# Patient Record
Sex: Female | Born: 1935
Health system: Southern US, Community
[De-identification: ages and names within clinical notes are randomized; demographics above are authoritative.]

## PROBLEM LIST (undated history)

## (undated) DIAGNOSIS — C50912 Malignant neoplasm of unspecified site of left female breast: Secondary | ICD-10-CM

## (undated) DIAGNOSIS — J449 Chronic obstructive pulmonary disease, unspecified: Secondary | ICD-10-CM

## (undated) DIAGNOSIS — Z853 Personal history of malignant neoplasm of breast: Secondary | ICD-10-CM

## (undated) DIAGNOSIS — I10 Essential (primary) hypertension: Secondary | ICD-10-CM

## (undated) HISTORY — PX: ABDOMINAL AORTIC ANEURYSM REPAIR: SUR1152

## (undated) HISTORY — PX: OTHER SURGICAL HISTORY: SHX169

---

## 2003-11-21 ENCOUNTER — Ambulatory Visit: Payer: Self-pay | Admitting: Oncology

## 2004-02-07 ENCOUNTER — Ambulatory Visit: Payer: Self-pay

## 2004-04-12 ENCOUNTER — Ambulatory Visit: Payer: Self-pay | Admitting: Internal Medicine

## 2004-07-02 ENCOUNTER — Ambulatory Visit: Payer: Self-pay | Admitting: Oncology

## 2004-07-04 ENCOUNTER — Ambulatory Visit: Payer: Self-pay | Admitting: Oncology

## 2004-10-16 ENCOUNTER — Ambulatory Visit: Payer: Self-pay | Admitting: Oncology

## 2005-02-25 ENCOUNTER — Ambulatory Visit: Payer: Self-pay

## 2005-05-22 ENCOUNTER — Ambulatory Visit: Payer: Self-pay | Admitting: Gastroenterology

## 2005-11-24 ENCOUNTER — Ambulatory Visit: Payer: Self-pay | Admitting: Surgery

## 2005-12-01 ENCOUNTER — Ambulatory Visit: Payer: Self-pay | Admitting: Surgery

## 2006-01-19 ENCOUNTER — Ambulatory Visit: Payer: Self-pay | Admitting: Internal Medicine

## 2007-05-03 ENCOUNTER — Ambulatory Visit: Payer: Self-pay | Admitting: Internal Medicine

## 2007-05-12 ENCOUNTER — Ambulatory Visit: Payer: Self-pay | Admitting: Internal Medicine

## 2007-05-25 ENCOUNTER — Ambulatory Visit: Payer: Self-pay | Admitting: Internal Medicine

## 2008-05-01 ENCOUNTER — Emergency Department: Payer: Self-pay | Admitting: Emergency Medicine

## 2009-05-31 ENCOUNTER — Ambulatory Visit: Payer: Self-pay | Admitting: Internal Medicine

## 2009-11-12 ENCOUNTER — Ambulatory Visit: Payer: Self-pay | Admitting: Internal Medicine

## 2011-02-25 DIAGNOSIS — R0609 Other forms of dyspnea: Secondary | ICD-10-CM | POA: Diagnosis not present

## 2011-02-25 DIAGNOSIS — I10 Essential (primary) hypertension: Secondary | ICD-10-CM | POA: Diagnosis not present

## 2011-02-25 DIAGNOSIS — E782 Mixed hyperlipidemia: Secondary | ICD-10-CM | POA: Diagnosis not present

## 2011-02-25 DIAGNOSIS — E039 Hypothyroidism, unspecified: Secondary | ICD-10-CM | POA: Diagnosis not present

## 2011-02-25 DIAGNOSIS — J309 Allergic rhinitis, unspecified: Secondary | ICD-10-CM | POA: Diagnosis not present

## 2011-02-25 DIAGNOSIS — R011 Cardiac murmur, unspecified: Secondary | ICD-10-CM | POA: Diagnosis not present

## 2011-02-25 DIAGNOSIS — R0989 Other specified symptoms and signs involving the circulatory and respiratory systems: Secondary | ICD-10-CM | POA: Diagnosis not present

## 2011-02-25 DIAGNOSIS — Z853 Personal history of malignant neoplasm of breast: Secondary | ICD-10-CM | POA: Diagnosis not present

## 2011-02-25 DIAGNOSIS — D518 Other vitamin B12 deficiency anemias: Secondary | ICD-10-CM | POA: Diagnosis not present

## 2011-02-28 DIAGNOSIS — I519 Heart disease, unspecified: Secondary | ICD-10-CM | POA: Diagnosis not present

## 2011-04-28 DIAGNOSIS — J449 Chronic obstructive pulmonary disease, unspecified: Secondary | ICD-10-CM | POA: Diagnosis not present

## 2011-04-28 DIAGNOSIS — I519 Heart disease, unspecified: Secondary | ICD-10-CM | POA: Diagnosis not present

## 2011-04-28 DIAGNOSIS — E782 Mixed hyperlipidemia: Secondary | ICD-10-CM | POA: Diagnosis not present

## 2011-04-28 DIAGNOSIS — J309 Allergic rhinitis, unspecified: Secondary | ICD-10-CM | POA: Diagnosis not present

## 2011-04-28 DIAGNOSIS — R0902 Hypoxemia: Secondary | ICD-10-CM | POA: Diagnosis not present

## 2011-08-14 DIAGNOSIS — E039 Hypothyroidism, unspecified: Secondary | ICD-10-CM | POA: Diagnosis not present

## 2011-08-14 DIAGNOSIS — L03119 Cellulitis of unspecified part of limb: Secondary | ICD-10-CM | POA: Diagnosis not present

## 2011-08-14 DIAGNOSIS — L02419 Cutaneous abscess of limb, unspecified: Secondary | ICD-10-CM | POA: Diagnosis not present

## 2011-08-14 DIAGNOSIS — I10 Essential (primary) hypertension: Secondary | ICD-10-CM | POA: Diagnosis not present

## 2011-08-14 DIAGNOSIS — E559 Vitamin D deficiency, unspecified: Secondary | ICD-10-CM | POA: Diagnosis not present

## 2011-08-14 DIAGNOSIS — D518 Other vitamin B12 deficiency anemias: Secondary | ICD-10-CM | POA: Diagnosis not present

## 2011-08-14 DIAGNOSIS — Z79899 Other long term (current) drug therapy: Secondary | ICD-10-CM | POA: Diagnosis not present

## 2011-09-15 DIAGNOSIS — E039 Hypothyroidism, unspecified: Secondary | ICD-10-CM | POA: Diagnosis not present

## 2011-09-15 DIAGNOSIS — D518 Other vitamin B12 deficiency anemias: Secondary | ICD-10-CM | POA: Diagnosis not present

## 2011-09-15 DIAGNOSIS — L02419 Cutaneous abscess of limb, unspecified: Secondary | ICD-10-CM | POA: Diagnosis not present

## 2011-09-15 DIAGNOSIS — I10 Essential (primary) hypertension: Secondary | ICD-10-CM | POA: Diagnosis not present

## 2011-09-29 DIAGNOSIS — D1739 Benign lipomatous neoplasm of skin and subcutaneous tissue of other sites: Secondary | ICD-10-CM | POA: Diagnosis not present

## 2011-09-29 DIAGNOSIS — D239 Other benign neoplasm of skin, unspecified: Secondary | ICD-10-CM | POA: Diagnosis not present

## 2011-09-29 DIAGNOSIS — D235 Other benign neoplasm of skin of trunk: Secondary | ICD-10-CM | POA: Diagnosis not present

## 2011-09-29 DIAGNOSIS — C4491 Basal cell carcinoma of skin, unspecified: Secondary | ICD-10-CM | POA: Diagnosis not present

## 2011-10-13 DIAGNOSIS — R42 Dizziness and giddiness: Secondary | ICD-10-CM | POA: Diagnosis not present

## 2011-10-13 DIAGNOSIS — R55 Syncope and collapse: Secondary | ICD-10-CM | POA: Diagnosis not present

## 2011-10-13 DIAGNOSIS — E538 Deficiency of other specified B group vitamins: Secondary | ICD-10-CM | POA: Diagnosis not present

## 2011-10-13 DIAGNOSIS — I9589 Other hypotension: Secondary | ICD-10-CM | POA: Diagnosis not present

## 2011-11-17 DIAGNOSIS — J449 Chronic obstructive pulmonary disease, unspecified: Secondary | ICD-10-CM | POA: Diagnosis not present

## 2011-11-17 DIAGNOSIS — R42 Dizziness and giddiness: Secondary | ICD-10-CM | POA: Diagnosis not present

## 2011-11-17 DIAGNOSIS — I10 Essential (primary) hypertension: Secondary | ICD-10-CM | POA: Diagnosis not present

## 2011-11-17 DIAGNOSIS — I9589 Other hypotension: Secondary | ICD-10-CM | POA: Diagnosis not present

## 2011-12-03 DIAGNOSIS — J069 Acute upper respiratory infection, unspecified: Secondary | ICD-10-CM | POA: Diagnosis not present

## 2011-12-03 DIAGNOSIS — R05 Cough: Secondary | ICD-10-CM | POA: Diagnosis not present

## 2011-12-03 DIAGNOSIS — R059 Cough, unspecified: Secondary | ICD-10-CM | POA: Diagnosis not present

## 2011-12-08 DIAGNOSIS — I10 Essential (primary) hypertension: Secondary | ICD-10-CM | POA: Diagnosis not present

## 2011-12-08 DIAGNOSIS — R42 Dizziness and giddiness: Secondary | ICD-10-CM | POA: Diagnosis not present

## 2011-12-08 DIAGNOSIS — J449 Chronic obstructive pulmonary disease, unspecified: Secondary | ICD-10-CM | POA: Diagnosis not present

## 2011-12-08 DIAGNOSIS — I9589 Other hypotension: Secondary | ICD-10-CM | POA: Diagnosis not present

## 2011-12-17 DIAGNOSIS — J449 Chronic obstructive pulmonary disease, unspecified: Secondary | ICD-10-CM | POA: Diagnosis not present

## 2011-12-17 DIAGNOSIS — J309 Allergic rhinitis, unspecified: Secondary | ICD-10-CM | POA: Diagnosis not present

## 2011-12-17 DIAGNOSIS — G4710 Hypersomnia, unspecified: Secondary | ICD-10-CM | POA: Diagnosis not present

## 2011-12-17 DIAGNOSIS — Z23 Encounter for immunization: Secondary | ICD-10-CM | POA: Diagnosis not present

## 2012-02-24 DIAGNOSIS — I10 Essential (primary) hypertension: Secondary | ICD-10-CM | POA: Diagnosis not present

## 2012-02-24 DIAGNOSIS — R609 Edema, unspecified: Secondary | ICD-10-CM | POA: Diagnosis not present

## 2012-03-04 DIAGNOSIS — I10 Essential (primary) hypertension: Secondary | ICD-10-CM | POA: Diagnosis not present

## 2012-03-04 DIAGNOSIS — J309 Allergic rhinitis, unspecified: Secondary | ICD-10-CM | POA: Diagnosis not present

## 2012-03-04 DIAGNOSIS — R609 Edema, unspecified: Secondary | ICD-10-CM | POA: Diagnosis not present

## 2012-03-04 DIAGNOSIS — Z79899 Other long term (current) drug therapy: Secondary | ICD-10-CM | POA: Diagnosis not present

## 2012-03-04 DIAGNOSIS — M1990 Unspecified osteoarthritis, unspecified site: Secondary | ICD-10-CM | POA: Diagnosis not present

## 2012-03-04 DIAGNOSIS — E039 Hypothyroidism, unspecified: Secondary | ICD-10-CM | POA: Diagnosis not present

## 2012-03-04 DIAGNOSIS — J449 Chronic obstructive pulmonary disease, unspecified: Secondary | ICD-10-CM | POA: Diagnosis not present

## 2012-05-13 DIAGNOSIS — H26499 Other secondary cataract, unspecified eye: Secondary | ICD-10-CM | POA: Diagnosis not present

## 2012-05-27 DIAGNOSIS — H40009 Preglaucoma, unspecified, unspecified eye: Secondary | ICD-10-CM | POA: Diagnosis not present

## 2012-07-13 ENCOUNTER — Ambulatory Visit: Payer: Self-pay | Admitting: Internal Medicine

## 2012-07-13 DIAGNOSIS — R928 Other abnormal and inconclusive findings on diagnostic imaging of breast: Secondary | ICD-10-CM | POA: Diagnosis not present

## 2012-07-13 DIAGNOSIS — Z9010 Acquired absence of unspecified breast and nipple: Secondary | ICD-10-CM | POA: Diagnosis not present

## 2012-07-13 DIAGNOSIS — N649 Disorder of breast, unspecified: Secondary | ICD-10-CM | POA: Diagnosis not present

## 2012-07-13 DIAGNOSIS — Z853 Personal history of malignant neoplasm of breast: Secondary | ICD-10-CM | POA: Diagnosis not present

## 2012-07-13 DIAGNOSIS — H4010X0 Unspecified open-angle glaucoma, stage unspecified: Secondary | ICD-10-CM | POA: Diagnosis not present

## 2012-07-13 DIAGNOSIS — L299 Pruritus, unspecified: Secondary | ICD-10-CM | POA: Diagnosis not present

## 2012-07-15 DIAGNOSIS — T148 Other injury of unspecified body region: Secondary | ICD-10-CM | POA: Diagnosis not present

## 2012-10-19 DIAGNOSIS — J069 Acute upper respiratory infection, unspecified: Secondary | ICD-10-CM | POA: Diagnosis not present

## 2012-10-19 DIAGNOSIS — J449 Chronic obstructive pulmonary disease, unspecified: Secondary | ICD-10-CM | POA: Diagnosis not present

## 2012-10-19 DIAGNOSIS — J060 Acute laryngopharyngitis: Secondary | ICD-10-CM | POA: Diagnosis not present

## 2012-10-19 DIAGNOSIS — I10 Essential (primary) hypertension: Secondary | ICD-10-CM | POA: Diagnosis not present

## 2012-10-19 DIAGNOSIS — R5381 Other malaise: Secondary | ICD-10-CM | POA: Diagnosis not present

## 2012-10-19 DIAGNOSIS — J441 Chronic obstructive pulmonary disease with (acute) exacerbation: Secondary | ICD-10-CM | POA: Diagnosis not present

## 2013-01-05 DIAGNOSIS — J069 Acute upper respiratory infection, unspecified: Secondary | ICD-10-CM | POA: Diagnosis not present

## 2013-01-05 DIAGNOSIS — H65199 Other acute nonsuppurative otitis media, unspecified ear: Secondary | ICD-10-CM | POA: Diagnosis not present

## 2013-01-19 DIAGNOSIS — I10 Essential (primary) hypertension: Secondary | ICD-10-CM | POA: Diagnosis not present

## 2013-01-19 DIAGNOSIS — J060 Acute laryngopharyngitis: Secondary | ICD-10-CM | POA: Diagnosis not present

## 2013-05-04 DIAGNOSIS — J309 Allergic rhinitis, unspecified: Secondary | ICD-10-CM | POA: Diagnosis not present

## 2013-05-04 DIAGNOSIS — R05 Cough: Secondary | ICD-10-CM | POA: Diagnosis not present

## 2013-05-04 DIAGNOSIS — J449 Chronic obstructive pulmonary disease, unspecified: Secondary | ICD-10-CM | POA: Diagnosis not present

## 2013-05-04 DIAGNOSIS — I10 Essential (primary) hypertension: Secondary | ICD-10-CM | POA: Diagnosis not present

## 2013-05-04 DIAGNOSIS — R059 Cough, unspecified: Secondary | ICD-10-CM | POA: Diagnosis not present

## 2013-05-04 DIAGNOSIS — J060 Acute laryngopharyngitis: Secondary | ICD-10-CM | POA: Diagnosis not present

## 2013-05-04 DIAGNOSIS — J069 Acute upper respiratory infection, unspecified: Secondary | ICD-10-CM | POA: Diagnosis not present

## 2013-09-20 DIAGNOSIS — Z79899 Other long term (current) drug therapy: Secondary | ICD-10-CM | POA: Diagnosis not present

## 2013-09-20 DIAGNOSIS — I10 Essential (primary) hypertension: Secondary | ICD-10-CM | POA: Diagnosis not present

## 2013-09-20 DIAGNOSIS — Z853 Personal history of malignant neoplasm of breast: Secondary | ICD-10-CM | POA: Diagnosis not present

## 2013-09-20 DIAGNOSIS — R55 Syncope and collapse: Secondary | ICD-10-CM | POA: Diagnosis not present

## 2013-09-20 DIAGNOSIS — E876 Hypokalemia: Secondary | ICD-10-CM | POA: Diagnosis not present

## 2013-11-04 DIAGNOSIS — Z23 Encounter for immunization: Secondary | ICD-10-CM | POA: Diagnosis not present

## 2013-12-09 ENCOUNTER — Ambulatory Visit: Payer: Self-pay

## 2013-12-09 DIAGNOSIS — S3992XA Unspecified injury of lower back, initial encounter: Secondary | ICD-10-CM | POA: Diagnosis not present

## 2013-12-09 DIAGNOSIS — M25551 Pain in right hip: Secondary | ICD-10-CM | POA: Diagnosis not present

## 2013-12-09 DIAGNOSIS — S79911A Unspecified injury of right hip, initial encounter: Secondary | ICD-10-CM | POA: Diagnosis not present

## 2013-12-09 DIAGNOSIS — I714 Abdominal aortic aneurysm, without rupture: Secondary | ICD-10-CM | POA: Diagnosis not present

## 2013-12-09 DIAGNOSIS — M545 Low back pain: Secondary | ICD-10-CM | POA: Diagnosis not present

## 2013-12-09 DIAGNOSIS — M8588 Other specified disorders of bone density and structure, other site: Secondary | ICD-10-CM | POA: Diagnosis not present

## 2013-12-13 DIAGNOSIS — I714 Abdominal aortic aneurysm, without rupture: Secondary | ICD-10-CM | POA: Diagnosis not present

## 2013-12-13 DIAGNOSIS — I8310 Varicose veins of unspecified lower extremity with inflammation: Secondary | ICD-10-CM | POA: Diagnosis not present

## 2013-12-15 ENCOUNTER — Ambulatory Visit: Payer: Self-pay | Admitting: Vascular Surgery

## 2013-12-15 DIAGNOSIS — J9811 Atelectasis: Secondary | ICD-10-CM | POA: Diagnosis not present

## 2013-12-15 DIAGNOSIS — I714 Abdominal aortic aneurysm, without rupture: Secondary | ICD-10-CM | POA: Diagnosis not present

## 2013-12-15 DIAGNOSIS — K5730 Diverticulosis of large intestine without perforation or abscess without bleeding: Secondary | ICD-10-CM | POA: Diagnosis not present

## 2013-12-15 DIAGNOSIS — N281 Cyst of kidney, acquired: Secondary | ICD-10-CM | POA: Diagnosis not present

## 2013-12-15 DIAGNOSIS — J479 Bronchiectasis, uncomplicated: Secondary | ICD-10-CM | POA: Diagnosis not present

## 2013-12-16 DIAGNOSIS — I714 Abdominal aortic aneurysm, without rupture: Secondary | ICD-10-CM | POA: Diagnosis not present

## 2013-12-16 DIAGNOSIS — R109 Unspecified abdominal pain: Secondary | ICD-10-CM | POA: Diagnosis not present

## 2013-12-19 ENCOUNTER — Ambulatory Visit: Payer: Self-pay | Admitting: Vascular Surgery

## 2013-12-19 DIAGNOSIS — Z01810 Encounter for preprocedural cardiovascular examination: Secondary | ICD-10-CM | POA: Diagnosis not present

## 2013-12-19 LAB — BASIC METABOLIC PANEL
Anion Gap: 4 — ABNORMAL LOW (ref 7–16)
BUN: 12 mg/dL (ref 7–18)
CALCIUM: 8.5 mg/dL (ref 8.5–10.1)
CHLORIDE: 104 mmol/L (ref 98–107)
CREATININE: 0.75 mg/dL (ref 0.60–1.30)
Co2: 32 mmol/L (ref 21–32)
GLUCOSE: 94 mg/dL (ref 65–99)
Osmolality: 279 (ref 275–301)
POTASSIUM: 3.9 mmol/L (ref 3.5–5.1)
Sodium: 140 mmol/L (ref 136–145)

## 2013-12-19 LAB — CBC
HCT: 46.2 % (ref 35.0–47.0)
HGB: 14.8 g/dL (ref 12.0–16.0)
MCH: 28.5 pg (ref 26.0–34.0)
MCHC: 32.1 g/dL (ref 32.0–36.0)
MCV: 89 fL (ref 80–100)
Platelet: 220 10*3/uL (ref 150–440)
RBC: 5.2 10*6/uL (ref 3.80–5.20)
RDW: 14.1 % (ref 11.5–14.5)
WBC: 6.4 10*3/uL (ref 3.6–11.0)

## 2013-12-22 ENCOUNTER — Inpatient Hospital Stay: Payer: Self-pay | Admitting: Vascular Surgery

## 2013-12-22 DIAGNOSIS — I10 Essential (primary) hypertension: Secondary | ICD-10-CM | POA: Diagnosis not present

## 2013-12-22 DIAGNOSIS — I714 Abdominal aortic aneurysm, without rupture: Secondary | ICD-10-CM | POA: Diagnosis not present

## 2013-12-22 DIAGNOSIS — E669 Obesity, unspecified: Secondary | ICD-10-CM | POA: Diagnosis present

## 2013-12-22 DIAGNOSIS — Z6833 Body mass index (BMI) 33.0-33.9, adult: Secondary | ICD-10-CM | POA: Diagnosis not present

## 2013-12-22 DIAGNOSIS — C50919 Malignant neoplasm of unspecified site of unspecified female breast: Secondary | ICD-10-CM | POA: Diagnosis not present

## 2013-12-23 LAB — COMPREHENSIVE METABOLIC PANEL
ALK PHOS: 67 U/L
AST: 38 U/L — AB (ref 15–37)
Albumin: 2.8 g/dL — ABNORMAL LOW (ref 3.4–5.0)
Anion Gap: 7 (ref 7–16)
BUN: 8 mg/dL (ref 7–18)
Bilirubin,Total: 0.7 mg/dL (ref 0.2–1.0)
CREATININE: 0.71 mg/dL (ref 0.60–1.30)
Calcium, Total: 7.6 mg/dL — ABNORMAL LOW (ref 8.5–10.1)
Chloride: 104 mmol/L (ref 98–107)
Co2: 29 mmol/L (ref 21–32)
EGFR (African American): >60
EGFR (Non-African Amer.): >60
Glucose: 102 mg/dL — ABNORMAL HIGH (ref 65–99)
OSMOLALITY: 278 (ref 275–301)
POTASSIUM: 4.1 mmol/L (ref 3.5–5.1)
SGPT (ALT): 18 U/L
SODIUM: 140 mmol/L (ref 136–145)
Total Protein: 5.6 g/dL — ABNORMAL LOW (ref 6.4–8.2)

## 2013-12-23 LAB — CBC WITH DIFFERENTIAL/PLATELET
Basophil #: 0 10*3/uL (ref 0.0–0.1)
Basophil %: 0.3 %
EOS ABS: 0 10*3/uL (ref 0.0–0.7)
Eosinophil %: 0.5 %
HCT: 37.6 % (ref 35.0–47.0)
HGB: 12.3 g/dL (ref 12.0–16.0)
LYMPHS ABS: 1 10*3/uL (ref 1.0–3.6)
Lymphocyte %: 11.4 %
MCH: 28.9 pg (ref 26.0–34.0)
MCHC: 32.8 g/dL (ref 32.0–36.0)
MCV: 88 fL (ref 80–100)
Monocyte #: 0.7 x10 3/mm (ref 0.2–0.9)
Monocyte %: 7.9 %
NEUTROS ABS: 7.1 10*3/uL — AB (ref 1.4–6.5)
NEUTROS PCT: 79.9 %
PLATELETS: 173 10*3/uL (ref 150–440)
RBC: 4.26 10*6/uL (ref 3.80–5.20)
RDW: 13.9 % (ref 11.5–14.5)
WBC: 9 10*3/uL (ref 3.6–11.0)

## 2013-12-23 LAB — APTT: Activated PTT: 34 secs (ref 23.6–35.9)

## 2013-12-23 LAB — MAGNESIUM: Magnesium: 1.8 mg/dL

## 2013-12-23 LAB — PROTIME-INR
INR: 1.1
Prothrombin Time: 13.6 secs (ref 11.5–14.7)

## 2013-12-23 LAB — PHOSPHORUS: Phosphorus: 3.3 mg/dL (ref 2.5–4.9)

## 2014-01-12 DIAGNOSIS — I519 Heart disease, unspecified: Secondary | ICD-10-CM | POA: Diagnosis not present

## 2014-01-12 DIAGNOSIS — E782 Mixed hyperlipidemia: Secondary | ICD-10-CM | POA: Diagnosis not present

## 2014-01-12 DIAGNOSIS — M150 Primary generalized (osteo)arthritis: Secondary | ICD-10-CM | POA: Diagnosis not present

## 2014-01-16 DIAGNOSIS — I714 Abdominal aortic aneurysm, without rupture: Secondary | ICD-10-CM | POA: Diagnosis not present

## 2014-03-08 DIAGNOSIS — J069 Acute upper respiratory infection, unspecified: Secondary | ICD-10-CM | POA: Diagnosis not present

## 2014-03-08 DIAGNOSIS — I10 Essential (primary) hypertension: Secondary | ICD-10-CM | POA: Diagnosis not present

## 2014-03-08 DIAGNOSIS — R05 Cough: Secondary | ICD-10-CM | POA: Diagnosis not present

## 2014-05-09 DIAGNOSIS — I714 Abdominal aortic aneurysm, without rupture: Secondary | ICD-10-CM | POA: Diagnosis not present

## 2014-05-27 NOTE — Op Note (Signed)
PATIENT NAME:  Gail Nelson, Gail Nelson MR#:  [id number] DATE OF BIRTH:  1923-07-24  DATE OF PROCEDURE:  12/22/2013  This is a co-surgeon note along with Dr. Delana Meyer who will dictating his portion of the procedure.   PREOPERATIVE DIAGNOSES:  1.  Abdominal aortic aneurysm.  2.  Hypertension.  3.  Breast cancer.   POSTOPERATIVE DIAGNOSES: 1.  Abdominal aortic aneurysm.  2.  Hypertension.  3.  Breast cancer.   PROCEDURES:  1.  Ultrasound guidance for vascular access to bilateral femoral arteries; right by Dr. Lucky Cowboy, left by Dr. Delana Meyer.  2.  Placement of catheters in the aorta from bilateral femoral approach, right by Dr. Lucky Cowboy, left by Dr. Delana Meyer.  3.  Placement of a Gore Excluder C3 endoprosthesis 28 mm diameter main body, 14 cm in length with a 14 mm diameter x 10 cm length left contralateral lag, co- surgeons.  4.   ProGlide closure device, bilateral femoral arteries, right by Dr. Lucky Cowboy, left by Dr. Delana Meyer.   ANESTHESIA: General.   ESTIMATED BLOOD LOSS: 200 mL   FLUOROSCOPY TIME: 6.7 minutes and 55 mL of contrast used.   INDICATION FOR PROCEDURE: This is an 81 year old female who was found to have about 5.4 cm infrarenal abdominal aortic aneurysm with acceptable anatomy for endovascular repair. She is brought in for prophylactic repair to avoid rupture. Risks and benefits were discussed. Informed consent was obtained.   DESCRIPTION OF PROCEDURE: The patient is brought to the vascular suite with help of our anesthesia colleagues providing general anesthetic. The abdomen and groins were sterilely prepped and draped and a sterile surgical field was created. The femoral arteries accessed under direct ultrasound guidance; I accessed the right while Dr. Delana Meyer accessed the left. Two ProGlide closure devices were placed in a preclose fashion on each side with 8 French sheaths then placed. The patient was then given 5000 units of intravenous heparin. Pigtail catheter was placed up the right and  aortogram was performed. This demonstrated the expected configuration of an infrarenal abdominal aortic aneurysm with the left renal artery being lower, the hypogastric arteries came several centimeters off the iliacs on each side. I then upsized to an 49 French sheath on the right over an Amplatz Super Stiff wire and selected a 28 mm diameter 14 cm in length main body with Dr. Delana Meyer going up the left. The Kumpe catheter for magnified image of the renal arteries was deployed just below the left renal artery, which was lowest. Dr. Ronalee Belts easily cannulated the contralateral limb without difficulty and confirmed successful cannulation with twirling the pigtail catheter in the main body. With a stiff wire placed through the pigtail catheter in the left femoral sheath he performed retrograde arteriogram. We selected a 14 mm diameter x 10 cm length left iliac limb and this came down to about 1 cm above the hypogastric artery. I completed the deployment, which also came down to about 1 cm above the hypogastric artery on the right. All junction points and seal zones were ironed out with the compliant balloon completion angiogram showed excellent location of the stent graft just below the left renal artery with brisk flow through both limbs. There may have been a small type II endoleak at completion but  this was somewhat indeterminate and is very small. At this point, the sheaths were removed. We completed the ProGlides closures on each side with good hemostasis. The skin incision was closed with a single 4-0 Monocryl and Dermabond was placed as a dressing. The patient  was awakened from anesthesia and taken to the recovery room in stable condition having tolerated the procedure well.    ____________________________ Algernon Huxley, MD jsd:bm D: 12/22/2013 14:17:25 ET T: 12/23/2013 01:49:02 ET JOB#: [id number]  cc: Algernon Huxley, MD, <Dictator> Algernon Huxley MD ELECTRONICALLY SIGNED 01/15/2014 11:41

## 2014-05-27 NOTE — Op Note (Signed)
PATIENT NAME:  Gail Nelson, Gail Nelson MR#:  [id number] DATE OF BIRTH:  [birth year]  DATE OF PROCEDURE:  12/22/2013  PREOPERATIVE DIAGNOSES: 1.  Abdominal aortic aneurysm.  2.  Hypertension.  3.  Breast cancer.  4.  Obesity.  POSTOPERATIVE DIAGNOSES:  1.  Abdominal aortic aneurysm.  2.  Hypertension.  3.  Breast cancer.  4.  Obesity.  PROCEDURES PERFORMED: 1.  Percutaneous ultrasound-guided access to the right and left common femoral arteries, right side by Dr. Lucky Cowboy, left side by myself.  2.  Introduction catheter into aorta from right groin approach by Dr. Lucky Cowboy and introduction catheter into aorta left groin approach by Dr. Delana Meyer.  3.  Exclusion of abdominal aortic aneurysm using the Gore Excluder C3 endoprosthesis 28 mm diameter main body with 14 mm diameter limbs.  4.  Preclose closure of the arteriotomies using the ProGlide devices bilaterally.   SURGEON: Hortencia Pilar, MD  ASSISTANT: Leotis Pain, MD  ANESTHESIA: General by endotracheal intubation.   FLUIDS: Per anesthesia.   ESTIMATED BLOOD LOSS: 200 mL.   FLUOROSCOPY TIME: 6.7 minutes.   CONTRAST USED: Isovue 55 mL total volume.   INDICATION: Gail Nelson is an 81 year old woman who was found to have a 5.4 cm infrarenal abdominal aortic aneurysm. Physical exam as well as CT analysis demonstrated adequate anatomy for endograft placement. The risks and benefits were reviewed. Alternative therapies were also discussed. She has agreed to proceed with repair of her abdominal aortic aneurysm to prevent lethal rupture.   DESCRIPTION OF PROCEDURE: The patient is taken to special procedure suite, placed in the supine position. After adequate general anesthesia is induced and appropriate invasive monitors are placed, she is positioned supine and prepped from just below the nipple line to just above the knees. She is then draped in a sterile fashion and an appropriate timeout is called.   With Dr. Lucky Cowboy working on the right, myself working  on the left, access to the common femoral arteries is obtained. Duplex ultrasound is utilized to avoid injury and because of lack of appropriate landmarks. Both femoral arteries are noted to be echolucent and pulsatile indicating patency. Images are recorded individually and access is obtained with Seldinger needle on the right and micropuncture needle on the left. Subsequently, sheaths are placed and once access has been secured 5000 units of heparin is given. It should be noted both needle punctures were made under direct ultrasound visualization.   Pigtail catheter is then advanced up the right side and aortogram is obtained. After verifying appropriate measurements, a Super Stiff wire is advanced through the pigtail on the right and subsequently the 16-French sheath is advanced up the right side. In the meantime, wire is advanced up the left and the sheath is exchanged for the 12-French sheath, which is positioned at the aortic bifurcation. KMP catheter is then advanced up the left and positioned just above the renals.   The Gore C3 Excluder main body is then opened and prepped on the back table. This is a 28 x 14 x 14 device. It is then advanced up the right side and deployed just below the left renal, which is the lower renal. Once the contralateral gate is opened, the Kumpe catheter is repositioned and using a Glidewire and a Kumpe catheter the gate is engaged, Glidewire is advanced, and subsequently the Kumpe is exchanged for a pigtail catheter, which is twirled in the main body verifying proper location. Super Stiff wire is then reintroduced. With the catheter in position, hand injection  of contrast is utilized and appropriate length measurements are verified and then a contralateral limb 14 x 10 is prepped on the back table and advanced over the stiff wire from the left. It is then deployed. Coda balloon is advanced up the left and used to seal the endoprosthesis. Coda balloon is then advanced up the  right and again used to gently dilate and seal the endoprosthesis. Pigtail catheter is advanced up the right side and bolus injection of contrast with delayed imaging is performed. A very late small type II endoleak is noted, but there are no type I/type III endoleaks and the endograft is in excellent position with rapid flow of contrast into the iliac arteries.   The left-sided sheath is then removed and the Perclose devices are deployed. Of note, these were put in in the Preclose fashion at the beginning of the case.  The right-sided sheath is then removed and the Perclose device is deployed.   Pressure is held over both groins for approximately 10 minutes and subsequently the small skin incisions are closed with a single Monocryl subcuticular and Dermabond is placed.   The patient tolerated the procedure well. There were no immediate complications. Sponge and needle counts are correct. She is taken to the recovery area in excellent condition.  ____________________________ Katha Cabal, MD ggs:sb D: 12/23/2013 09:14:00 ET T: 12/23/2013 14:38:54 ET JOB#: [id number]  cc: Katha Cabal, MD, <Dictator> Katha Cabal MD ELECTRONICALLY SIGNED 01/03/2014 13:00

## 2014-06-09 DIAGNOSIS — R011 Cardiac murmur, unspecified: Secondary | ICD-10-CM | POA: Diagnosis not present

## 2014-06-09 DIAGNOSIS — L309 Dermatitis, unspecified: Secondary | ICD-10-CM | POA: Diagnosis not present

## 2014-06-09 DIAGNOSIS — R42 Dizziness and giddiness: Secondary | ICD-10-CM | POA: Diagnosis not present

## 2014-06-09 DIAGNOSIS — I10 Essential (primary) hypertension: Secondary | ICD-10-CM | POA: Diagnosis not present

## 2014-08-10 DIAGNOSIS — I10 Essential (primary) hypertension: Secondary | ICD-10-CM | POA: Diagnosis not present

## 2014-08-10 DIAGNOSIS — M79606 Pain in leg, unspecified: Secondary | ICD-10-CM | POA: Diagnosis not present

## 2014-08-10 DIAGNOSIS — E538 Deficiency of other specified B group vitamins: Secondary | ICD-10-CM | POA: Diagnosis not present

## 2014-08-10 DIAGNOSIS — M7660 Achilles tendinitis, unspecified leg: Secondary | ICD-10-CM | POA: Diagnosis not present

## 2014-08-10 DIAGNOSIS — R21 Rash and other nonspecific skin eruption: Secondary | ICD-10-CM | POA: Diagnosis not present

## 2014-08-10 DIAGNOSIS — T8069XA Other serum reaction due to other serum, initial encounter: Secondary | ICD-10-CM | POA: Diagnosis not present

## 2014-08-22 DIAGNOSIS — Z23 Encounter for immunization: Secondary | ICD-10-CM | POA: Diagnosis not present

## 2014-11-10 DIAGNOSIS — I714 Abdominal aortic aneurysm, without rupture: Secondary | ICD-10-CM | POA: Diagnosis not present

## 2014-11-10 DIAGNOSIS — I10 Essential (primary) hypertension: Secondary | ICD-10-CM | POA: Diagnosis not present

## 2015-01-11 DIAGNOSIS — I10 Essential (primary) hypertension: Secondary | ICD-10-CM | POA: Diagnosis not present

## 2015-01-11 DIAGNOSIS — R609 Edema, unspecified: Secondary | ICD-10-CM | POA: Diagnosis not present

## 2015-01-11 DIAGNOSIS — Z0001 Encounter for general adult medical examination with abnormal findings: Secondary | ICD-10-CM | POA: Diagnosis not present

## 2015-04-03 DIAGNOSIS — I10 Essential (primary) hypertension: Secondary | ICD-10-CM | POA: Diagnosis not present

## 2015-04-03 DIAGNOSIS — R079 Chest pain, unspecified: Secondary | ICD-10-CM | POA: Diagnosis not present

## 2015-04-03 DIAGNOSIS — J309 Allergic rhinitis, unspecified: Secondary | ICD-10-CM | POA: Diagnosis not present

## 2015-04-04 DIAGNOSIS — Z961 Presence of intraocular lens: Secondary | ICD-10-CM | POA: Diagnosis not present

## 2015-06-18 DIAGNOSIS — I714 Abdominal aortic aneurysm, without rupture: Secondary | ICD-10-CM | POA: Diagnosis not present

## 2015-06-18 DIAGNOSIS — I6523 Occlusion and stenosis of bilateral carotid arteries: Secondary | ICD-10-CM | POA: Diagnosis not present

## 2015-06-18 DIAGNOSIS — I10 Essential (primary) hypertension: Secondary | ICD-10-CM | POA: Diagnosis not present

## 2015-07-12 DIAGNOSIS — E669 Obesity, unspecified: Secondary | ICD-10-CM | POA: Diagnosis not present

## 2015-07-12 DIAGNOSIS — E782 Mixed hyperlipidemia: Secondary | ICD-10-CM | POA: Diagnosis not present

## 2015-07-12 DIAGNOSIS — D519 Vitamin B12 deficiency anemia, unspecified: Secondary | ICD-10-CM | POA: Diagnosis not present

## 2015-07-12 DIAGNOSIS — E039 Hypothyroidism, unspecified: Secondary | ICD-10-CM | POA: Diagnosis not present

## 2015-07-12 DIAGNOSIS — I10 Essential (primary) hypertension: Secondary | ICD-10-CM | POA: Diagnosis not present

## 2015-07-12 DIAGNOSIS — J309 Allergic rhinitis, unspecified: Secondary | ICD-10-CM | POA: Diagnosis not present

## 2015-08-13 DIAGNOSIS — E782 Mixed hyperlipidemia: Secondary | ICD-10-CM | POA: Diagnosis not present

## 2015-08-13 DIAGNOSIS — I10 Essential (primary) hypertension: Secondary | ICD-10-CM | POA: Diagnosis not present

## 2015-08-13 DIAGNOSIS — E039 Hypothyroidism, unspecified: Secondary | ICD-10-CM | POA: Diagnosis not present

## 2015-09-12 ENCOUNTER — Ambulatory Visit
Admission: RE | Admit: 2015-09-12 | Discharge: 2015-09-12 | Disposition: A | Discharge status: Home / Self Care | Payer: Medicare HMO | Source: Ambulatory Visit | Attending: Physician Assistant | Admitting: Physician Assistant

## 2015-09-12 ENCOUNTER — Other Ambulatory Visit: Payer: Self-pay | Admitting: Physician Assistant

## 2015-09-12 DIAGNOSIS — R05 Cough: Secondary | ICD-10-CM | POA: Diagnosis not present

## 2015-09-12 DIAGNOSIS — J069 Acute upper respiratory infection, unspecified: Secondary | ICD-10-CM | POA: Diagnosis not present

## 2015-09-12 DIAGNOSIS — R059 Cough, unspecified: Secondary | ICD-10-CM

## 2015-09-20 DIAGNOSIS — D485 Neoplasm of uncertain behavior of skin: Secondary | ICD-10-CM | POA: Diagnosis not present

## 2015-09-20 DIAGNOSIS — J069 Acute upper respiratory infection, unspecified: Secondary | ICD-10-CM | POA: Diagnosis not present

## 2015-09-20 DIAGNOSIS — J301 Allergic rhinitis due to pollen: Secondary | ICD-10-CM | POA: Diagnosis not present

## 2015-11-19 DIAGNOSIS — L720 Epidermal cyst: Secondary | ICD-10-CM | POA: Diagnosis not present

## 2015-11-19 DIAGNOSIS — L570 Actinic keratosis: Secondary | ICD-10-CM | POA: Diagnosis not present

## 2015-11-19 DIAGNOSIS — L821 Other seborrheic keratosis: Secondary | ICD-10-CM | POA: Diagnosis not present

## 2015-11-19 DIAGNOSIS — L700 Acne vulgaris: Secondary | ICD-10-CM | POA: Diagnosis not present

## 2015-12-18 DIAGNOSIS — L720 Epidermal cyst: Secondary | ICD-10-CM | POA: Diagnosis not present

## 2016-01-08 DIAGNOSIS — J069 Acute upper respiratory infection, unspecified: Secondary | ICD-10-CM | POA: Diagnosis not present

## 2016-01-08 DIAGNOSIS — J340 Abscess, furuncle and carbuncle of nose: Secondary | ICD-10-CM | POA: Diagnosis not present

## 2016-01-08 DIAGNOSIS — R05 Cough: Secondary | ICD-10-CM | POA: Diagnosis not present

## 2016-01-17 DIAGNOSIS — R21 Rash and other nonspecific skin eruption: Secondary | ICD-10-CM | POA: Diagnosis not present

## 2016-01-17 DIAGNOSIS — E669 Obesity, unspecified: Secondary | ICD-10-CM | POA: Diagnosis not present

## 2016-01-17 DIAGNOSIS — I10 Essential (primary) hypertension: Secondary | ICD-10-CM | POA: Diagnosis not present

## 2016-01-17 DIAGNOSIS — Z853 Personal history of malignant neoplasm of breast: Secondary | ICD-10-CM | POA: Diagnosis not present

## 2016-01-17 DIAGNOSIS — Z0001 Encounter for general adult medical examination with abnormal findings: Secondary | ICD-10-CM | POA: Diagnosis not present

## 2016-02-26 DIAGNOSIS — E039 Hypothyroidism, unspecified: Secondary | ICD-10-CM | POA: Diagnosis not present

## 2016-02-26 DIAGNOSIS — I10 Essential (primary) hypertension: Secondary | ICD-10-CM | POA: Diagnosis not present

## 2016-03-17 ENCOUNTER — Ambulatory Visit
Admission: RE | Admit: 2016-03-17 | Discharge: 2016-03-17 | Disposition: A | Discharge status: Home / Self Care | Payer: Medicare HMO | Source: Ambulatory Visit | Attending: Nurse Practitioner | Admitting: Nurse Practitioner

## 2016-03-17 ENCOUNTER — Other Ambulatory Visit: Payer: Self-pay | Admitting: Nurse Practitioner

## 2016-03-17 DIAGNOSIS — J069 Acute upper respiratory infection, unspecified: Secondary | ICD-10-CM | POA: Diagnosis not present

## 2016-03-17 DIAGNOSIS — R05 Cough: Secondary | ICD-10-CM

## 2016-03-17 DIAGNOSIS — R059 Cough, unspecified: Secondary | ICD-10-CM

## 2016-03-17 DIAGNOSIS — R0602 Shortness of breath: Secondary | ICD-10-CM

## 2016-03-17 DIAGNOSIS — J441 Chronic obstructive pulmonary disease with (acute) exacerbation: Secondary | ICD-10-CM | POA: Diagnosis not present

## 2016-03-17 DIAGNOSIS — R918 Other nonspecific abnormal finding of lung field: Secondary | ICD-10-CM | POA: Insufficient documentation

## 2016-03-17 DIAGNOSIS — E039 Hypothyroidism, unspecified: Secondary | ICD-10-CM | POA: Diagnosis not present

## 2016-03-17 DIAGNOSIS — I700 Atherosclerosis of aorta: Secondary | ICD-10-CM | POA: Insufficient documentation

## 2016-03-17 DIAGNOSIS — I10 Essential (primary) hypertension: Secondary | ICD-10-CM | POA: Diagnosis not present

## 2016-03-28 ENCOUNTER — Emergency Department: Payer: Medicare HMO

## 2016-03-28 ENCOUNTER — Observation Stay
Admission: EM | Admit: 2016-03-28 | Discharge: 2016-03-29 | Disposition: A | Discharge status: Home / Self Care | Payer: Medicare HMO | Attending: Internal Medicine | Admitting: Internal Medicine

## 2016-03-28 DIAGNOSIS — I10 Essential (primary) hypertension: Secondary | ICD-10-CM | POA: Diagnosis not present

## 2016-03-28 DIAGNOSIS — R0602 Shortness of breath: Secondary | ICD-10-CM | POA: Diagnosis not present

## 2016-03-28 DIAGNOSIS — J9601 Acute respiratory failure with hypoxia: Secondary | ICD-10-CM | POA: Insufficient documentation

## 2016-03-28 DIAGNOSIS — Z79899 Other long term (current) drug therapy: Secondary | ICD-10-CM | POA: Diagnosis not present

## 2016-03-28 DIAGNOSIS — R05 Cough: Secondary | ICD-10-CM | POA: Diagnosis not present

## 2016-03-28 DIAGNOSIS — J441 Chronic obstructive pulmonary disease with (acute) exacerbation: Principal | ICD-10-CM | POA: Insufficient documentation

## 2016-03-28 DIAGNOSIS — J449 Chronic obstructive pulmonary disease, unspecified: Secondary | ICD-10-CM | POA: Diagnosis present

## 2016-03-28 HISTORY — DX: Chronic obstructive pulmonary disease, unspecified: J44.9

## 2016-03-28 HISTORY — DX: Personal history of malignant neoplasm of breast: Z85.3

## 2016-03-28 HISTORY — DX: Essential (primary) hypertension: I10

## 2016-03-28 LAB — BASIC METABOLIC PANEL
ANION GAP: 8 (ref 5–15)
BUN: 14 mg/dL (ref 6–20)
CALCIUM: 8.8 mg/dL — AB (ref 8.9–10.3)
CO2: 31 mmol/L (ref 22–32)
Chloride: 101 mmol/L (ref 101–111)
Creatinine, Ser: 0.75 mg/dL (ref 0.44–1.00)
Glucose, Bld: 103 mg/dL — ABNORMAL HIGH (ref 65–99)
POTASSIUM: 4.1 mmol/L (ref 3.5–5.1)
SODIUM: 140 mmol/L (ref 135–145)

## 2016-03-28 LAB — TROPONIN I

## 2016-03-28 LAB — CBC
HEMATOCRIT: 44.9 % (ref 35.0–47.0)
HEMOGLOBIN: 14.5 g/dL (ref 12.0–16.0)
MCH: 28.1 pg (ref 26.0–34.0)
MCHC: 32.4 g/dL (ref 32.0–36.0)
MCV: 86.9 fL (ref 80.0–100.0)
Platelets: 188 10*3/uL (ref 150–440)
RBC: 5.17 MIL/uL (ref 3.80–5.20)
RDW: 15.2 % — ABNORMAL HIGH (ref 11.5–14.5)
WBC: 9.1 10*3/uL (ref 3.6–11.0)

## 2016-03-28 MED ORDER — IPRATROPIUM-ALBUTEROL 0.5-2.5 (3) MG/3ML IN SOLN
3.0000 mL | Freq: Once | RESPIRATORY_TRACT | Status: AC
  Administered 2016-03-28: 3 mL via RESPIRATORY_TRACT
  Filled 2016-03-28: qty 3

## 2016-03-28 MED ORDER — ONDANSETRON HCL 4 MG PO TABS: 4.0000 mg | ORAL_TABLET | Freq: Four times a day (QID) | ORAL | Status: DC | PRN

## 2016-03-28 MED ORDER — ONDANSETRON HCL 4 MG/2ML IJ SOLN: 4.0000 mg | Freq: Four times a day (QID) | INTRAMUSCULAR | Status: DC | PRN

## 2016-03-28 MED ORDER — ACETAMINOPHEN 325 MG PO TABS: 650.0000 mg | ORAL_TABLET | Freq: Four times a day (QID) | ORAL | Status: DC | PRN

## 2016-03-28 MED ORDER — ACETAMINOPHEN 650 MG RE SUPP: 650.0000 mg | Freq: Four times a day (QID) | RECTAL | Status: DC | PRN

## 2016-03-28 MED ORDER — IPRATROPIUM-ALBUTEROL 0.5-2.5 (3) MG/3ML IN SOLN: 3.0000 mL | RESPIRATORY_TRACT | Status: DC | PRN

## 2016-03-28 MED ORDER — ENOXAPARIN SODIUM 40 MG/0.4ML ~~LOC~~ SOLN: 40.0000 mg | SUBCUTANEOUS | Status: DC

## 2016-03-28 MED ORDER — METHYLPREDNISOLONE SODIUM SUCC 125 MG IJ SOLR
60.0000 mg | Freq: Four times a day (QID) | INTRAMUSCULAR | Status: DC
  Administered 2016-03-29 (×3): 60 mg via INTRAVENOUS
  Filled 2016-03-28 (×3): qty 2

## 2016-03-28 MED ORDER — METHYLPREDNISOLONE SODIUM SUCC 125 MG IJ SOLR
125.0000 mg | Freq: Once | INTRAMUSCULAR | Status: AC
  Administered 2016-03-28: 125 mg via INTRAVENOUS
  Filled 2016-03-28: qty 2

## 2016-03-28 MED ORDER — SODIUM CHLORIDE 0.9% FLUSH
3.0000 mL | Freq: Two times a day (BID) | INTRAVENOUS | Status: DC
  Administered 2016-03-29 (×2): 3 mL via INTRAVENOUS

## 2016-03-28 NOTE — ED Notes (Signed)
Patient completed breathing treatment and put on room air to see saturation level.  Patient started at 96% room air and decreased to 82% room air.  Patient denied shortness of breath, but placed back on 2L nasal cannula and increased to 93%. MD notified.

## 2016-03-28 NOTE — ED Notes (Signed)
Patient given meal tray.

## 2016-03-28 NOTE — ED Notes (Signed)
Admitting MD at bedside.

## 2016-03-28 NOTE — ED Notes (Signed)
Pt placed on 2L [place]

## 2016-03-28 NOTE — ED Notes (Signed)
ED Provider at bedside. 

## 2016-03-28 NOTE — ED Triage Notes (Signed)
Pt dx with pneumonia of 2/12 and has been taking antibiotics. Pt reports increase in SOB over past 2 days. Pt finished Levaquin on 2/21. Pt has hx of COPD. Pt hx of wearing oxygen.

## 2016-03-28 NOTE — ED Notes (Signed)
Patient transported to X-ray 

## 2016-03-28 NOTE — ED Notes (Signed)
Patient ambulated in hallway on room air and decreased saturation to 80%.  Patient placed back in room on 2L.  Denies any shortness of breath at this time.

## 2016-03-28 NOTE — H&P (Signed)
Gail Nelson NAME: Gail Nelson    MR#:  [id number]  DATE OF BIRTH:  1923-11-01  DATE OF ADMISSION:  03/28/2016  PRIMARY CARE PHYSICIAN: Lavera Guise, MD   REQUESTING/REFERRING PHYSICIAN: Alfred Levins, MD  CHIEF COMPLAINT:   Chief Complaint  Patient presents with  . Shortness of Breath    HISTORY OF PRESENT ILLNESS:  Gail Nelson  is a 83 y.o. female who presents with Rest. Patient states that she has a history of COPD, and has been on home oxygen the past, but had improved enough to not need that anymore. Over the past 2 days she's had significant episodes of profound shortness of breath. Here in the ED her workup does not show pneumonia or other infection, and is consistent with COPD exacerbation. Hospitalists were called for admission.  PAST MEDICAL HISTORY:   Past Medical History:  Diagnosis Date  . COPD (chronic obstructive pulmonary disease) ([place])   . History of breast cancer   . HTN (hypertension)     PAST SURGICAL HISTORY:   Past Surgical History:  Procedure Laterality Date  . ABDOMINAL AORTIC ANEURYSM REPAIR      SOCIAL HISTORY:   Social History  Substance Use Topics  . Smoking status: Never Smoker  . Smokeless tobacco: Never Used  . Alcohol use No    FAMILY HISTORY:   Family History  Problem Relation Age of Onset  . Hypertension Other     DRUG ALLERGIES:   Allergies  Allergen Reactions  . Biaxin [Clarithromycin]   . Penicillins     MEDICATIONS AT HOME:   Prior to Admission medications   Medication Sig Start Date End Date Taking? Authorizing Provider  albuterol (PROVENTIL) (2.5 MG/3ML) 0.083% nebulizer solution Take 2.5 mg by nebulization every 6 (six) hours as needed for wheezing or shortness of breath.   Yes Historical Provider, MD  carvedilol (COREG) 3.125 MG tablet Take 3.125 mg by mouth daily. At 1900   Yes Historical Provider, MD  furosemide (LASIX) 20 MG tablet Take 20 mg  by mouth daily.   Yes Historical Provider, MD  loratadine (CLARITIN) 10 MG tablet Take 10 mg by mouth daily as needed for allergies.   Yes Historical Provider, MD  Manganese 10 MG TABS Take 1 tablet by mouth daily.   Yes Historical Provider, MD  potassium gluconate 595 (99 K) MG TABS tablet Take 595 mg by mouth daily.   Yes Historical Provider, MD  vitamin B-12 (CYANOCOBALAMIN) 1000 MCG tablet Take 1,000 mcg by mouth daily.   Yes Historical Provider, MD  vitamin E 400 UNIT capsule Take 400 Units by mouth daily.   Yes Historical Provider, MD    REVIEW OF SYSTEMS:  Review of Systems  Constitutional: Negative for chills, fever, malaise/fatigue and weight loss.  HENT: Negative for ear pain, hearing loss and tinnitus.   Eyes: Negative for blurred vision, double vision, pain and redness.  Respiratory: Positive for shortness of breath. Negative for cough and hemoptysis.   Cardiovascular: Negative for chest pain, palpitations, orthopnea and leg swelling.  Gastrointestinal: Negative for abdominal pain, constipation, diarrhea, nausea and vomiting.  Genitourinary: Negative for dysuria, frequency and hematuria.  Musculoskeletal: Negative for back pain, joint pain and neck pain.  Skin:       No acne, rash, or lesions  Neurological: Negative for dizziness, tremors, focal weakness and weakness.  Endo/Heme/Allergies: Negative for polydipsia. Does not bruise/bleed easily.  Psychiatric/Behavioral: Negative for depression. The patient is not  nervous/anxious and does not have insomnia.      VITAL SIGNS:   Vitals:   03/28/16 1915 03/28/16 1930 03/28/16 1937 03/28/16 2049  BP: (!) 155/69 (!) 165/107  (!) 156/82  Pulse: 80 94 98 100  Resp: 13 (!) 25 (!) 27 20  Temp:      TempSrc:      SpO2: 96% (!) 87% 94% 98%  Weight:      Height:       Wt Readings from Last 3 Encounters:  03/28/16 93.4 kg (206 lb)    PHYSICAL EXAMINATION:  Physical Exam  Vitals reviewed. Constitutional: She is oriented to  person, place, and time. She appears well-developed and well-nourished. No distress.  HENT:  Head: Normocephalic and atraumatic.  Mouth/Throat: Oropharynx is clear and moist.  Eyes: Conjunctivae and EOM are normal. Pupils are equal, round, and reactive to light. No scleral icterus.  Neck: Normal range of motion. Neck supple. No JVD present. No thyromegaly present.  Cardiovascular: Normal rate, regular rhythm and intact distal pulses.  Exam reveals no gallop and no friction rub.   No murmur heard. Respiratory: Effort normal. No respiratory distress. She has wheezes (very mild end expiratory wheezes). She has no rales.  GI: Soft. Bowel sounds are normal. She exhibits no distension. There is no tenderness.  Musculoskeletal: Normal range of motion. She exhibits no edema.  No arthritis, no gout  Lymphadenopathy:    She has no cervical adenopathy.  Neurological: She is alert and oriented to person, place, and time. No cranial nerve deficit.  No dysarthria, no aphasia  Skin: Skin is warm and dry. No rash noted. No erythema.  Psychiatric: She has a normal mood and affect. Her behavior is normal. Judgment and thought content normal.    LABORATORY PANEL:   CBC  Recent Labs Lab 03/28/16 1732  WBC 9.1  HGB 14.5  HCT 44.9  PLT 188   ------------------------------------------------------------------------------------------------------------------  Chemistries   Recent Labs Lab 03/28/16 1732  NA 140  K 4.1  CL 101  CO2 31  GLUCOSE 103*  BUN 14  CREATININE 0.75  CALCIUM 8.8*   ------------------------------------------------------------------------------------------------------------------  Cardiac Enzymes  Recent Labs Lab 03/28/16 1732  TROPONINI <0.03   ------------------------------------------------------------------------------------------------------------------  RADIOLOGY:  Dg Chest 2 View  Result Date: 03/28/2016 CLINICAL DATA:  Acute onset of shortness of breath  and cough. Initial encounter. EXAM: CHEST  2 VIEW COMPARISON:  Chest radiograph performed 03/17/2016 FINDINGS: The lungs are well-aerated. Mild bibasilar atelectasis or scarring is noted. There is no evidence of pleural effusion or pneumothorax. The heart is normal in size; the mediastinal contour is within normal limits. No acute osseous abnormalities are seen. Clips are noted within the right upper quadrant, reflecting prior cholecystectomy. IMPRESSION: Mild bibasilar atelectasis or scarring noted. Lungs otherwise clear. Electronically Signed   By: Garald Balding M.D.   On: 03/28/2016 18:18    EKG:   Orders placed or performed during the hospital encounter of 03/28/16  . EKG 12-Lead  . EKG 12-Lead  . ED EKG  . ED EKG    IMPRESSION AND PLAN:  Principal Problem:   COPD exacerbation ([place]) - steroids, when necessary duo nebs, when necessary antitussives, continue home meds Active Problems:   HTN (hypertension) - continue home meds  All the records are reviewed and case discussed with ED provider. Management plans discussed with the patient and/or family.  DVT PROPHYLAXIS: SubQ lovenox  GI PROPHYLAXIS: None  ADMISSION STATUS: Observation  CODE STATUS: Full Code Status History  This patient does not have a recorded code status. Please follow your organizational policy for patients in this situation.      TOTAL TIME TAKING CARE OF THIS PATIENT: 40 minutes.    Yusuf Yu FIELDING 03/28/2016, 9:49 PM  [facility]  Office  [phone]  CC: Primary care physician; Lavera Guise, MD

## 2016-03-28 NOTE — ED Provider Notes (Signed)
[facility] Emergency Department Provider Note  ____________________________________________  Time seen: Approximately 6:24 PM  I have reviewed the triage vital signs and the nursing notes.   HISTORY  Chief Complaint Shortness of Breath   HPI Gail Nelson is a 83 y.o. female a history of COPD who presents for evaluation of shortness of breath. Patient reports that 10 days ago she was diagnosed with pneumonia. She was placed on Levaquin and reports that she fully recovered. She reports she had her energy back, her breathing was back to normal and she was feeling well. She finished antibiotics 2 days ago. Yesterday she reports that she was feeling a little bit more short of breath however her breathing treatments were helping. Today she started feeling progressively worse shortness of breath, her albuterol nebulizer wasn't working anymore, she is coughing again and now bringing up clear sputum. No chills or fever, no chest pain, no nausea, no vomiting, no diarrhea. She called her primary care doctor who recommended she come to the emergency room to be reevaluated. Her last breathing treatment was 2 hours ago. She denies personal or family history of blood clots, chest pain, hemoptysis, leg pain or swelling.  Past Medical History:  Diagnosis Date  . COPD (chronic obstructive pulmonary disease) (HCC)     There are no active problems to display for this patient.   History reviewed. No pertinent surgical history.  Prior to Admission medications   Medication Sig Start Date End Date Taking? Authorizing Provider  albuterol (PROVENTIL) (2.5 MG/3ML) 0.083% nebulizer solution Take 2.5 mg by nebulization every 6 (six) hours as needed for wheezing or shortness of breath.   Yes Historical Provider, MD  carvedilol (COREG) 3.125 MG tablet Take 3.125 mg by mouth daily. At 1900   Yes Historical Provider, MD  furosemide (LASIX) 20 MG tablet Take 20 mg by mouth daily.   Yes  Historical Provider, MD  loratadine (CLARITIN) 10 MG tablet Take 10 mg by mouth daily as needed for allergies.   Yes Historical Provider, MD  Manganese 10 MG TABS Take 1 tablet by mouth daily.   Yes Historical Provider, MD  potassium gluconate 595 (99 K) MG TABS tablet Take 595 mg by mouth daily.   Yes Historical Provider, MD  vitamin B-12 (CYANOCOBALAMIN) 1000 MCG tablet Take 1,000 mcg by mouth daily.   Yes Historical Provider, MD  vitamin E 400 UNIT capsule Take 400 Units by mouth daily.   Yes Historical Provider, MD    Allergies Biaxin [clarithromycin] and Penicillins  No family history on file.  Social History Social History  Substance Use Topics  . Smoking status: Never Smoker  . Smokeless tobacco: Never Used  . Alcohol use No    Review of Systems  Constitutional: Negative for fever. Eyes: Negative for visual changes. ENT: Negative for sore throat. Neck: No neck pain  Cardiovascular: Negative for chest pain. Respiratory: + shortness of breath and cough Gastrointestinal: Negative for abdominal pain, vomiting or diarrhea. Genitourinary: Negative for dysuria. Musculoskeletal: Negative for back pain. Skin: Negative for rash. Neurological: Negative for headaches, weakness or numbness. Psych: No SI or HI  ____________________________________________   PHYSICAL EXAM:  VITAL SIGNS: ED Triage Vitals [03/28/16 1725]  Enc Vitals Group     BP (!) 163/70     Pulse Rate 87     Resp 20     Temp 98.4 F (36.9 C)     Temp Source Oral     SpO2 (!) 89 %  Weight 206 lb (93.4 kg)     Height '5\' 3"'$  (1.6 m)     Head Circumference      Peak Flow      Pain Score      Pain Loc      Pain Edu?      Excl. in [place]?     Constitutional: Alert and oriented. Well appearing and in no apparent distress. HEENT:      Head: Normocephalic and atraumatic.         Eyes: Conjunctivae are normal. Sclera is non-icteric. EOMI. PERRL      Mouth/Throat: Mucous membranes are moist.       Neck:  Supple with no signs of meningismus. Cardiovascular: Regular rate and rhythm. No murmurs, gallops, or rubs. 2+ symmetrical distal pulses are present in all extremities. No JVD. Respiratory: Increased work of breathing and hypoxia, room air which improved on 2 L nasal cannula. Slightly decreased air movement with no crackles or wheezing Gastrointestinal: Soft, non tender, and non distended with positive bowel sounds. No rebound or guarding. Musculoskeletal: Nontender with normal range of motion in all extremities. No edema, cyanosis, or erythema of extremities. Neurologic: Normal speech and language. Face is symmetric. Moving all extremities. No gross focal neurologic deficits are appreciated. Skin: Skin is warm, dry and intact. No rash noted. Psychiatric: Mood and affect are normal. Speech and behavior are normal.  ____________________________________________   LABS (all labs ordered are listed, but only abnormal results are displayed)  Labs Reviewed  BASIC METABOLIC PANEL - Abnormal; Notable for the following:       Result Value   Glucose, Bld 103 (*)    Calcium 8.8 (*)    All other components within normal limits  CBC - Abnormal; Notable for the following:    RDW 15.2 (*)    All other components within normal limits  TROPONIN I   ____________________________________________  EKG   ED ECG REPORT I, Rudene Re, the attending physician, personally viewed and interpreted this ECG.  Normal sinus rhythm, rate of 85, normal intervals, left ventricular hypertrophy, no ST elevations or depressions, T-wave inversion on inferior leads.  ____________________________________________  RADIOLOGY  CXR: Negative  ____________________________________________   PROCEDURES  Procedure(s) performed: None Procedures Critical Care performed:  Yes  CRITICAL CARE Performed by: Rudene Re  ?  Total critical care time: 35 min  Critical care time was exclusive of separately  billable procedures and treating other patients.  Critical care was necessary to treat or prevent imminent or life-threatening deterioration.  Critical care was time spent personally by me on the following activities: development of treatment plan with patient and/or surrogate as well as nursing, discussions with consultants, evaluation of patient's response to treatment, examination of patient, obtaining history from patient or surrogate, ordering and performing treatments and interventions, ordering and review of laboratory studies, ordering and review of radiographic studies, pulse oximetry and re-evaluation of patient's condition.  ____________________________________________   INITIAL IMPRESSION / ASSESSMENT AND PLAN / ED COURSE   83 y.o. female a history of COPD who presents for evaluation of shortness of breath and cough. Patient reports that she fully recovered from her recent pneumonia, finish and buttocks 2 days ago and now having progressively worsening shortness of breath and productive cough. No fever therefore low suspicion for influenza. She has slightly diminished air movement but no wheezing and had a breathing treatment 2 hours ago. She was hypoxic on room air and that resolved with 2 L nasal cannula. We'll give  her a DuoNeb treatment x 2, start her on Solu-Medrol, repeat chest x-ray to evaluate for recurrent pneumonia. We'll get an EKG and troponin although low suspicion for ACS. She has no leg pain or swelling, no hemoptysis, no prior history of PE and has had progressively worsening shortness of breath with wheezing and a productive cough making PE less likely clinically at this time.   ED COURSE:  Patient received 3 DuoNeb treatments and feels markedly improved however with ambulation she decided to 80%. She is moving great air at this time and requiring 2 L nasal cannula at rest. Will admit to the hospitalist service.  Pertinent labs & imaging results that were available  during my care of the patient were reviewed by me and considered in my medical decision making (see chart for details).    ____________________________________________   FINAL CLINICAL IMPRESSION(S) / ED DIAGNOSES  Final diagnoses:  Acute respiratory failure with hypoxia (HCC)  COPD exacerbation (HCC)      NEW MEDICATIONS STARTED DURING THIS VISIT:  New Prescriptions   No medications on file     Note:  This document was prepared using Dragon voice recognition software and may include unintentional dictation errors.    Rudene Re, MD 03/28/16 2017

## 2016-03-28 NOTE — ED Notes (Signed)
Lab called and informed to add on troponin to patient's existing blood work. Lab verbalized understanding of this information.

## 2016-03-29 DIAGNOSIS — J441 Chronic obstructive pulmonary disease with (acute) exacerbation: Secondary | ICD-10-CM | POA: Diagnosis not present

## 2016-03-29 DIAGNOSIS — J449 Chronic obstructive pulmonary disease, unspecified: Secondary | ICD-10-CM | POA: Diagnosis not present

## 2016-03-29 DIAGNOSIS — I10 Essential (primary) hypertension: Secondary | ICD-10-CM | POA: Diagnosis not present

## 2016-03-29 LAB — CBC
HEMATOCRIT: 43.7 % (ref 35.0–47.0)
HEMOGLOBIN: 14.4 g/dL (ref 12.0–16.0)
MCH: 28.3 pg (ref 26.0–34.0)
MCHC: 32.9 g/dL (ref 32.0–36.0)
MCV: 86.1 fL (ref 80.0–100.0)
Platelets: 206 10*3/uL (ref 150–440)
RBC: 5.08 MIL/uL (ref 3.80–5.20)
RDW: 15.1 % — ABNORMAL HIGH (ref 11.5–14.5)
WBC: 6 10*3/uL (ref 3.6–11.0)

## 2016-03-29 LAB — BASIC METABOLIC PANEL
ANION GAP: 8 (ref 5–15)
BUN: 12 mg/dL (ref 6–20)
CHLORIDE: 102 mmol/L (ref 101–111)
CO2: 29 mmol/L (ref 22–32)
Calcium: 8.7 mg/dL — ABNORMAL LOW (ref 8.9–10.3)
Creatinine, Ser: 0.7 mg/dL (ref 0.44–1.00)
GFR calc Af Amer: >60 mL/min (ref >60)
GFR calc non Af Amer: >60 mL/min (ref >60)
GLUCOSE: 166 mg/dL — AB (ref 65–99)
POTASSIUM: 4.1 mmol/L (ref 3.5–5.1)
Sodium: 139 mmol/L (ref 135–145)

## 2016-03-29 MED ORDER — FUROSEMIDE 20 MG PO TABS
20.0000 mg | ORAL_TABLET | Freq: Every day | ORAL | Status: DC
  Administered 2016-03-29: 20 mg via ORAL
  Filled 2016-03-29: qty 1

## 2016-03-29 MED ORDER — PREDNISONE 50 MG PO TABS: 50.0000 mg | ORAL_TABLET | Freq: Every day | ORAL | 0 refills | Status: DC

## 2016-03-29 MED ORDER — CARVEDILOL 3.125 MG PO TABS
3.1250 mg | ORAL_TABLET | Freq: Every day | ORAL | Status: DC
  Administered 2016-03-29: 10:00:00 3.125 mg via ORAL
  Filled 2016-03-29: qty 1

## 2016-03-29 MED ORDER — LORATADINE 10 MG PO TABS: 10.0000 mg | ORAL_TABLET | Freq: Every day | ORAL | Status: DC | PRN

## 2016-03-29 MED ORDER — POTASSIUM GLUCONATE 595 (99 K) MG PO TABS: 595.0000 mg | ORAL_TABLET | Freq: Every day | ORAL | Status: DC

## 2016-03-29 NOTE — Care Management Note (Signed)
Case Management Note  Patient Details  Name: Gail Nelson MRN: [id number] Date of Birth: 13-Nov-1923  Subjective/Objective:      Spoke with [facility] RN and request 02 saturations of At rest without 02, exercise without 02, exercise with 02 to determine whether Ms Devito qualifies for new home oxygen.               Action/Plan:   Expected Discharge Date:  03/29/16               Expected Discharge Plan:     In-House Referral:     Discharge planning Services     Post Acute Care Choice:    Choice offered to:     DME Arranged:    DME Agency:     HH Arranged:    HH Agency:     Status of Service:     If discussed at [employer], dates discussed:    Additional Comments:  Arnetha Silverthorne A, RN 03/29/2016, 12:26 PM

## 2016-03-29 NOTE — Care Management Obs Status (Signed)
[place] NOTIFICATION   Patient Details  Name: Gail Nelson MRN: [id number] Date of Birth: 1923/06/01   Medicare Observation Status Notification Given:  No (discharge order in less than 24 hours)    Ival Bible, RN 03/29/2016, 12:24 PM

## 2016-03-29 NOTE — Care Management Note (Signed)
Case Management Note  Patient Details  Name: JACQUELEEN PULVER MRN: [id number] Date of Birth: 07-Jul-1923  Subjective/Objective:        Mrs Gorey has used [employer] in the past and requested that her new home oxygen today also be provided by [employer]. Mandy at [facility] was notified and a portable 02 tank will be delivered to Mrs Hoppe in her hospital room today and a new oxygen set up will be delivered to her home today.             Action/Plan:   Expected Discharge Date:  03/29/16               Expected Discharge Plan:     In-House Referral:     Discharge planning Services     Post Acute Care Choice:    Choice offered to:     DME Arranged:    DME Agency:     HH Arranged:    HH Agency:     Status of Service:     If discussed at [employer], dates discussed:    Additional Comments:  Shandale Malak A, RN 03/29/2016, 12:44 PM

## 2016-03-29 NOTE — Progress Notes (Signed)
Completed oxygen saturation assessment.   At rest on 2L O2 pts oxygen saturations are 97%  At rest on RA pts oxygen saturations are 91%  Walking on RA pt's oxygen saturations dropped to 85%  Walking on 2L of O2 pt's oxygen saturation came up to 87%  Walking on 3L of O2 pt's oxygen saturation came up to 93%  Returning to rest on 2L O2 pt's oxygen saturation came back to 95%

## 2016-03-29 NOTE — Discharge Instructions (Signed)
Resume diet and activity as before ° ° °

## 2016-03-29 NOTE — Progress Notes (Signed)
Patient is to be discharged home today. Patient is in no acute distress at this time, and assessment is unchanged from this morning. Patient's IV is out, discharge paperwork has been discussed with patient/family and there are no questions or concerns at this time. [employer] came and dropped off pt's portable O2 tank. Lennette Bihari (the [employer]) stated he will go to the patient's home to set up her oxygen concentrator this afternoon. Patient will be accompanied downstairs by staff and family via wheelchair.

## 2016-03-31 NOTE — Discharge Summary (Signed)
[place] [facility] [place] NAME: Gail Nelson    MR#:  [id number]  DATE OF BIRTH:  May 13, 1923  DATE OF ADMISSION:  03/28/2016 ADMITTING PHYSICIAN: Lance Coon, MD  DATE OF DISCHARGE: 03/29/2016  3:32 PM  PRIMARY CARE PHYSICIAN: Lavera Guise, MD   ADMISSION DIAGNOSIS:  COPD exacerbation ([place]) [J44.1] Acute respiratory failure with hypoxia ([place]) [J96.01]  DISCHARGE DIAGNOSIS:  Principal Problem:   COPD exacerbation ([place]) Active Problems:   HTN (hypertension)   SECONDARY DIAGNOSIS:   Past Medical History:  Diagnosis Date  . COPD (chronic obstructive pulmonary disease) ([place])   . History of breast cancer   . HTN (hypertension)      ADMITTING HISTORY  HISTORY OF PRESENT ILLNESS:  Gail Nelson  is a 83 y.o. female who presents with Rest. Patient states that she has a history of COPD, and has been on home oxygen the past, but had improved enough to not need that anymore. Over the past 2 days she's had significant episodes of profound shortness of breath. Here in the ED her workup does not show pneumonia or other infection, and is consistent with COPD exacerbation. Hospitalists were called for admission.   HOSPITAL COURSE:   * COPD Exacerbation * Acute hypoxic respiratory failure * Hypertension  Treated with oxygen, IV steroids and breathing treatments in the hospital. IV steroids added. Vision improved well and by day of discharge she felt back to her baseline with her breathing. Continue to need oxygen on ambulation which has been set up through case management. Patient is being discharged home on oral prednisone and nebulizer treatment. 24-hour home oxygen at 3 L/m.  Follow-up with primary care physician in one week.  CONSULTS OBTAINED:    DRUG ALLERGIES:   Allergies  Allergen Reactions  . Biaxin [Clarithromycin]   . Penicillins     DISCHARGE MEDICATIONS:   Discharge Medication List as of 03/29/2016  1:59 PM    START  taking these medications   Details  predniSONE (DELTASONE) 50 MG tablet Take 1 tablet (50 mg total) by mouth daily with breakfast., Starting Sun 03/30/2016, Normal      CONTINUE these medications which have NOT CHANGED   Details  albuterol (PROVENTIL) (2.5 MG/3ML) 0.083% nebulizer solution Take 2.5 mg by nebulization every 6 (six) hours as needed for wheezing or shortness of breath., Historical Med    carvedilol (COREG) 3.125 MG tablet Take 3.125 mg by mouth daily. At 1900, Historical Med    furosemide (LASIX) 20 MG tablet Take 20 mg by mouth daily., Historical Med    loratadine (CLARITIN) 10 MG tablet Take 10 mg by mouth daily as needed for allergies., Historical Med    Manganese 10 MG TABS Take 1 tablet by mouth daily., Historical Med    potassium gluconate 595 (99 K) MG TABS tablet Take 595 mg by mouth daily., Historical Med    vitamin B-12 (CYANOCOBALAMIN) 1000 MCG tablet Take 1,000 mcg by mouth daily., Historical Med    vitamin E 400 UNIT capsule Take 400 Units by mouth daily., Historical Med        Today   VITAL SIGNS:  Blood pressure 137/71, pulse 99, temperature 98.2 F (36.8 C), temperature source Oral, resp. rate 20, height '5\' 3"'$  (1.6 m), weight 93.4 kg (206 lb), SpO2 93 %.  I/O:  No intake or output data in the 24 hours ending 03/31/16 1229  PHYSICAL EXAMINATION:  Physical Exam  GENERAL:  83 y.o.-year-old patient lying in  the bed with no acute distress. Obese LUNGS: Normal breath sounds bilaterally, no wheezing, rales,rhonchi or crepitation. No use of accessory muscles of respiration.  CARDIOVASCULAR: S1, S2 normal. No murmurs, rubs, or gallops.  ABDOMEN: Soft, non-tender, non-distended. Bowel sounds present. No organomegaly or mass.  NEUROLOGIC: Moves all 4 extremities. PSYCHIATRIC: The patient is alert and oriented x 3.  SKIN: No obvious rash, lesion, or ulcer.   DATA REVIEW:   CBC  Recent Labs Lab 03/29/16 0551  WBC 6.0  HGB 14.4  HCT 43.7  PLT 206     Chemistries   Recent Labs Lab 03/29/16 0551  NA 139  K 4.1  CL 102  CO2 29  GLUCOSE 166*  BUN 12  CREATININE 0.70  CALCIUM 8.7*    Cardiac Enzymes  Recent Labs Lab 03/28/16 1732  TROPONINI <0.03    Microbiology Results  No results found for this or any previous visit.  RADIOLOGY:  No results found.  Follow up with PCP in 1 week.  Management plans discussed with the patient, family and they are in agreement.  CODE STATUS:  Code Status History    Date Active Date Inactive Code Status Order ID Comments User Context   03/28/2016 10:53 PM 03/29/2016  6:33 PM Full Code [id number]  Lance Coon, MD Inpatient      TOTAL TIME TAKING CARE OF THIS PATIENT ON DAY OF DISCHARGE: more than 30 minutes.   Hillary Bow R M.D on 03/31/2016 at 12:29 PM  Between 7am to 6pm - Pager - [phone]  After 6pm go to [email or web address] - password [email or web address] [facility]  Office  [phone]  CC: Primary care physician; Lavera Guise, MD  Note: This dictation was prepared with Dragon dictation along with smaller phrase technology. Any transcriptional errors that result from this process are unintentional.

## 2016-04-01 DIAGNOSIS — J449 Chronic obstructive pulmonary disease, unspecified: Secondary | ICD-10-CM | POA: Diagnosis not present

## 2016-04-01 DIAGNOSIS — R0602 Shortness of breath: Secondary | ICD-10-CM | POA: Diagnosis not present

## 2016-04-01 DIAGNOSIS — R0902 Hypoxemia: Secondary | ICD-10-CM | POA: Diagnosis not present

## 2016-04-14 DIAGNOSIS — J441 Chronic obstructive pulmonary disease with (acute) exacerbation: Secondary | ICD-10-CM | POA: Diagnosis not present

## 2016-04-22 DIAGNOSIS — J449 Chronic obstructive pulmonary disease, unspecified: Secondary | ICD-10-CM | POA: Diagnosis not present

## 2016-04-22 DIAGNOSIS — R0602 Shortness of breath: Secondary | ICD-10-CM | POA: Diagnosis not present

## 2016-04-22 DIAGNOSIS — R0902 Hypoxemia: Secondary | ICD-10-CM | POA: Diagnosis not present

## 2016-04-26 DIAGNOSIS — J449 Chronic obstructive pulmonary disease, unspecified: Secondary | ICD-10-CM | POA: Diagnosis not present

## 2016-04-30 DIAGNOSIS — R0602 Shortness of breath: Secondary | ICD-10-CM | POA: Diagnosis not present

## 2016-05-07 DIAGNOSIS — R0602 Shortness of breath: Secondary | ICD-10-CM | POA: Diagnosis not present

## 2016-05-13 DIAGNOSIS — J449 Chronic obstructive pulmonary disease, unspecified: Secondary | ICD-10-CM | POA: Diagnosis not present

## 2016-05-13 DIAGNOSIS — R609 Edema, unspecified: Secondary | ICD-10-CM | POA: Diagnosis not present

## 2016-05-13 DIAGNOSIS — R0602 Shortness of breath: Secondary | ICD-10-CM | POA: Diagnosis not present

## 2016-05-15 DIAGNOSIS — J441 Chronic obstructive pulmonary disease with (acute) exacerbation: Secondary | ICD-10-CM | POA: Diagnosis not present

## 2016-05-27 DIAGNOSIS — J449 Chronic obstructive pulmonary disease, unspecified: Secondary | ICD-10-CM | POA: Diagnosis not present

## 2016-06-14 DIAGNOSIS — J441 Chronic obstructive pulmonary disease with (acute) exacerbation: Secondary | ICD-10-CM | POA: Diagnosis not present

## 2016-06-17 ENCOUNTER — Other Ambulatory Visit (INDEPENDENT_AMBULATORY_CARE_PROVIDER_SITE_OTHER): Payer: Self-pay

## 2016-06-17 ENCOUNTER — Ambulatory Visit (INDEPENDENT_AMBULATORY_CARE_PROVIDER_SITE_OTHER): Payer: Self-pay | Admitting: Vascular Surgery

## 2016-06-17 ENCOUNTER — Encounter (INDEPENDENT_AMBULATORY_CARE_PROVIDER_SITE_OTHER): Payer: Self-pay

## 2016-06-26 DIAGNOSIS — J449 Chronic obstructive pulmonary disease, unspecified: Secondary | ICD-10-CM | POA: Diagnosis not present

## 2016-07-07 DIAGNOSIS — R0602 Shortness of breath: Secondary | ICD-10-CM | POA: Diagnosis not present

## 2016-07-07 DIAGNOSIS — J449 Chronic obstructive pulmonary disease, unspecified: Secondary | ICD-10-CM | POA: Diagnosis not present

## 2016-07-07 DIAGNOSIS — R0902 Hypoxemia: Secondary | ICD-10-CM | POA: Diagnosis not present

## 2016-07-15 DIAGNOSIS — J441 Chronic obstructive pulmonary disease with (acute) exacerbation: Secondary | ICD-10-CM | POA: Diagnosis not present

## 2016-07-22 DIAGNOSIS — I5032 Chronic diastolic (congestive) heart failure: Secondary | ICD-10-CM | POA: Diagnosis not present

## 2016-07-22 DIAGNOSIS — R079 Chest pain, unspecified: Secondary | ICD-10-CM | POA: Diagnosis not present

## 2016-07-22 DIAGNOSIS — M6508 Abscess of tendon sheath, other site: Secondary | ICD-10-CM | POA: Diagnosis not present

## 2016-07-22 DIAGNOSIS — J449 Chronic obstructive pulmonary disease, unspecified: Secondary | ICD-10-CM | POA: Diagnosis not present

## 2016-07-27 DIAGNOSIS — J449 Chronic obstructive pulmonary disease, unspecified: Secondary | ICD-10-CM | POA: Diagnosis not present

## 2016-08-04 DIAGNOSIS — L820 Inflamed seborrheic keratosis: Secondary | ICD-10-CM | POA: Diagnosis not present

## 2016-08-04 DIAGNOSIS — L821 Other seborrheic keratosis: Secondary | ICD-10-CM | POA: Diagnosis not present

## 2016-08-14 DIAGNOSIS — J441 Chronic obstructive pulmonary disease with (acute) exacerbation: Secondary | ICD-10-CM | POA: Diagnosis not present

## 2016-08-18 ENCOUNTER — Other Ambulatory Visit (INDEPENDENT_AMBULATORY_CARE_PROVIDER_SITE_OTHER): Payer: Self-pay | Admitting: Vascular Surgery

## 2016-08-18 DIAGNOSIS — Z9889 Other specified postprocedural states: Secondary | ICD-10-CM

## 2016-08-18 DIAGNOSIS — H538 Other visual disturbances: Secondary | ICD-10-CM | POA: Diagnosis not present

## 2016-08-18 DIAGNOSIS — I739 Peripheral vascular disease, unspecified: Secondary | ICD-10-CM

## 2016-08-20 ENCOUNTER — Ambulatory Visit (INDEPENDENT_AMBULATORY_CARE_PROVIDER_SITE_OTHER): Payer: Medicare HMO

## 2016-08-20 ENCOUNTER — Ambulatory Visit (INDEPENDENT_AMBULATORY_CARE_PROVIDER_SITE_OTHER): Payer: Self-pay | Admitting: Vascular Surgery

## 2016-08-20 DIAGNOSIS — Z9889 Other specified postprocedural states: Secondary | ICD-10-CM

## 2016-08-20 DIAGNOSIS — I739 Peripheral vascular disease, unspecified: Secondary | ICD-10-CM

## 2016-08-25 ENCOUNTER — Encounter (INDEPENDENT_AMBULATORY_CARE_PROVIDER_SITE_OTHER): Payer: Self-pay | Admitting: Vascular Surgery

## 2016-08-26 DIAGNOSIS — J449 Chronic obstructive pulmonary disease, unspecified: Secondary | ICD-10-CM | POA: Diagnosis not present

## 2016-09-14 DIAGNOSIS — J441 Chronic obstructive pulmonary disease with (acute) exacerbation: Secondary | ICD-10-CM | POA: Diagnosis not present

## 2016-09-26 DIAGNOSIS — J449 Chronic obstructive pulmonary disease, unspecified: Secondary | ICD-10-CM | POA: Diagnosis not present

## 2016-09-29 DIAGNOSIS — Z961 Presence of intraocular lens: Secondary | ICD-10-CM | POA: Diagnosis not present

## 2016-10-08 DIAGNOSIS — L821 Other seborrheic keratosis: Secondary | ICD-10-CM | POA: Diagnosis not present

## 2016-10-08 DIAGNOSIS — L820 Inflamed seborrheic keratosis: Secondary | ICD-10-CM | POA: Diagnosis not present

## 2016-10-15 DIAGNOSIS — J441 Chronic obstructive pulmonary disease with (acute) exacerbation: Secondary | ICD-10-CM | POA: Diagnosis not present

## 2016-10-27 DIAGNOSIS — J449 Chronic obstructive pulmonary disease, unspecified: Secondary | ICD-10-CM | POA: Diagnosis not present

## 2016-11-14 DIAGNOSIS — J441 Chronic obstructive pulmonary disease with (acute) exacerbation: Secondary | ICD-10-CM | POA: Diagnosis not present

## 2016-11-20 DIAGNOSIS — I10 Essential (primary) hypertension: Secondary | ICD-10-CM | POA: Diagnosis not present

## 2016-11-20 DIAGNOSIS — J449 Chronic obstructive pulmonary disease, unspecified: Secondary | ICD-10-CM | POA: Diagnosis not present

## 2016-11-20 DIAGNOSIS — Z9981 Dependence on supplemental oxygen: Secondary | ICD-10-CM | POA: Diagnosis not present

## 2016-11-20 DIAGNOSIS — L309 Dermatitis, unspecified: Secondary | ICD-10-CM | POA: Diagnosis not present

## 2016-11-26 DIAGNOSIS — J449 Chronic obstructive pulmonary disease, unspecified: Secondary | ICD-10-CM | POA: Diagnosis not present

## 2016-12-08 DIAGNOSIS — J449 Chronic obstructive pulmonary disease, unspecified: Secondary | ICD-10-CM | POA: Diagnosis not present

## 2016-12-08 DIAGNOSIS — J439 Emphysema, unspecified: Secondary | ICD-10-CM | POA: Diagnosis not present

## 2016-12-08 DIAGNOSIS — Z9981 Dependence on supplemental oxygen: Secondary | ICD-10-CM | POA: Diagnosis not present

## 2016-12-15 DIAGNOSIS — L821 Other seborrheic keratosis: Secondary | ICD-10-CM | POA: Diagnosis not present

## 2016-12-15 DIAGNOSIS — L820 Inflamed seborrheic keratosis: Secondary | ICD-10-CM | POA: Diagnosis not present

## 2016-12-15 DIAGNOSIS — J441 Chronic obstructive pulmonary disease with (acute) exacerbation: Secondary | ICD-10-CM | POA: Diagnosis not present

## 2016-12-27 DIAGNOSIS — J449 Chronic obstructive pulmonary disease, unspecified: Secondary | ICD-10-CM | POA: Diagnosis not present

## 2017-01-26 DIAGNOSIS — J449 Chronic obstructive pulmonary disease, unspecified: Secondary | ICD-10-CM | POA: Diagnosis not present

## 2017-02-19 ENCOUNTER — Ambulatory Visit: Payer: Medicare HMO | Admitting: Nurse Practitioner

## 2017-02-19 ENCOUNTER — Encounter: Payer: Self-pay | Admitting: Nurse Practitioner

## 2017-02-19 VITALS — BP 140/80 | HR 90 | Resp 16 | Ht 64.5 in | Wt 210.0 lb

## 2017-02-19 DIAGNOSIS — J4489 Other specified chronic obstructive pulmonary disease: Secondary | ICD-10-CM

## 2017-02-19 DIAGNOSIS — R300 Dysuria: Secondary | ICD-10-CM

## 2017-02-19 DIAGNOSIS — J439 Emphysema, unspecified: Secondary | ICD-10-CM | POA: Insufficient documentation

## 2017-02-19 DIAGNOSIS — I10 Essential (primary) hypertension: Secondary | ICD-10-CM

## 2017-02-19 DIAGNOSIS — Z0001 Encounter for general adult medical examination with abnormal findings: Secondary | ICD-10-CM

## 2017-02-19 DIAGNOSIS — J449 Chronic obstructive pulmonary disease, unspecified: Secondary | ICD-10-CM | POA: Insufficient documentation

## 2017-02-19 MED ORDER — UMECLIDINIUM BROMIDE 62.5 MCG/INH IN AEPB: 1.0000 | INHALATION_SPRAY | Freq: Every day | RESPIRATORY_TRACT | 0 refills | Status: DC

## 2017-02-19 NOTE — Progress Notes (Signed)
[facility] [place], [place] [address]  Internal MEDICINE  Office Visit Note  Patient Name: Gail Nelson  [id number]  [id number]  Date of Service: 03/04/2017  No chief complaint on file.    The patient is here for routine health maintenance exam. She does have history of asthma. She got a letter from her insurance, stating they would no longer pay for her Uvalda. This medication has helped her to breathe better than any other inhaler she has used in the past.  Has had a little bit of "pins and needles" in her belly since having popcorn this week.  She states that she is otherwise feeling well.    Pt is here for routine health maintenance examination  Current Medication: Outpatient Encounter Medications as of 02/19/2017  Medication Sig  . albuterol (PROVENTIL) (2.5 MG/3ML) 0.083% nebulizer solution Take 2.5 mg by nebulization every 6 (six) hours as needed for wheezing or shortness of breath.  . betamethasone dipropionate (DIPROLENE) 0.05 % cream Apply topically 2 (two) times daily.  . carvedilol (COREG) 3.125 MG tablet Take 3.125 mg by mouth daily. At 1900  . furosemide (LASIX) 20 MG tablet Take 20 mg by mouth daily.  . Manganese 10 MG TABS Take 1 tablet by mouth daily.  . potassium gluconate 595 (99 K) MG TABS tablet Take 595 mg by mouth daily.  . vitamin B-12 (CYANOCOBALAMIN) 1000 MCG tablet Take 1,000 mcg by mouth daily.  . vitamin E 400 UNIT capsule Take 400 Units by mouth daily.  . [DISCONTINUED] tiotropium (SPIRIVA) 18 MCG inhalation capsule Place 18 mcg into inhaler and inhale daily. 1 cap in inhaler qam  . loratadine (CLARITIN) 10 MG tablet Take 10 mg by mouth daily as needed for allergies.  . predniSONE (DELTASONE) 50 MG tablet Take 1 tablet (50 mg total) by mouth daily with breakfast. (Patient not taking: Reported on 02/19/2017)  . umeclidinium bromide (INCRUSE ELLIPTA) 62.5 MCG/INH AEPB Inhale 1 puff into the lungs daily.   No  facility-administered encounter medications on file as of 02/19/2017.     Surgical History: Past Surgical History:  Procedure Laterality Date  . ABDOMINAL AORTIC ANEURYSM REPAIR    . cataract surgery Bilateral   . mastectomy Left     Medical History: Past Medical History:  Diagnosis Date  . COPD (chronic obstructive pulmonary disease) ([place])   . History of breast cancer   . HTN (hypertension)     Family History: Family History  Problem Relation Age of Onset  . Hypertension Other       Review of Systems  Constitutional: Negative for activity change, chills, fatigue and unexpected weight change.  HENT: Negative for congestion, postnasal drip, rhinorrhea, sneezing and sore throat.   Eyes: Negative.  Negative for redness.  Respiratory: Negative for cough, chest tightness and shortness of breath.        Very mild shortness of breath with exertion.   Cardiovascular: Negative for chest pain and palpitations.  Gastrointestinal: Negative for abdominal pain, constipation, diarrhea, nausea and vomiting.  Genitourinary: Negative for dysuria and frequency.  Musculoskeletal: Negative for arthralgias, back pain, joint swelling and neck pain.  Skin: Negative for rash.  Neurological: Negative.  Negative for tremors and numbness.  Hematological: Negative for adenopathy. Does not bruise/bleed easily.  Psychiatric/Behavioral: Negative for behavioral problems (Depression), sleep disturbance and suicidal ideas. The patient is not nervous/anxious.      Today's Vitals   02/19/17 1512  BP: 140/80  Pulse: 90  Resp: 16  SpO2: 94%  Weight: 210 lb (95.3 kg)  Height: 5' 4.5" (1.638 m)   Physical Exam  Constitutional: She is oriented to person, place, and time. She appears well-developed and well-nourished. No distress.  HENT:  Head: Normocephalic and atraumatic.  Mouth/Throat: Oropharynx is clear and moist. No oropharyngeal exudate.  Eyes: EOM are normal. Pupils are equal, round, and  reactive to light.  Neck: Normal range of motion. Neck supple. No JVD present. Carotid bruit is not present. No tracheal deviation present. No thyromegaly present.  Cardiovascular: Normal rate, regular rhythm, normal heart sounds and intact distal pulses. Exam reveals no gallop and no friction rub.  No murmur heard. Pulmonary/Chest: Effort normal and breath sounds normal. No respiratory distress. She has no wheezes. She has no rales. She exhibits no tenderness.  Abdominal: Soft. Bowel sounds are normal. There is no tenderness.  Musculoskeletal: Normal range of motion.  Lymphadenopathy:    She has no cervical adenopathy.  Neurological: She is alert and oriented to person, place, and time. No cranial nerve deficit.  Skin: Skin is warm and dry. She is not diaphoretic.  Psychiatric: She has a normal mood and affect. Her behavior is normal. Judgment and thought content normal.  Nursing note and vitals reviewed.    Assessment/Plan:  1. Encounter for general adult medical examination with abnormal findings Annual wellness visit today.  2. Obstructive chronic bronchitis without exacerbation (HCC) Stable. Continue inhalers and respiratory medications as prescribed.  - umeclidinium bromide (INCRUSE ELLIPTA) 62.5 MCG/INH AEPB; Inhale 1 puff into the lungs daily.  Dispense: 1 each; Refill: 0  3. Essential hypertension Stable. Continue bp medication as prescribed.  4. Dysuria - Urinalysis, Routine w reflex microscopic  General Counseling: Shonda verbalizes understanding of the findings of todays visit and agrees with plan of treatment. I have discussed any further diagnostic evaluation that may be needed or ordered today. We also reviewed her medications today. she has been encouraged to call the office with any questions or concerns that should arise related to todays visit.  This patient was seen by Leretha Pol, FNP- C in Collaboration with Dr Lavera Guise as a part of collaborative care  agreement    Orders Placed This Encounter  Procedures  . Urinalysis, Routine w reflex microscopic     Time spent: [place], MD  Internal Medicine

## 2017-02-19 NOTE — Patient Instructions (Addendum)

## 2017-02-20 LAB — URINALYSIS, ROUTINE W REFLEX MICROSCOPIC
BILIRUBIN UA: NEGATIVE
Glucose, UA: NEGATIVE
KETONES UA: NEGATIVE
LEUKOCYTES UA: NEGATIVE
Nitrite, UA: NEGATIVE
PROTEIN UA: NEGATIVE
RBC UA: NEGATIVE
SPEC GRAV UA: 1.016 (ref 1.005–1.030)
Urobilinogen, Ur: 0.2 mg/dL (ref 0.2–1.0)
pH, UA: 5 (ref 5.0–7.5)

## 2017-02-26 DIAGNOSIS — J449 Chronic obstructive pulmonary disease, unspecified: Secondary | ICD-10-CM | POA: Diagnosis not present

## 2017-03-16 ENCOUNTER — Telehealth: Payer: Self-pay

## 2017-03-16 ENCOUNTER — Other Ambulatory Visit: Payer: Self-pay | Admitting: Nurse Practitioner

## 2017-03-16 ENCOUNTER — Other Ambulatory Visit: Payer: Self-pay

## 2017-03-16 DIAGNOSIS — J4520 Mild intermittent asthma, uncomplicated: Secondary | ICD-10-CM

## 2017-03-16 MED ORDER — TIOTROPIUM BROMIDE MONOHYDRATE 1.25 MCG/ACT IN AERS: 1.0000 | INHALATION_SPRAY | Freq: Every day | RESPIRATORY_TRACT | 3 refills | Status: DC

## 2017-03-16 NOTE — Telephone Encounter (Signed)
-----   Message from Ronnell Freshwater, NP sent at 03/16/2017  3:32 PM EST ----- Regarding: RE: incruse giving bad side effects  Changed this to spiriva respimat which is other preferred medication per her insurance. Sent this to her pharmacy. May need to have pharmacist show her how to use this.thanks  ----- Message ----- From: Waynard Edwards Sent: 03/16/2017   2:55 PM To: Ronnell Freshwater, NP Subject: incruse giving bad side effects                Pt is having bad side effects with her incruse. What do you want to do for her?

## 2017-03-16 NOTE — Telephone Encounter (Signed)
Let patient know this 

## 2017-03-30 DIAGNOSIS — H43813 Vitreous degeneration, bilateral: Secondary | ICD-10-CM | POA: Diagnosis not present

## 2017-04-07 ENCOUNTER — Telehealth: Payer: Self-pay | Admitting: Internal Medicine

## 2017-04-07 NOTE — Telephone Encounter (Signed)
SIGNED CMN FOR 02 PUT INTO [facility] BOX TO BE PICKED UP.JW

## 2017-04-09 ENCOUNTER — Ambulatory Visit: Payer: Medicare HMO | Admitting: Internal Medicine

## 2017-04-09 ENCOUNTER — Encounter: Payer: Self-pay | Admitting: Internal Medicine

## 2017-04-09 VITALS — BP 142/84 | HR 71 | Resp 16 | Ht 65.5 in | Wt 209.8 lb

## 2017-04-09 DIAGNOSIS — R0902 Hypoxemia: Secondary | ICD-10-CM | POA: Diagnosis not present

## 2017-04-09 DIAGNOSIS — J449 Chronic obstructive pulmonary disease, unspecified: Secondary | ICD-10-CM

## 2017-04-09 NOTE — Progress Notes (Signed)
[facility], [place] [address]  Pulmonary Sleep Medicine  Office Visit Note  Patient Name: Gail Nelson DOB: 07/04/1923 MRN [id number]  Date of Service: 04/09/2017  Complaints/HPI: Overall she is doing well. She stays active. Patient states that she does need oxygen when the temperature is high. In the cold she does better. Patient states that she has no cough noted. No distress. She has had no admissions to the hospital. Last PFT shows MILD COPD. On inhalers   ROS  General: (-) fever, (-) chills, (-) night sweats, (-) weakness Skin: (-) rashes, (-) itching,. Eyes: (-) visual changes, (-) redness, (-) itching. Nose and Sinuses: (-) nasal stuffiness or itchiness, (-) postnasal drip, (-) nosebleeds, (-) sinus trouble. Mouth and Throat: (-) sore throat, (-) hoarseness. Neck: (-) swollen glands, (-) enlarged thyroid, (-) neck pain. Respiratory: - cough, (-) bloody sputum, - shortness of breath, - wheezing. Cardiovascular: - ankle swelling, (-) chest pain. Lymphatic: (-) lymph node enlargement. Neurologic: (-) numbness, (-) tingling. Psychiatric: (-) anxiety, (-) depression   Current Medication: Outpatient Encounter Medications as of 04/09/2017  Medication Sig  . albuterol (PROVENTIL) (2.5 MG/3ML) 0.083% nebulizer solution Take 2.5 mg by nebulization every 6 (six) hours as needed for wheezing or shortness of breath.  . betamethasone dipropionate (DIPROLENE) 0.05 % cream Apply topically 2 (two) times daily.  . carvedilol (COREG) 3.125 MG tablet Take 3.125 mg by mouth daily. At 1900  . furosemide (LASIX) 20 MG tablet Take 20 mg by mouth daily.  Marland Kitchen loratadine (CLARITIN) 10 MG tablet Take 10 mg by mouth daily as needed for allergies.  . Manganese 10 MG TABS Take 1 tablet by mouth daily.  . potassium gluconate 595 (99 K) MG TABS tablet Take 595 mg by mouth daily.  . predniSONE (DELTASONE) 50 MG tablet Take 1 tablet (50 mg total) by mouth daily with  breakfast. (Patient not taking: Reported on 02/19/2017)  . Tiotropium Bromide Monohydrate (SPIRIVA RESPIMAT) 1.25 MCG/ACT AERS Inhale 1 puff into the lungs daily.  Marland Kitchen umeclidinium bromide (INCRUSE ELLIPTA) 62.5 MCG/INH AEPB Inhale 1 puff into the lungs daily.  . vitamin B-12 (CYANOCOBALAMIN) 1000 MCG tablet Take 1,000 mcg by mouth daily.  . vitamin E 400 UNIT capsule Take 400 Units by mouth daily.   No facility-administered encounter medications on file as of 04/09/2017.     Surgical History: Past Surgical History:  Procedure Laterality Date  . ABDOMINAL AORTIC ANEURYSM REPAIR    . cataract surgery Bilateral   . mastectomy Left     Medical History: Past Medical History:  Diagnosis Date  . COPD (chronic obstructive pulmonary disease) ([place])   . History of breast cancer   . HTN (hypertension)     Family History: Family History  Problem Relation Age of Onset  . Hypertension Other     Social History: Social History   Socioeconomic History  . Marital status: Widowed    Spouse name: Not on file  . Number of children: Not on file  . Years of education: Not on file  . Highest education level: Not on file  Social Needs  . Financial resource strain: Not on file  . Food insecurity - worry: Not on file  . Food insecurity - inability: Not on file  . Transportation needs - medical: Not on file  . Transportation needs - non-medical: Not on file  Occupational History  . Not on file  Tobacco Use  . Smoking status: Never Smoker  . Smokeless  tobacco: Never Used  Substance and Sexual Activity  . Alcohol use: No  . Drug use: No  . Sexual activity: Not on file  Other Topics Concern  . Not on file  Social History Narrative  . Not on file    Vital Signs: Blood pressure (!) 142/84, pulse 71, resp. rate 16, height 5' 5.5" (1.664 m), weight 209 lb 12.8 oz (95.2 kg), SpO2 92 %.  Examination: General Appearance: The patient is well-developed, well-nourished, and in no distress. Skin:  Gross inspection of skin unremarkable. Head: normocephalic, no gross deformities. Eyes: no gross deformities noted. ENT: ears appear grossly normal no exudates. Neck: Supple. No thyromegaly. No LAD. Respiratory: no rhonchi noted clear. Cardiovascular: Normal S1 and S2 without murmur or rub. Extremities: No cyanosis. pulses are equal. Neurologic: Alert and oriented. No involuntary movements.  LABS: Recent Results (from the past 2160 hour(s))  Urinalysis, Routine w reflex microscopic     Status: None   Collection Time: 02/19/17  4:16 PM  Result Value Ref Range   Specific Gravity, UA 1.016 1.005 - 1.030   pH, UA 5.0 5.0 - 7.5   Color, UA Yellow Yellow   Appearance Ur Clear Clear   Leukocytes, UA Negative Negative   Protein, UA Negative Negative/Trace   Glucose, UA Negative Negative   Ketones, UA Negative Negative   RBC, UA Negative Negative   Bilirubin, UA Negative Negative   Urobilinogen, Ur 0.2 0.2 - 1.0 mg/dL   Nitrite, UA Negative Negative   Microscopic Examination Comment     Comment: Microscopic not indicated and not performed.    Radiology: Dg Chest 2 View  Result Date: 03/28/2016 CLINICAL DATA:  Acute onset of shortness of breath and cough. Initial encounter. EXAM: CHEST  2 VIEW COMPARISON:  Chest radiograph performed 03/17/2016 FINDINGS: The lungs are well-aerated. Mild bibasilar atelectasis or scarring is noted. There is no evidence of pleural effusion or pneumothorax. The heart is normal in size; the mediastinal contour is within normal limits. No acute osseous abnormalities are seen. Clips are noted within the right upper quadrant, reflecting prior cholecystectomy. IMPRESSION: Mild bibasilar atelectasis or scarring noted. Lungs otherwise clear. Electronically Signed   By: Garald Balding M.D.   On: 03/28/2016 18:18    No results found.  No results found.    Assessment and Plan: Patient Active Problem List   Diagnosis Date Noted  . Obstructive chronic bronchitis  without exacerbation ([place]) 02/19/2017  . Emphysema, unspecified ([place]) 02/19/2017  . COPD exacerbation ([place]) 03/28/2016  . HTN (hypertension) 03/28/2016    1. COPD She is doing well overall with present therapy Will continue with inhalers as prescribed She uses spiriva which is all she needs  2. Chronic Hypoxia Continues to use her oxygen as prescribed Will recheck 67mw next visit   General Counseling: I have discussed the findings of the evaluation and examination with Prentiss Bells.  I have also discussed any further diagnostic evaluation thatmay be needed or ordered today. Gwenda verbalizes understanding of the findings of todays visit. We also reviewed her medications today and discussed drug interactions and side effects including but not limited excessive drowsiness and altered mental states. We also discussed that there is always a risk not just to her but also people around her. she has been encouraged to call the office with any questions or concerns that should arise related to todays visit.    Time spent: 77min  I have personally obtained a history, examined the patient, evaluated laboratory and imaging results,  formulated the assessment and plan and placed orders.    Allyne Gee, MD [facility] Pulmonary and Critical Care Sleep medicine

## 2017-04-09 NOTE — Patient Instructions (Signed)
Home Oxygen Use, Adult    When a medical condition keeps you from getting enough oxygen, your health care provider may instruct you to take extra oxygen at home. Your health care provider will let you know:   When to take oxygen.   For how long to take oxygen.   How quickly oxygen should be delivered (flow rate), in liters per minute (LPM or L/M).    Home oxygen can be given through:   A mask.   A nasal cannula. This is a device or tube that goes in the nostrils.   A transtracheal catheter. This is a small, flexible tube placed in the trachea.   A tracheostomy. This is a surgically made opening in the trachea.    These devices are connected with tubing to an oxygen source, such as:   A tank. Tanks hold oxygen in gas form. They must be replaced when the oxygen is used up.   A liquid oxygen device. This holds oxygen in liquid form. It must be replaced when the oxygen is used up.   An oxygen concentrator machine. This filters oxygen in the room. It uses electricity, so you must have a backup cylinder of oxygen in case the power goes out.    Supplies needed:  To use oxygen, you will need:   A mask, nasal cannula, transtracheal catheter, or tracheostomy.   An oxygen tank, a liquid oxygen device, or an oxygen concentrator.   The tape that your health care provider recommends (optional).    If you use a transtracheal catheter and your prescribed flow rate is 1 LPM or greater, you will also need a humidifier.  Risks and complications   Fire. This can happen if the oxygen is exposed to a heat source, flame, or spark.   Injury to skin. This can happen if liquid oxygen touches your skin.   Organ damage. This can happen if you get too little oxygen.  How to use oxygen    Your health care provider will show you how to use your oxygen device. Follow her or his instructions. They may look something like this:  1. Wash your hands.  2. If you use an oxygen concentrator, make sure it is plugged in.  3. Place one  end of the tube into the port on the tank, device, or machine.  4. Place the mask over your nose and mouth. Or, place the nasal cannula and secure it with tape if instructed. If you use a tracheostomy or transtracheal catheter, connect it to the oxygen source as directed.  5. Make sure the liter-flow setting on the machine is at the level prescribed by your health care provider.  6. Turn on the machine or adjust the knob on the tank or device to the correct liter-flow setting.  7. When you are done, turn off and unplug the machine, or turn the knob to OFF.    How to clean and care for the oxygen supplies  Nasal cannula   Clean it with a warm, wet cloth daily or as needed.   Wash it with a liquid soap once a week.   Rinse it thoroughly once or twice a week.   Replace it every 2-4 weeks.   If you have an infection, such as a cold or pneumonia, change the cannula when you get better.  Mask   Replace it every 2-4 weeks.   If you have an infection, such as a cold or pneumonia, change the mask when   you get better.  Humidifier bottle   Wash the bottle between each refill:  ? Wash it with soap and warm water.  ? Rinse it thoroughly.  ? Disinfect it and its top.  ? Air-dry it.   Make sure it is dry before you refill it.  Oxygen concentrator   Clean the air filter at least twice a week according to directions from your home medical equipment and service company.   Wipe down the cabinet every day. To do this:  ? Unplug the unit.  ? Wipe down the cabinet with a damp cloth.  ? Dry the cabinet.  Other equipment   Change any extra tubing every 1-3 months.   Follow instructions from your health care provider about taking care of any other equipment.  Safety tips  Fire safety tips       Keep your oxygen and oxygen supplies at least 5 ft away from sources of heat, flames, and sparks at all times.   Do not allow smoking near your oxygen. Put up "no smoking" signs in your home.   Do not use materials that can burn  (are flammable) while you use oxygen.   When you go to a restaurant with portable oxygen, ask to be seated in the nonsmoking section.   Keep a fire extinguisher close by. Let your fire department know that you have oxygen in your home.   Test your home smoke detectors regularly.  General safety tips   If you use an oxygen cylinder, make sure it is in a stand or secured to an object that will not move (fixed object).   If you use liquid oxygen, make sure its container is kept upright.   If you use an oxygen concentrator:  ? Tell your electric company. Make sure you are given priority service in the event that your power goes out.  ? Avoid using extension cords, if possible.  Follow these instructions at home:   Use oxygen only as told by your health care provider.   Do not use alcohol or other drugs that make you relax (sedating drugs) unless instructed. They can slow down your breathing rate and make it hard to get in enough oxygen.   Know how and when to order a refill of oxygen.   Always keep a spare tank of oxygen. Plan ahead for holidays when you may not be able to get a prescription filled.   Use water-based lubricants on your lips or nostrils. Do not use oil-based products like petroleum jelly.   To prevent skin irritation on your cheeks or behind your ears, tuck some gauze under the tubing.  Contact a health care provider if:   You get headaches often.   You have shortness of breath.   You have a lasting cough.   You have anxiety.   You are sleepy all the time.   You develop an illness that affects your breathing.   You cannot exercise at your regular level.   You are restless.   You have difficult or irregular breathing, and it is getting worse.   You have a fever.   You have persistent redness under your nose.  Get help right away if:   You are confused.   You have blue lips or fingernails.   You are struggling to breathe.  This information is not intended to replace advice given  to you by your health care provider. Make sure you discuss any questions you have with your health   care provider.  Document Released: 04/12/2003 Document Revised: 09/19/2015 Document Reviewed: 08/14/2015  Elsevier Interactive Patient Education  2018 [employer]

## 2017-04-26 DIAGNOSIS — J449 Chronic obstructive pulmonary disease, unspecified: Secondary | ICD-10-CM | POA: Diagnosis not present

## 2017-05-20 ENCOUNTER — Telehealth: Payer: Self-pay

## 2017-05-20 ENCOUNTER — Other Ambulatory Visit: Payer: Self-pay

## 2017-05-20 MED ORDER — AZITHROMYCIN 250 MG PO TABS: ORAL_TABLET | ORAL | 0 refills | Status: DC

## 2017-05-20 MED ORDER — PREDNISONE 10 MG (21) PO TBPK: ORAL_TABLET | ORAL | 0 refills | Status: DC

## 2017-05-20 NOTE — Telephone Encounter (Signed)
Pt called with symptoms sore throat, coughing, and barely able to talk. Per Dr. Clayborn Bigness I am sending in prescriptions, prednisone(sterapred)  and azithromycin (z-pak) to pharmacy and have advised the pt that the prescriptions are sent.

## 2017-05-20 NOTE — Telephone Encounter (Signed)
It was done.

## 2017-05-27 DIAGNOSIS — J449 Chronic obstructive pulmonary disease, unspecified: Secondary | ICD-10-CM | POA: Diagnosis not present

## 2017-06-26 ENCOUNTER — Other Ambulatory Visit: Payer: Self-pay | Admitting: Nurse Practitioner

## 2017-06-26 DIAGNOSIS — R05 Cough: Secondary | ICD-10-CM

## 2017-06-26 DIAGNOSIS — J441 Chronic obstructive pulmonary disease with (acute) exacerbation: Secondary | ICD-10-CM

## 2017-06-26 DIAGNOSIS — J449 Chronic obstructive pulmonary disease, unspecified: Secondary | ICD-10-CM | POA: Diagnosis not present

## 2017-06-26 DIAGNOSIS — R059 Cough, unspecified: Secondary | ICD-10-CM

## 2017-06-26 MED ORDER — AZITHROMYCIN 250 MG PO TABS: ORAL_TABLET | ORAL | 0 refills | Status: DC

## 2017-06-26 MED ORDER — HYDROCOD POLST-CPM POLST ER 10-8 MG/5ML PO SUER: 5.0000 mL | Freq: Two times a day (BID) | ORAL | 0 refills | Status: DC | PRN

## 2017-06-26 NOTE — Progress Notes (Signed)
Patient called the office c/o cough for 2 to 3 weeks. Sent in prescription for z-pack and tussionex. Both prescriptions were sent to [facility]

## 2017-07-06 ENCOUNTER — Ambulatory Visit: Payer: Medicare HMO | Admitting: Nurse Practitioner

## 2017-07-06 ENCOUNTER — Encounter: Payer: Self-pay | Admitting: Nurse Practitioner

## 2017-07-06 VITALS — BP 168/92 | HR 97 | Temp 97.7°F | Resp 16 | Ht 62.0 in | Wt 214.4 lb

## 2017-07-06 DIAGNOSIS — I10 Essential (primary) hypertension: Secondary | ICD-10-CM | POA: Diagnosis not present

## 2017-07-06 DIAGNOSIS — J449 Chronic obstructive pulmonary disease, unspecified: Secondary | ICD-10-CM | POA: Diagnosis not present

## 2017-07-06 DIAGNOSIS — B354 Tinea corporis: Secondary | ICD-10-CM | POA: Diagnosis not present

## 2017-07-06 DIAGNOSIS — R079 Chest pain, unspecified: Secondary | ICD-10-CM | POA: Diagnosis not present

## 2017-07-06 MED ORDER — CLOTRIMAZOLE-BETAMETHASONE 1-0.05 % EX CREA: 1.0000 "application " | TOPICAL_CREAM | Freq: Two times a day (BID) | CUTANEOUS | 1 refills | Status: DC

## 2017-07-06 MED ORDER — CARVEDILOL 3.125 MG PO TABS: 3.1250 mg | ORAL_TABLET | Freq: Every day | ORAL | 5 refills | Status: DC

## 2017-07-06 NOTE — Progress Notes (Signed)
[facility] [place], [place] [address]  Internal MEDICINE  Office Visit Note  Patient Name: Gail Nelson  [id number]  [id number]  Date of Service: 07/26/2017   Pt is here for a sick visit.  Chief Complaint  Patient presents with  . Rash  . Chest Pain     Rash  This is a recurrent problem. The current episode started in the past 7 days. The problem is unchanged. The affected locations include the chest, abdomen and groin. The rash is characterized by itchiness, redness and burning. She was exposed to nothing. Associated symptoms include shortness of breath. Pertinent negatives include no congestion, cough, diarrhea, fatigue, rhinorrhea, sore throat or vomiting. Past treatments include anti-itch cream and topical steroids. The treatment provided mild relief. Her past medical history is significant for eczema.  Chest Pain   This is a new problem. The current episode started yesterday. The onset quality is gradual. The problem occurs intermittently. The problem has been resolved. The pain is present in the epigastric region. The pain is at a severity of 5/10. The pain is moderate. The quality of the pain is described as tightness. The pain radiates to the left shoulder. Associated symptoms include shortness of breath. Pertinent negatives include no abdominal pain, back pain, cough, dizziness, headaches, nausea, numbness, palpitations or vomiting. She has tried nothing for the symptoms. Risk factors include post-menopausal.  Her past medical history is significant for COPD and hypertension.  Her family medical history is significant for CAD. Prior diagnostic workup includes echocardiogram.        Current Medication:  Outpatient Encounter Medications as of 07/06/2017  Medication Sig  . carvedilol (COREG) 3.125 MG tablet Take 1 tablet (3.125 mg total) by mouth daily. At 1900  . [DISCONTINUED] carvedilol (COREG) 3.125 MG tablet Take 3.125 mg by mouth daily.  At 1900  . albuterol (PROVENTIL) (2.5 MG/3ML) 0.083% nebulizer solution Take 2.5 mg by nebulization every 6 (six) hours as needed for wheezing or shortness of breath.  Marland Kitchen azithromycin (ZITHROMAX Z-PAK) 250 MG tablet Take as directed for 5 days  . betamethasone dipropionate (DIPROLENE) 0.05 % cream Apply topically 2 (two) times daily.  . chlorpheniramine-HYDROcodone (TUSSIONEX PENNKINETIC ER) 10-8 MG/5ML SUER Take 5 mLs by mouth every 12 (twelve) hours as needed for cough.  . clotrimazole-betamethasone (LOTRISONE) cream Apply 1 application topically 2 (two) times daily.  . furosemide (LASIX) 20 MG tablet Take 20 mg by mouth daily.  Marland Kitchen loratadine (CLARITIN) 10 MG tablet Take 10 mg by mouth daily as needed for allergies.  . Manganese 10 MG TABS Take 1 tablet by mouth daily.  . potassium gluconate 595 (99 K) MG TABS tablet Take 595 mg by mouth daily.  . predniSONE (DELTASONE) 50 MG tablet Take 1 tablet (50 mg total) by mouth daily with breakfast. (Patient not taking: Reported on 02/19/2017)  . predniSONE (STERAPRED UNI-PAK 21 TAB) 10 MG (21) TBPK tablet Take as directed for 6 days  . Tiotropium Bromide Monohydrate (SPIRIVA RESPIMAT) 1.25 MCG/ACT AERS Inhale 1 puff into the lungs daily.  Marland Kitchen umeclidinium bromide (INCRUSE ELLIPTA) 62.5 MCG/INH AEPB Inhale 1 puff into the lungs daily.  . vitamin B-12 (CYANOCOBALAMIN) 1000 MCG tablet Take 1,000 mcg by mouth daily.  . vitamin E 400 UNIT capsule Take 400 Units by mouth daily.   No facility-administered encounter medications on file as of 07/06/2017.       Medical History: Past Medical History:  Diagnosis Date  . COPD (chronic obstructive pulmonary disease) (  [place])   . History of breast cancer   . HTN (hypertension)      Today's Vitals   07/06/17 1439  BP: (!) 168/92  Pulse: 97  Resp: 16  Temp: 97.7 F (36.5 C)  SpO2: 90%  Weight: 214 lb 6.4 oz (97.3 kg)  Height: 5\' 2"  (1.575 m)    Review of Systems  Constitutional: Negative for activity  change, chills, fatigue and unexpected weight change.  HENT: Negative for congestion, postnasal drip, rhinorrhea, sneezing and sore throat.   Eyes: Negative.  Negative for redness.  Respiratory: Positive for chest tightness and shortness of breath. Negative for cough.        Very mild shortness of breath with exertion.   Cardiovascular: Positive for chest pain. Negative for palpitations.  Gastrointestinal: Negative for abdominal pain, constipation, diarrhea, nausea and vomiting.  Endocrine: Negative for cold intolerance, heat intolerance, polydipsia, polyphagia and polyuria.  Genitourinary: Negative for dysuria and frequency.  Musculoskeletal: Negative for arthralgias, back pain, joint swelling and neck pain.  Skin: Positive for rash.  Allergic/Immunologic: Positive for environmental allergies.  Neurological: Negative for dizziness, tremors, numbness and headaches.  Hematological: Negative for adenopathy. Does not bruise/bleed easily.  Psychiatric/Behavioral: Negative for behavioral problems (Depression), sleep disturbance and suicidal ideas. The patient is not nervous/anxious.     Physical Exam  Constitutional: She is oriented to person, place, and time. She appears well-developed and well-nourished. No distress.  HENT:  Head: Normocephalic and atraumatic.  Mouth/Throat: Oropharynx is clear and moist. No oropharyngeal exudate.  Eyes: Pupils are equal, round, and reactive to light. Conjunctivae and EOM are normal.  Neck: Normal range of motion. Neck supple. No JVD present. Carotid bruit is not present. No tracheal deviation present. No thyromegaly present.  Cardiovascular: Normal rate, regular rhythm and normal heart sounds. Exam reveals no gallop and no friction rub.  No murmur heard. ECG showing low voltage limb leads but no acute abnormalities.   Pulmonary/Chest: Effort normal and breath sounds normal. No respiratory distress. She has no wheezes. She has no rales. She exhibits no  tenderness.  Abdominal: Soft. Bowel sounds are normal. There is no tenderness.  Musculoskeletal: Normal range of motion.  Lymphadenopathy:    She has no cervical adenopathy.  Neurological: She is alert and oriented to person, place, and time. No cranial nerve deficit.  Skin: Skin is warm and dry. Capillary refill takes less than 2 seconds. She is not diaphoretic.  Red, warm, irritated appearing rash in groin, in folds of abdomen, and under the breasts. Skin intact and there is no drainage present.   Psychiatric: She has a normal mood and affect. Her behavior is normal. Judgment and thought content normal.  Nursing note and vitals reviewed.  Assessment/Plan: 1. Chest pain, unspecified type - EKG 12-Lead showing low voltage limb leads without acute abnormalities. Has resolved. Will monitor.   2. Tinea corporis - clotrimazole-betamethasone (LOTRISONE) cream; Apply 1 application topically 2 (two) times daily.  Dispense: 45 g; Refill: 1  3. Essential hypertension Continue bp medication as prescribed.  - carvedilol (COREG) 3.125 MG tablet; Take 1 tablet (3.125 mg total) by mouth daily. At 1900  Dispense: 90 tablet; Refill: 5  4. Obstructive chronic bronchitis without exacerbation ([place]) Continue inhalers and respiratory medications as needed and as prescribed.   General Counseling: Daissy verbalizes understanding of the findings of todays visit and agrees with plan of treatment. I have discussed any further diagnostic evaluation that may be needed or ordered today. We also reviewed her medications  today. she has been encouraged to call the office with any questions or concerns that should arise related to todays visit.    Counseling:  This patient was seen by Leretha Pol, FNP- C in Collaboration with Dr Lavera Guise as a part of collaborative care agreement   Orders Placed This Encounter  Procedures  . EKG 12-Lead    Meds ordered this encounter  Medications  . carvedilol (COREG)  3.125 MG tablet    Sig: Take 1 tablet (3.125 mg total) by mouth daily. At 1900    Dispense:  90 tablet    Refill:  5    Patient taking this one daily. Does not need refill for this at this time.    Order Specific Question:   Supervising Provider    Answer:   Lavera Guise [[id number]]  . clotrimazole-betamethasone (LOTRISONE) cream    Sig: Apply 1 application topically 2 (two) times daily.    Dispense:  45 g    Refill:  1    Order Specific Question:   Supervising Provider    Answer:   Lavera Guise [[id number]]    Time spent: 25 Minutes

## 2017-07-16 ENCOUNTER — Other Ambulatory Visit: Payer: Self-pay | Admitting: Internal Medicine

## 2017-07-21 ENCOUNTER — Telehealth: Payer: Self-pay | Admitting: Internal Medicine

## 2017-07-21 NOTE — Telephone Encounter (Signed)
Prior auth for spiriva handihaler is authorized from 02/01/17-02/02/18

## 2017-07-26 DIAGNOSIS — R079 Chest pain, unspecified: Secondary | ICD-10-CM | POA: Insufficient documentation

## 2017-07-26 DIAGNOSIS — B354 Tinea corporis: Secondary | ICD-10-CM | POA: Insufficient documentation

## 2017-07-27 DIAGNOSIS — J449 Chronic obstructive pulmonary disease, unspecified: Secondary | ICD-10-CM | POA: Diagnosis not present

## 2017-08-12 ENCOUNTER — Encounter (INDEPENDENT_AMBULATORY_CARE_PROVIDER_SITE_OTHER): Payer: Self-pay

## 2017-08-12 ENCOUNTER — Ambulatory Visit (INDEPENDENT_AMBULATORY_CARE_PROVIDER_SITE_OTHER): Payer: Medicare HMO | Admitting: Internal Medicine

## 2017-08-12 ENCOUNTER — Ambulatory Visit
Admission: RE | Admit: 2017-08-12 | Discharge: 2017-08-12 | Disposition: A | Discharge status: Home / Self Care | Payer: Medicare HMO | Source: Ambulatory Visit | Attending: Internal Medicine | Admitting: Internal Medicine

## 2017-08-12 ENCOUNTER — Encounter: Payer: Self-pay | Admitting: Internal Medicine

## 2017-08-12 VITALS — BP 170/72 | HR 72 | Temp 98.5°F | Resp 16 | Ht 62.0 in | Wt 215.0 lb

## 2017-08-12 DIAGNOSIS — J449 Chronic obstructive pulmonary disease, unspecified: Secondary | ICD-10-CM

## 2017-08-12 DIAGNOSIS — I10 Essential (primary) hypertension: Secondary | ICD-10-CM | POA: Diagnosis not present

## 2017-08-12 DIAGNOSIS — R079 Chest pain, unspecified: Secondary | ICD-10-CM

## 2017-08-12 DIAGNOSIS — M542 Cervicalgia: Secondary | ICD-10-CM

## 2017-08-12 DIAGNOSIS — M47812 Spondylosis without myelopathy or radiculopathy, cervical region: Secondary | ICD-10-CM | POA: Diagnosis not present

## 2017-08-12 DIAGNOSIS — M50322 Other cervical disc degeneration at C5-C6 level: Secondary | ICD-10-CM | POA: Insufficient documentation

## 2017-08-12 DIAGNOSIS — R220 Localized swelling, mass and lump, head: Secondary | ICD-10-CM | POA: Diagnosis not present
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
## 2017-08-13 LAB — CBC WITH DIFFERENTIAL
BASOS ABS: 0 10*3/uL (ref 0.0–0.2)
BASOS: 0 %
EOS (ABSOLUTE): 0.1 10*3/uL (ref 0.0–0.4)
Eos: 2 %
Hematocrit: 43.1 % (ref 34.0–46.6)
Hemoglobin: 14 g/dL (ref 11.1–15.9)
IMMATURE GRANS (ABS): 0.1 10*3/uL (ref 0.0–0.1)
Immature Granulocytes: 1 %
LYMPHS: 21 %
Lymphocytes Absolute: 1.5 10*3/uL (ref 0.7–3.1)
MCH: 28.9 pg (ref 26.6–33.0)
MCHC: 32.5 g/dL (ref 31.5–35.7)
MCV: 89 fL (ref 79–97)
Monocytes Absolute: 0.4 10*3/uL (ref 0.1–0.9)
Monocytes: 5 %
NEUTROS PCT: 71 %
Neutrophils Absolute: 5.2 10*3/uL (ref 1.4–7.0)
RBC: 4.85 x10E6/uL (ref 3.77–5.28)
RDW: 14.9 % (ref 12.3–15.4)
WBC: 7.2 10*3/uL (ref 3.4–10.8)

## 2017-08-13 NOTE — Progress Notes (Signed)
PT GRANDDAUGHTER WAS NOTIFIED.

## 2017-08-18 ENCOUNTER — Encounter: Payer: Self-pay | Admitting: Internal Medicine

## 2017-08-18 ENCOUNTER — Ambulatory Visit (INDEPENDENT_AMBULATORY_CARE_PROVIDER_SITE_OTHER): Payer: Medicare HMO | Admitting: Adult Health

## 2017-08-18 VITALS — BP 142/88 | HR 82 | Ht 62.0 in | Wt 215.6 lb

## 2017-08-18 DIAGNOSIS — M542 Cervicalgia: Secondary | ICD-10-CM | POA: Diagnosis not present

## 2017-08-18 DIAGNOSIS — R200 Anesthesia of skin: Secondary | ICD-10-CM | POA: Diagnosis not present

## 2017-08-18 DIAGNOSIS — I10 Essential (primary) hypertension: Secondary | ICD-10-CM | POA: Diagnosis not present

## 2017-08-18 MED ORDER — DICLOFENAC SODIUM 1 % TD GEL: 4.0000 g | Freq: Four times a day (QID) | TRANSDERMAL | 4 refills | Status: DC
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
## 2017-08-19 ENCOUNTER — Other Ambulatory Visit (INDEPENDENT_AMBULATORY_CARE_PROVIDER_SITE_OTHER): Payer: Self-pay | Admitting: Vascular Surgery

## 2017-08-19 DIAGNOSIS — I714 Abdominal aortic aneurysm, without rupture, unspecified: Secondary | ICD-10-CM

## 2017-08-20 ENCOUNTER — Ambulatory Visit (INDEPENDENT_AMBULATORY_CARE_PROVIDER_SITE_OTHER): Payer: Medicare HMO

## 2017-08-20 ENCOUNTER — Encounter (INDEPENDENT_AMBULATORY_CARE_PROVIDER_SITE_OTHER): Payer: Self-pay | Admitting: Vascular Surgery

## 2017-08-20 ENCOUNTER — Ambulatory Visit (INDEPENDENT_AMBULATORY_CARE_PROVIDER_SITE_OTHER): Payer: Medicare HMO | Admitting: Vascular Surgery

## 2017-08-20 VITALS — BP 172/74 | HR 68 | Resp 16 | Ht 62.0 in | Wt 214.6 lb

## 2017-08-20 DIAGNOSIS — I714 Abdominal aortic aneurysm, without rupture, unspecified: Secondary | ICD-10-CM

## 2017-08-20 DIAGNOSIS — I10 Essential (primary) hypertension: Secondary | ICD-10-CM | POA: Diagnosis not present

## 2017-08-20 DIAGNOSIS — J449 Chronic obstructive pulmonary disease, unspecified: Secondary | ICD-10-CM | POA: Diagnosis not present

## 2017-08-20 DIAGNOSIS — I890 Lymphedema, not elsewhere classified: Secondary | ICD-10-CM

## 2017-08-20 DIAGNOSIS — J4489 Other specified chronic obstructive pulmonary disease: Secondary | ICD-10-CM
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
## 2017-08-23 ENCOUNTER — Encounter (INDEPENDENT_AMBULATORY_CARE_PROVIDER_SITE_OTHER): Payer: Self-pay | Admitting: Vascular Surgery

## 2017-08-23 DIAGNOSIS — I890 Lymphedema, not elsewhere classified: Secondary | ICD-10-CM | POA: Insufficient documentation

## 2017-08-23 DIAGNOSIS — I714 Abdominal aortic aneurysm, without rupture, unspecified: Secondary | ICD-10-CM | POA: Insufficient documentation

## 2017-08-26 DIAGNOSIS — J449 Chronic obstructive pulmonary disease, unspecified: Secondary | ICD-10-CM | POA: Diagnosis not present

## 2017-09-07 ENCOUNTER — Other Ambulatory Visit: Payer: Self-pay | Admitting: Student

## 2017-09-07 DIAGNOSIS — M4722 Other spondylosis with radiculopathy, cervical region: Secondary | ICD-10-CM

## 2017-09-07 DIAGNOSIS — M4802 Spinal stenosis, cervical region: Secondary | ICD-10-CM

## 2017-09-07 DIAGNOSIS — R200 Anesthesia of skin: Secondary | ICD-10-CM | POA: Diagnosis not present

## 2017-09-07 DIAGNOSIS — R202 Paresthesia of skin: Secondary | ICD-10-CM | POA: Diagnosis not present

## 2017-09-22 ENCOUNTER — Ambulatory Visit: Payer: Medicare HMO

## 2017-09-26 DIAGNOSIS — J449 Chronic obstructive pulmonary disease, unspecified: Secondary | ICD-10-CM | POA: Diagnosis not present

## 2017-10-19 ENCOUNTER — Encounter: Payer: Self-pay | Admitting: Internal Medicine

## 2017-10-19 ENCOUNTER — Ambulatory Visit: Payer: Medicare HMO | Admitting: Internal Medicine

## 2017-10-19 VITALS — BP 128/78 | HR 83 | Resp 16 | Ht 62.0 in | Wt 216.0 lb

## 2017-10-19 DIAGNOSIS — Z9981 Dependence on supplemental oxygen: Secondary | ICD-10-CM

## 2017-10-19 DIAGNOSIS — J449 Chronic obstructive pulmonary disease, unspecified: Secondary | ICD-10-CM | POA: Diagnosis not present

## 2017-10-19 DIAGNOSIS — I10 Essential (primary) hypertension: Secondary | ICD-10-CM | POA: Diagnosis not present

## 2017-10-19 DIAGNOSIS — R0602 Shortness of breath: Secondary | ICD-10-CM | POA: Diagnosis not present

## 2017-10-19 DIAGNOSIS — J4489 Other specified chronic obstructive pulmonary disease: Secondary | ICD-10-CM

## 2017-10-19 NOTE — Patient Instructions (Signed)
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
## 2017-10-27 DIAGNOSIS — J449 Chronic obstructive pulmonary disease, unspecified: Secondary | ICD-10-CM | POA: Diagnosis not present

## 2017-11-18 ENCOUNTER — Ambulatory Visit (INDEPENDENT_AMBULATORY_CARE_PROVIDER_SITE_OTHER): Payer: Medicare HMO | Admitting: Adult Health

## 2017-11-18 ENCOUNTER — Encounter: Payer: Self-pay | Admitting: Adult Health

## 2017-11-18 VITALS — BP 142/92 | HR 110 | Resp 16 | Ht 62.0 in | Wt 216.0 lb

## 2017-11-18 DIAGNOSIS — Z9981 Dependence on supplemental oxygen: Secondary | ICD-10-CM | POA: Diagnosis not present

## 2017-11-18 DIAGNOSIS — I10 Essential (primary) hypertension: Secondary | ICD-10-CM

## 2017-11-18 DIAGNOSIS — R140 Abdominal distension (gaseous): Secondary | ICD-10-CM

## 2017-11-18 DIAGNOSIS — M542 Cervicalgia: Secondary | ICD-10-CM | POA: Diagnosis not present

## 2017-11-18 DIAGNOSIS — J449 Chronic obstructive pulmonary disease, unspecified: Secondary | ICD-10-CM | POA: Diagnosis not present
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
## 2017-11-18 NOTE — Patient Instructions (Signed)

## 2017-11-26 DIAGNOSIS — J449 Chronic obstructive pulmonary disease, unspecified: Secondary | ICD-10-CM | POA: Diagnosis not present

## 2017-12-01 NOTE — Addendum Note (Signed)
Addended by: Lavera Guise on: 12/01/2017 11:13 AM   Modules accepted: Orders

## 2017-12-11 ENCOUNTER — Other Ambulatory Visit: Payer: Self-pay

## 2017-12-21 ENCOUNTER — Other Ambulatory Visit: Payer: Self-pay

## 2017-12-21 MED ORDER — BETAMETHASONE DIPROPIONATE 0.05 % EX CREA: TOPICAL_CREAM | Freq: Two times a day (BID) | CUTANEOUS | 0 refills | Status: DC

## 2017-12-27 DIAGNOSIS — J449 Chronic obstructive pulmonary disease, unspecified: Secondary | ICD-10-CM | POA: Diagnosis not present

## 2017-12-28 ENCOUNTER — Other Ambulatory Visit: Payer: Self-pay

## 2018-01-14 ENCOUNTER — Ambulatory Visit (INDEPENDENT_AMBULATORY_CARE_PROVIDER_SITE_OTHER): Payer: Medicare HMO | Admitting: Adult Health

## 2018-01-14 ENCOUNTER — Encounter: Payer: Self-pay | Admitting: Adult Health

## 2018-01-14 VITALS — BP 144/88 | HR 88 | Temp 97.9°F | Resp 16 | Ht 62.0 in | Wt 215.0 lb

## 2018-01-14 DIAGNOSIS — I10 Essential (primary) hypertension: Secondary | ICD-10-CM

## 2018-01-14 DIAGNOSIS — R319 Hematuria, unspecified: Secondary | ICD-10-CM

## 2018-01-14 DIAGNOSIS — R600 Localized edema: Secondary | ICD-10-CM

## 2018-01-14 DIAGNOSIS — N390 Urinary tract infection, site not specified: Secondary | ICD-10-CM

## 2018-01-14 DIAGNOSIS — R300 Dysuria: Secondary | ICD-10-CM | POA: Diagnosis not present

## 2018-01-14 DIAGNOSIS — Z9981 Dependence on supplemental oxygen: Secondary | ICD-10-CM

## 2018-01-14 LAB — POCT URINALYSIS DIPSTICK
Bilirubin, UA: NEGATIVE
Glucose, UA: NEGATIVE
Ketones, UA: NEGATIVE
Leukocytes, UA: NEGATIVE
NITRITE UA: NEGATIVE
Protein, UA: NEGATIVE
SPEC GRAV UA: 1.01 (ref 1.010–1.025)
Urobilinogen, UA: 0.2 E.U./dL
pH, UA: 5 (ref 5.0–8.0)

## 2018-01-14 MED ORDER — PHENAZOPYRIDINE HCL 200 MG PO TABS: 200.0000 mg | ORAL_TABLET | Freq: Three times a day (TID) | ORAL | 0 refills | Status: DC | PRN
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
## 2018-01-14 NOTE — Patient Instructions (Signed)

## 2018-01-17 LAB — CULTURE, URINE COMPREHENSIVE

## 2018-01-26 DIAGNOSIS — J449 Chronic obstructive pulmonary disease, unspecified: Secondary | ICD-10-CM | POA: Diagnosis not present

## 2018-02-10 ENCOUNTER — Other Ambulatory Visit: Payer: Self-pay

## 2018-02-10 MED ORDER — TIOTROPIUM BROMIDE MONOHYDRATE 18 MCG IN CAPS: ORAL_CAPSULE | RESPIRATORY_TRACT | 6 refills | Status: DC

## 2018-02-11 ENCOUNTER — Telehealth: Payer: Self-pay

## 2018-02-11 NOTE — Telephone Encounter (Signed)
Cab you please add this to her med list and send new prescription for me? Thanks.

## 2018-02-12 ENCOUNTER — Other Ambulatory Visit: Payer: Self-pay

## 2018-02-12 DIAGNOSIS — J449 Chronic obstructive pulmonary disease, unspecified: Secondary | ICD-10-CM

## 2018-02-12 MED ORDER — UMECLIDINIUM BROMIDE 62.5 MCG/INH IN AEPB: 1.0000 | INHALATION_SPRAY | Freq: Every day | RESPIRATORY_TRACT | 3 refills | Status: DC

## 2018-02-12 NOTE — Telephone Encounter (Signed)
Per [place] spiriva was no longer covered and [employer] said that incruse ellipta is an alternative. Per Nira Conn we sent in incruse and pt was advised.

## 2018-02-12 NOTE — Telephone Encounter (Signed)
Done and pt was notified.

## 2018-02-15 ENCOUNTER — Telehealth: Payer: Self-pay

## 2018-02-15 ENCOUNTER — Other Ambulatory Visit: Payer: Self-pay

## 2018-02-15 NOTE — Telephone Encounter (Signed)
Pt daughter called they cannot filled pres for spirivia handihaler  or incruse until feb as per heather gave samples for sprivia respimat 2.25 2 puff daily

## 2018-02-15 NOTE — Telephone Encounter (Signed)
PT CALLED SAYING THAT INCRUSE IS STILL TO EXPENSIVE FOR HER AFTER SWITCHING FROM SPIRIVA.   CALLED PT PHARMACY AND SPOKE WITH THE PHARMACIST. PHARMACIST LOOKED AROUND TO SEE IF ANYTHING ELSE WAS COVERED AS AN ALTERNATIVE. SHE LOOKED INTO T[employer]OR[employer] AND THAT DOES NOT GET COVERED BY PT INSURANCE. PHARMACIST SAID FOR PT TO CALL INSURANCE AND SEE IF PT HAS A DEDUCTIBLE FOR BOTH SPIRIVA AND INCRUSE AND TO SEE IF PT HAS TO PAY A DEDUCTIBLE AT FIRST AND THEN HAVE TO PAY A SMALLER FEE AFTER.   NOTIFIED PT TO CALL INSURANCE AND ASK IF BOTH SPIRIVA AND INCRUSE HAVE DEDUCTIBLES AND IF SHE WILL BE PAYING A CHEAPER PRICE AFTER THE DEDUCTIBLE.

## 2018-02-22 ENCOUNTER — Encounter: Payer: Self-pay | Admitting: Nurse Practitioner

## 2018-02-22 ENCOUNTER — Telehealth: Payer: Self-pay

## 2018-02-22 ENCOUNTER — Ambulatory Visit (INDEPENDENT_AMBULATORY_CARE_PROVIDER_SITE_OTHER): Payer: Medicare HMO | Admitting: Nurse Practitioner

## 2018-02-22 VITALS — BP 152/74 | HR 65 | Resp 16 | Ht 62.0 in | Wt 215.4 lb

## 2018-02-22 DIAGNOSIS — I70413 Atherosclerosis of autologous vein bypass graft(s) of the extremities with intermittent claudication, bilateral legs: Secondary | ICD-10-CM | POA: Diagnosis not present

## 2018-02-22 DIAGNOSIS — J449 Chronic obstructive pulmonary disease, unspecified: Secondary | ICD-10-CM

## 2018-02-22 DIAGNOSIS — R300 Dysuria: Secondary | ICD-10-CM | POA: Diagnosis not present

## 2018-02-22 DIAGNOSIS — R600 Localized edema: Secondary | ICD-10-CM

## 2018-02-22 DIAGNOSIS — Z0001 Encounter for general adult medical examination with abnormal findings: Secondary | ICD-10-CM | POA: Diagnosis not present

## 2018-02-22 DIAGNOSIS — I10 Essential (primary) hypertension: Secondary | ICD-10-CM

## 2018-02-22 DIAGNOSIS — J4489 Other specified chronic obstructive pulmonary disease: Secondary | ICD-10-CM
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
## 2018-02-22 NOTE — Telephone Encounter (Signed)
Pt informed me during her visit that as of February 1,2020 She will be with [facility]

## 2018-02-23 LAB — MICROSCOPIC EXAMINATION: Casts: NONE SEEN /lpf

## 2018-02-23 LAB — UA/M W/RFLX CULTURE, ROUTINE
Bilirubin, UA: NEGATIVE
Glucose, UA: NEGATIVE
Ketones, UA: NEGATIVE
LEUKOCYTES UA: NEGATIVE
Nitrite, UA: NEGATIVE
PROTEIN UA: NEGATIVE
RBC, UA: NEGATIVE
Specific Gravity, UA: 1.012 (ref 1.005–1.030)
Urobilinogen, Ur: 0.2 mg/dL (ref 0.2–1.0)
pH, UA: 5.5 (ref 5.0–7.5)

## 2018-02-24 ENCOUNTER — Ambulatory Visit (INDEPENDENT_AMBULATORY_CARE_PROVIDER_SITE_OTHER): Payer: Medicare HMO | Admitting: Vascular Surgery

## 2018-02-24 ENCOUNTER — Encounter (INDEPENDENT_AMBULATORY_CARE_PROVIDER_SITE_OTHER): Payer: Self-pay | Admitting: Vascular Surgery

## 2018-02-24 VITALS — BP 142/73 | HR 96 | Resp 18 | Ht 62.0 in | Wt 215.6 lb

## 2018-02-24 DIAGNOSIS — M79604 Pain in right leg: Secondary | ICD-10-CM

## 2018-02-24 DIAGNOSIS — I890 Lymphedema, not elsewhere classified: Secondary | ICD-10-CM

## 2018-02-24 DIAGNOSIS — I714 Abdominal aortic aneurysm, without rupture, unspecified: Secondary | ICD-10-CM

## 2018-02-24 DIAGNOSIS — M79605 Pain in left leg: Secondary | ICD-10-CM
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
## 2018-02-26 DIAGNOSIS — J449 Chronic obstructive pulmonary disease, unspecified: Secondary | ICD-10-CM | POA: Diagnosis not present

## 2018-03-03 DIAGNOSIS — Z961 Presence of intraocular lens: Secondary | ICD-10-CM | POA: Diagnosis not present

## 2018-03-12 ENCOUNTER — Other Ambulatory Visit: Payer: Self-pay | Admitting: Internal Medicine

## 2018-03-12 DIAGNOSIS — I10 Essential (primary) hypertension: Secondary | ICD-10-CM

## 2018-03-19 ENCOUNTER — Encounter: Payer: Self-pay | Admitting: Nurse Practitioner

## 2018-03-19 ENCOUNTER — Ambulatory Visit (INDEPENDENT_AMBULATORY_CARE_PROVIDER_SITE_OTHER): Payer: Medicare Other | Admitting: Nurse Practitioner

## 2018-03-19 VITALS — BP 142/72 | HR 62 | Resp 16 | Ht 62.0 in | Wt 215.2 lb

## 2018-03-19 DIAGNOSIS — M79605 Pain in left leg: Secondary | ICD-10-CM

## 2018-03-19 DIAGNOSIS — M79604 Pain in right leg: Secondary | ICD-10-CM

## 2018-03-19 DIAGNOSIS — I70413 Atherosclerosis of autologous vein bypass graft(s) of the extremities with intermittent claudication, bilateral legs: Secondary | ICD-10-CM

## 2018-03-19 DIAGNOSIS — I10 Essential (primary) hypertension: Secondary | ICD-10-CM

## 2018-03-19 MED ORDER — DICLOFENAC SODIUM 1 % TD GEL: 4.0000 g | Freq: Four times a day (QID) | TRANSDERMAL | 4 refills | Status: DC
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
## 2018-03-22 ENCOUNTER — Telehealth: Payer: Self-pay

## 2018-03-23 ENCOUNTER — Other Ambulatory Visit: Payer: Self-pay | Admitting: Nurse Practitioner

## 2018-03-23 DIAGNOSIS — M79605 Pain in left leg: Principal | ICD-10-CM

## 2018-03-23 DIAGNOSIS — M79604 Pain in right leg: Secondary | ICD-10-CM

## 2018-03-23 MED ORDER — DICLOFENAC SODIUM 1 % TD CREA: 4.0000 g | TOPICAL_CREAM | Freq: Four times a day (QID) | TRANSDERMAL | 3 refills | Status: DC | PRN

## 2018-03-23 NOTE — Telephone Encounter (Signed)
Changed diclofenac gel to cream per patient request. Same application instructions. New prescription sent to her pharmacy.

## 2018-03-23 NOTE — Progress Notes (Signed)
Changed diclofenac gel to cream per patient request. Same application instructions. New prescription sent to her pharmacy.

## 2018-03-23 NOTE — Telephone Encounter (Signed)
Pt advised we send diclofenac cream

## 2018-03-25 ENCOUNTER — Telehealth: Payer: Self-pay

## 2018-03-25 ENCOUNTER — Other Ambulatory Visit: Payer: Self-pay

## 2018-03-25 ENCOUNTER — Other Ambulatory Visit: Payer: Self-pay | Admitting: Nurse Practitioner

## 2018-03-25 DIAGNOSIS — B372 Candidiasis of skin and nail: Secondary | ICD-10-CM

## 2018-03-25 MED ORDER — CLOTRIMAZOLE-BETAMETHASONE 1-0.05 % EX CREA: 1.0000 "application " | TOPICAL_CREAM | Freq: Two times a day (BID) | CUTANEOUS | 1 refills | Status: DC

## 2018-03-25 NOTE — Telephone Encounter (Signed)
Sent prescription for lotrisone cream which is clotrimazole and betamethasone together. May be applied twice daily to affected areas.

## 2018-03-25 NOTE — Telephone Encounter (Signed)
No. That is just to help with skin itching and irritation

## 2018-03-25 NOTE — Progress Notes (Signed)
Sent prescription for lotrisone cream which is clotrimazole and betamethasone together. May be applied twice daily to affected areas.

## 2018-03-25 NOTE — Telephone Encounter (Signed)
lmom to pt that I spoke with phar diclofenac come only in gel not on cream

## 2018-03-29 ENCOUNTER — Other Ambulatory Visit (INDEPENDENT_AMBULATORY_CARE_PROVIDER_SITE_OTHER): Payer: Self-pay | Admitting: Vascular Surgery

## 2018-03-29 ENCOUNTER — Ambulatory Visit (INDEPENDENT_AMBULATORY_CARE_PROVIDER_SITE_OTHER): Payer: Medicare Other

## 2018-03-29 ENCOUNTER — Ambulatory Visit (INDEPENDENT_AMBULATORY_CARE_PROVIDER_SITE_OTHER): Payer: Medicare Other | Admitting: Vascular Surgery

## 2018-03-29 ENCOUNTER — Encounter (INDEPENDENT_AMBULATORY_CARE_PROVIDER_SITE_OTHER): Payer: Self-pay

## 2018-03-29 ENCOUNTER — Encounter (INDEPENDENT_AMBULATORY_CARE_PROVIDER_SITE_OTHER): Payer: Self-pay | Admitting: Vascular Surgery

## 2018-03-29 VITALS — BP 171/80 | HR 80 | Resp 16 | Ht 62.0 in | Wt 212.6 lb

## 2018-03-29 DIAGNOSIS — I10 Essential (primary) hypertension: Secondary | ICD-10-CM | POA: Diagnosis not present

## 2018-03-29 DIAGNOSIS — M79604 Pain in right leg: Secondary | ICD-10-CM

## 2018-03-29 DIAGNOSIS — J449 Chronic obstructive pulmonary disease, unspecified: Secondary | ICD-10-CM | POA: Diagnosis not present

## 2018-03-29 DIAGNOSIS — M79605 Pain in left leg: Secondary | ICD-10-CM | POA: Diagnosis not present

## 2018-03-29 DIAGNOSIS — I890 Lymphedema, not elsewhere classified: Secondary | ICD-10-CM

## 2018-03-29 DIAGNOSIS — I714 Abdominal aortic aneurysm, without rupture, unspecified: Secondary | ICD-10-CM

## 2018-03-29 DIAGNOSIS — J4489 Other specified chronic obstructive pulmonary disease: Secondary | ICD-10-CM

## 2018-03-29 DIAGNOSIS — I70413 Atherosclerosis of autologous vein bypass graft(s) of the extremities with intermittent claudication, bilateral legs: Secondary | ICD-10-CM

## 2018-03-29 DIAGNOSIS — R600 Localized edema: Secondary | ICD-10-CM
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
## 2018-04-04 ENCOUNTER — Encounter (INDEPENDENT_AMBULATORY_CARE_PROVIDER_SITE_OTHER): Payer: Self-pay | Admitting: Vascular Surgery

## 2018-04-13 DIAGNOSIS — T162XXA Foreign body in left ear, initial encounter: Secondary | ICD-10-CM | POA: Diagnosis not present

## 2018-04-13 DIAGNOSIS — H903 Sensorineural hearing loss, bilateral: Secondary | ICD-10-CM | POA: Diagnosis not present

## 2018-04-20 ENCOUNTER — Ambulatory Visit (INDEPENDENT_AMBULATORY_CARE_PROVIDER_SITE_OTHER): Payer: Medicare Other | Admitting: Adult Health

## 2018-04-20 ENCOUNTER — Other Ambulatory Visit: Payer: Self-pay

## 2018-04-20 VITALS — BP 138/80 | HR 79 | Resp 16 | Ht 62.0 in | Wt 213.0 lb

## 2018-04-20 DIAGNOSIS — R0602 Shortness of breath: Secondary | ICD-10-CM | POA: Diagnosis not present

## 2018-04-20 NOTE — Progress Notes (Addendum)
c 

## 2018-04-21 ENCOUNTER — Telehealth: Payer: Self-pay

## 2018-04-21 NOTE — Telephone Encounter (Signed)
Faxed order and put in box for [facility] for patient to have portable O2 concentrator. Gail Nelson

## 2018-04-27 DIAGNOSIS — J449 Chronic obstructive pulmonary disease, unspecified: Secondary | ICD-10-CM | POA: Diagnosis not present

## 2018-05-03 ENCOUNTER — Ambulatory Visit: Payer: Medicare Other | Admitting: Internal Medicine

## 2018-05-17 ENCOUNTER — Ambulatory Visit: Payer: Medicare Other | Admitting: Internal Medicine

## 2018-05-18 ENCOUNTER — Other Ambulatory Visit: Payer: Self-pay

## 2018-05-18 ENCOUNTER — Ambulatory Visit: Payer: Medicare Other | Admitting: Internal Medicine

## 2018-05-18 ENCOUNTER — Encounter: Payer: Self-pay | Admitting: Internal Medicine

## 2018-05-18 VITALS — BP 150/78 | HR 100 | Resp 16 | Ht 62.0 in | Wt 213.0 lb

## 2018-05-18 DIAGNOSIS — R0602 Shortness of breath: Secondary | ICD-10-CM

## 2018-05-18 NOTE — Progress Notes (Signed)
Patient was very upset today. Her appointment was at 215PM I came to the room at 230PM. She was angry and did not want to let me talk. I explained to her that I was rounding at the hospital and was delayed due to this. She states that she felt that I was not interested in taking care of her. She told me to stop running my mouth and did not really let me get in a word. I explained to her that it seems that we obviously are not on the same page. I gave her the option to see another pulmonologist in town anyone that she would desire. Patient states that she will find one on her own however a list was given to her. I also told her that she will be able to reach [place] for 30 days from today for emergency issues but beyond this we will no longer be her primary pulmonologist. She asked for a refund from her co-pay which was given to her. Also gave her names of local pulmonologists that would be available to take her case. She did have a spirometry which was within normal limits today. She will not be charged for this.

## 2018-05-21 ENCOUNTER — Ambulatory Visit: Payer: Self-pay | Admitting: Adult Health

## 2018-05-28 DIAGNOSIS — J449 Chronic obstructive pulmonary disease, unspecified: Secondary | ICD-10-CM | POA: Diagnosis not present

## 2018-06-08 ENCOUNTER — Other Ambulatory Visit: Payer: Self-pay

## 2018-06-08 ENCOUNTER — Encounter: Payer: Self-pay | Admitting: Internal Medicine

## 2018-06-08 ENCOUNTER — Ambulatory Visit (INDEPENDENT_AMBULATORY_CARE_PROVIDER_SITE_OTHER): Payer: Medicare Other | Admitting: Internal Medicine

## 2018-06-08 VITALS — BP 160/92 | HR 86 | Ht 62.0 in | Wt 213.4 lb

## 2018-06-08 DIAGNOSIS — J449 Chronic obstructive pulmonary disease, unspecified: Secondary | ICD-10-CM

## 2018-06-08 DIAGNOSIS — J9611 Chronic respiratory failure with hypoxia: Secondary | ICD-10-CM
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
## 2018-06-08 NOTE — Patient Instructions (Signed)
Continue Spiriva As prescribed  Continue Oxygen at Night as prescribed

## 2018-06-27 DIAGNOSIS — J449 Chronic obstructive pulmonary disease, unspecified: Secondary | ICD-10-CM | POA: Diagnosis not present

## 2018-07-15 ENCOUNTER — Ambulatory Visit (INDEPENDENT_AMBULATORY_CARE_PROVIDER_SITE_OTHER): Payer: Medicare Other | Admitting: Adult Health

## 2018-07-15 ENCOUNTER — Encounter: Payer: Self-pay | Admitting: Adult Health

## 2018-07-15 ENCOUNTER — Other Ambulatory Visit: Payer: Self-pay

## 2018-07-15 VITALS — BP 120/84 | HR 78 | Temp 98.6°F | Resp 16 | Ht 62.0 in | Wt 213.0 lb

## 2018-07-15 DIAGNOSIS — M25562 Pain in left knee: Secondary | ICD-10-CM

## 2018-07-15 DIAGNOSIS — I10 Essential (primary) hypertension: Secondary | ICD-10-CM | POA: Diagnosis not present

## 2018-07-15 DIAGNOSIS — J449 Chronic obstructive pulmonary disease, unspecified: Secondary | ICD-10-CM | POA: Diagnosis not present

## 2018-07-15 NOTE — Patient Instructions (Signed)
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
## 2018-07-22 ENCOUNTER — Ambulatory Visit: Payer: Medicare Other | Admitting: Internal Medicine

## 2018-07-23 ENCOUNTER — Ambulatory Visit (INDEPENDENT_AMBULATORY_CARE_PROVIDER_SITE_OTHER): Payer: Medicare Other | Admitting: Nurse Practitioner

## 2018-07-23 ENCOUNTER — Other Ambulatory Visit: Payer: Self-pay

## 2018-07-23 ENCOUNTER — Telehealth: Payer: Self-pay

## 2018-07-23 ENCOUNTER — Encounter: Payer: Self-pay | Admitting: Nurse Practitioner

## 2018-07-23 VITALS — BP 167/92 | HR 82 | Resp 16 | Ht 62.0 in | Wt 214.0 lb

## 2018-07-23 DIAGNOSIS — I10 Essential (primary) hypertension: Secondary | ICD-10-CM

## 2018-07-23 DIAGNOSIS — Z9981 Dependence on supplemental oxygen: Secondary | ICD-10-CM

## 2018-07-23 DIAGNOSIS — J9611 Chronic respiratory failure with hypoxia: Secondary | ICD-10-CM

## 2018-07-23 DIAGNOSIS — M25561 Pain in right knee: Secondary | ICD-10-CM | POA: Diagnosis not present

## 2018-07-23 DIAGNOSIS — M170 Bilateral primary osteoarthritis of knee: Secondary | ICD-10-CM | POA: Diagnosis not present

## 2018-07-23 DIAGNOSIS — M25562 Pain in left knee: Secondary | ICD-10-CM | POA: Diagnosis not present

## 2018-07-23 DIAGNOSIS — R0602 Shortness of breath: Secondary | ICD-10-CM

## 2018-07-23 MED ORDER — LISINOPRIL 5 MG PO TABS: 5.0000 mg | ORAL_TABLET | Freq: Every day | ORAL | 1 refills | Status: DC
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
## 2018-07-23 NOTE — Telephone Encounter (Signed)
-----   Message from Flora Lipps, MD sent at 07/23/2018  1:22 PM EDT ----- Regarding: FW: oxygen Hi Erasmo Downer, can you look into this? Thank you ----- Message ----- From: Lenon Oms, CMA Sent: 07/23/2018   1:14 PM EDT To: Flora Lipps, MD Subject: oxygen                                         My name is Jody and I am a CMA at [facility],  Nira Conn has seen Ms Wince today and her bp was 169/110 and oxygen level was low 89-90 just sitting in the office, ms Salah has been without her oxygen since June 3rd and Nira Conn believes that her bp is so high because of this, she is needing a new oxygen order , she was using [facility] but they had taken her oxygen away. Can you please have somone reach out to ms Kirchgessner and get her oxygen . Thank you for your time   Dorna Bloom CMA

## 2018-07-23 NOTE — Telephone Encounter (Signed)
I spoke to patient. She is in agreance that she needs her oxygen back, but she refuses to use [facility] again as she did not receive the care that she wanted.   Suanne Marker, can we switch DME providers?

## 2018-07-23 NOTE — Telephone Encounter (Signed)
LM on VM for patient to call regarding oxygen.   I called and spoke to [place] at [facility]. The patient had her oxygen picked up 07/07/18 because she was not satisfied with the liquid oxygen and POC device. Per Estill Bamberg, we can put in order for oxygen concentrator and portable tanks with conserving device and they can bring it back out to her. She would not need any additional testing since it has been less than 30 days since pickup. Will await call from patient.

## 2018-07-26 NOTE — Telephone Encounter (Signed)
Pt seen Dr. Mortimer Fries on 07/23/2018 and has had issues with [facility] and her 02 at night.  Pt has used [facility] in the past and wishes to return to use them again.   Pt is aware that an ONO on RA will need to be arranged and we are asking this to be done ASAP as patient has already had [facility] pick up her 02.    Please place order for [facility] to arrange ONO on RA ASAP. Rhonda J Cobb

## 2018-07-26 NOTE — Telephone Encounter (Signed)
Order for ONO has been ordered.

## 2018-07-26 NOTE — Telephone Encounter (Signed)
Pt. Calling back to check on the status of her request for o2 and changing DME's.Gail Nelson

## 2018-07-27 ENCOUNTER — Encounter: Payer: Self-pay | Admitting: Internal Medicine

## 2018-07-27 ENCOUNTER — Telehealth: Payer: Self-pay

## 2018-07-27 DIAGNOSIS — J9611 Chronic respiratory failure with hypoxia: Secondary | ICD-10-CM

## 2018-07-27 DIAGNOSIS — J449 Chronic obstructive pulmonary disease, unspecified: Secondary | ICD-10-CM

## 2018-07-27 NOTE — Telephone Encounter (Signed)
We have spoken to patient, we are in the process of changing her oxygen provider.

## 2018-07-27 NOTE — Telephone Encounter (Signed)
-----   Message from Flora Lipps, MD sent at 07/23/2018  1:22 PM EDT ----- Regarding: FW: oxygen Hi Erasmo Downer, can you look into this? Thank you ----- Message ----- From: Lenon Oms, CMA Sent: 07/23/2018   1:14 PM EDT To: Flora Lipps, MD Subject: oxygen                                         My name is Jody and I am a CMA at [facility],  Nira Conn has seen Ms Doddridge today and her bp was 169/110 and oxygen level was low 89-90 just sitting in the office, ms Brenning has been without her oxygen since June 3rd and Nira Conn believes that her bp is so high because of this, she is needing a new oxygen order , she was using [facility] but they had taken her oxygen away. Can you please have somone reach out to ms Granito and get her oxygen . Thank you for your time   Dorna Bloom CMA

## 2018-07-29 DIAGNOSIS — J9611 Chronic respiratory failure with hypoxia: Secondary | ICD-10-CM | POA: Diagnosis not present

## 2018-07-30 NOTE — Telephone Encounter (Signed)
Pt is aware that ONO has been placed in DK's folder.  Made pt aware that DK is currently on vacation. Pt has requested that another provider review results, as she feels she needs oxygen ASAP.  DR will you please review ONO. I have placed results in your folder.

## 2018-08-02 NOTE — Addendum Note (Signed)
Addended by: Maryanna Shape A on: 08/02/2018 02:22 PM   Modules accepted: Orders

## 2018-08-02 NOTE — Telephone Encounter (Signed)
ONO review by DR, as DK is unavailable.  Pt needs 2L [place].  Pt is aware of results and voiced her understanding. Order has been placed to [facility] per pt request.  Nothing further is needed.

## 2018-08-03 ENCOUNTER — Telehealth: Payer: Self-pay | Admitting: Internal Medicine

## 2018-08-03 DIAGNOSIS — J449 Chronic obstructive pulmonary disease, unspecified: Secondary | ICD-10-CM | POA: Diagnosis not present

## 2018-08-03 NOTE — Telephone Encounter (Signed)
Pt is aware that order for night time oxygen has been sent to [facility]. She is aware that [facility] should be contacting her today. Also made pt aware that she will need to make [facility] aware of her travel plans, and they will deliver a tank for her. She is also aware that a walk test will need to preformed to qualify her for day time oxygen. Pt stated that she was aware of this information and was only calling, due to not hearing from [facility]. She did not wish to schedule a walk test at this time.  Nothing further is needed.

## 2018-08-03 NOTE — Telephone Encounter (Signed)
Left message for pt

## 2018-08-13 ENCOUNTER — Telehealth: Payer: Self-pay | Admitting: Internal Medicine

## 2018-08-13 ENCOUNTER — Ambulatory Visit (INDEPENDENT_AMBULATORY_CARE_PROVIDER_SITE_OTHER): Payer: Medicare Other | Admitting: Nurse Practitioner

## 2018-08-13 ENCOUNTER — Encounter: Payer: Self-pay | Admitting: Internal Medicine

## 2018-08-13 ENCOUNTER — Encounter: Payer: Self-pay | Admitting: Nurse Practitioner

## 2018-08-13 ENCOUNTER — Other Ambulatory Visit: Payer: Self-pay

## 2018-08-13 ENCOUNTER — Ambulatory Visit (INDEPENDENT_AMBULATORY_CARE_PROVIDER_SITE_OTHER): Payer: Medicare Other | Admitting: Internal Medicine

## 2018-08-13 VITALS — BP 168/81 | HR 52 | Temp 97.8°F | Resp 16 | Ht 62.0 in | Wt 212.2 lb

## 2018-08-13 DIAGNOSIS — J449 Chronic obstructive pulmonary disease, unspecified: Secondary | ICD-10-CM

## 2018-08-13 DIAGNOSIS — J9611 Chronic respiratory failure with hypoxia: Secondary | ICD-10-CM | POA: Diagnosis not present

## 2018-08-13 DIAGNOSIS — I70413 Atherosclerosis of autologous vein bypass graft(s) of the extremities with intermittent claudication, bilateral legs: Secondary | ICD-10-CM | POA: Diagnosis not present

## 2018-08-13 DIAGNOSIS — I10 Essential (primary) hypertension: Secondary | ICD-10-CM | POA: Diagnosis not present

## 2018-08-13 MED ORDER — LISINOPRIL 10 MG PO TABS: 10.0000 mg | ORAL_TABLET | Freq: Every day | ORAL | 3 refills | Status: DC

## 2018-08-13 NOTE — Telephone Encounter (Signed)
Called and spoke to pt.  Pt reports of increased sob with exertion and during sleep, non prod cough(cough has worsen since starting oxygen) and wheezing.  Denied fever, chills or sweats.  Pt on 2L [place] with no relief in SOB.  Using spiriva and albuterol once daily with mild relief in sx. Pt has been scheduled for phone visit today at 10:00 with Dr. Mortimer Fries.  Nothing further is needed.
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
## 2018-08-13 NOTE — Patient Instructions (Signed)
Continue inhalers as prescribed Patient needs to be assessed for exertional hypoxia
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
## 2018-08-16 ENCOUNTER — Telehealth: Payer: Self-pay | Admitting: Internal Medicine

## 2018-08-16 ENCOUNTER — Ambulatory Visit: Payer: Medicare Other

## 2018-08-16 ENCOUNTER — Other Ambulatory Visit: Payer: Self-pay

## 2018-08-16 DIAGNOSIS — R0609 Other forms of dyspnea: Secondary | ICD-10-CM

## 2018-08-16 DIAGNOSIS — R0600 Dyspnea, unspecified: Secondary | ICD-10-CM

## 2018-08-16 DIAGNOSIS — J449 Chronic obstructive pulmonary disease, unspecified: Secondary | ICD-10-CM

## 2018-08-16 NOTE — Telephone Encounter (Signed)
Order has been placed to [facility].  Nothing further is needed.

## 2018-08-16 NOTE — Progress Notes (Signed)
Pt in office for qualifying walk. Pt maintained above 90% on 2L.  Pt c/o sob.

## 2018-08-16 NOTE — Telephone Encounter (Signed)
Yes. Please proceed with liquid

## 2018-08-16 NOTE — Telephone Encounter (Signed)
Pt in office for qualifying walk. Pt maintained above 90% on 2L.  Pt is requesting Rx for liquid oxygen, as she can not tolerate concentrator.  DK please advise if okay to order liquid. Thanks

## 2018-08-18 ENCOUNTER — Telehealth: Payer: Self-pay | Admitting: Internal Medicine

## 2018-08-18 NOTE — Telephone Encounter (Signed)
Spoke with patient who states she had a walk test on 08-16-18 for the purpose of getting started on liquid O2 and she was curious of the status of that. Advised pt the the referral/order was placed and sent to [facility] the same day of her test. Pt verbalized understanding and will reach out to them to see when they will deliver her oxygen. No further actions necessary at this time.

## 2018-08-24 ENCOUNTER — Ambulatory Visit (INDEPENDENT_AMBULATORY_CARE_PROVIDER_SITE_OTHER): Payer: Medicare Other

## 2018-08-24 ENCOUNTER — Other Ambulatory Visit: Payer: Self-pay

## 2018-08-24 ENCOUNTER — Other Ambulatory Visit (INDEPENDENT_AMBULATORY_CARE_PROVIDER_SITE_OTHER): Payer: Self-pay | Admitting: Vascular Surgery

## 2018-08-24 ENCOUNTER — Encounter (INDEPENDENT_AMBULATORY_CARE_PROVIDER_SITE_OTHER): Payer: Self-pay

## 2018-08-24 ENCOUNTER — Ambulatory Visit (INDEPENDENT_AMBULATORY_CARE_PROVIDER_SITE_OTHER): Payer: Medicare Other | Admitting: Vascular Surgery

## 2018-08-24 DIAGNOSIS — I714 Abdominal aortic aneurysm, without rupture, unspecified: Secondary | ICD-10-CM

## 2018-08-24 DIAGNOSIS — I70413 Atherosclerosis of autologous vein bypass graft(s) of the extremities with intermittent claudication, bilateral legs: Secondary | ICD-10-CM

## 2018-08-24 DIAGNOSIS — Z95828 Presence of other vascular implants and grafts: Secondary | ICD-10-CM | POA: Diagnosis not present

## 2018-08-24 NOTE — Progress Notes (Signed)
Patient left before being seen.

## 2018-08-25 DIAGNOSIS — J449 Chronic obstructive pulmonary disease, unspecified: Secondary | ICD-10-CM | POA: Diagnosis not present

## 2018-08-31 ENCOUNTER — Encounter (INDEPENDENT_AMBULATORY_CARE_PROVIDER_SITE_OTHER): Payer: Self-pay | Admitting: Vascular Surgery

## 2018-08-31 ENCOUNTER — Ambulatory Visit (INDEPENDENT_AMBULATORY_CARE_PROVIDER_SITE_OTHER): Payer: Medicare Other | Admitting: Vascular Surgery

## 2018-08-31 ENCOUNTER — Other Ambulatory Visit: Payer: Self-pay

## 2018-08-31 VITALS — BP 160/89 | HR 74 | Resp 14 | Ht 62.0 in | Wt 212.0 lb

## 2018-08-31 DIAGNOSIS — I890 Lymphedema, not elsewhere classified: Secondary | ICD-10-CM | POA: Diagnosis not present

## 2018-08-31 DIAGNOSIS — Z95828 Presence of other vascular implants and grafts: Secondary | ICD-10-CM

## 2018-08-31 DIAGNOSIS — I714 Abdominal aortic aneurysm, without rupture, unspecified: Secondary | ICD-10-CM

## 2018-08-31 DIAGNOSIS — Z87891 Personal history of nicotine dependence: Secondary | ICD-10-CM

## 2018-08-31 DIAGNOSIS — Z79899 Other long term (current) drug therapy: Secondary | ICD-10-CM

## 2018-08-31 DIAGNOSIS — I10 Essential (primary) hypertension: Secondary | ICD-10-CM | POA: Diagnosis not present

## 2018-08-31 NOTE — Assessment & Plan Note (Signed)
blood pressure control important in reducing the progression of atherosclerotic disease. On appropriate oral medications.  
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
## 2018-08-31 NOTE — Assessment & Plan Note (Signed)
Her most recent duplex showed some sac growth but no obvious endoleak with a patent stent graft.  This now measures a little over 6 cm in diameter. With this in mind, I think we need to shorten her follow-up and I will plan to see her back in 6 months with an aortic stent graft duplex.  She will contact our office with any problems in the interim.

## 2018-08-31 NOTE — Assessment & Plan Note (Signed)
With the use of the lymphedema pump her symptoms are essentially resolved at this point.  Uses this 1 hour daily and it is doing a great job.

## 2018-09-02 ENCOUNTER — Ambulatory Visit (INDEPENDENT_AMBULATORY_CARE_PROVIDER_SITE_OTHER): Payer: Medicare Other | Admitting: Internal Medicine

## 2018-09-02 ENCOUNTER — Encounter: Payer: Self-pay | Admitting: Internal Medicine

## 2018-09-02 ENCOUNTER — Other Ambulatory Visit: Payer: Self-pay

## 2018-09-02 VITALS — BP 166/84 | HR 96 | Temp 97.5°F | Ht 64.0 in | Wt 201.0 lb

## 2018-09-02 DIAGNOSIS — J9611 Chronic respiratory failure with hypoxia: Secondary | ICD-10-CM | POA: Diagnosis not present

## 2018-09-02 DIAGNOSIS — J449 Chronic obstructive pulmonary disease, unspecified: Secondary | ICD-10-CM | POA: Diagnosis not present

## 2018-09-02 NOTE — Patient Instructions (Signed)
Plan for Tank Prescription for Oxygen  Continue Oxygen as prescribed  Start Flutter Valve 10-15 times per day  Start Incentive Spirometry 10 -15 times per day  Continue SPIRIVA HandiHaler as prescribed
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
## 2018-09-07 DIAGNOSIS — J449 Chronic obstructive pulmonary disease, unspecified: Secondary | ICD-10-CM | POA: Diagnosis not present

## 2018-09-16 ENCOUNTER — Telehealth: Payer: Self-pay | Admitting: Nurse Practitioner

## 2018-09-16 ENCOUNTER — Ambulatory Visit (INDEPENDENT_AMBULATORY_CARE_PROVIDER_SITE_OTHER): Payer: Medicare Other | Admitting: Nurse Practitioner

## 2018-09-16 ENCOUNTER — Other Ambulatory Visit: Payer: Self-pay | Admitting: Nurse Practitioner

## 2018-09-16 ENCOUNTER — Other Ambulatory Visit: Payer: Self-pay

## 2018-09-16 ENCOUNTER — Encounter: Payer: Self-pay | Admitting: Nurse Practitioner

## 2018-09-16 VITALS — BP 155/75 | HR 79 | Resp 16 | Ht 64.0 in | Wt 212.0 lb

## 2018-09-16 DIAGNOSIS — R059 Cough, unspecified: Secondary | ICD-10-CM

## 2018-09-16 DIAGNOSIS — I10 Essential (primary) hypertension: Secondary | ICD-10-CM | POA: Diagnosis not present

## 2018-09-16 DIAGNOSIS — R05 Cough: Secondary | ICD-10-CM

## 2018-09-16 DIAGNOSIS — J069 Acute upper respiratory infection, unspecified: Secondary | ICD-10-CM | POA: Diagnosis not present

## 2018-09-16 MED ORDER — HYDROCOD POLST-CPM POLST ER 10-8 MG/5ML PO SUER: 5.0000 mL | Freq: Two times a day (BID) | ORAL | 0 refills | Status: DC | PRN

## 2018-09-16 MED ORDER — AZITHROMYCIN 250 MG PO TABS: ORAL_TABLET | ORAL | 0 refills | Status: DC

## 2018-09-16 NOTE — Progress Notes (Signed)
Sent tussionex to [facility] in [place].

## 2018-09-16 NOTE — Telephone Encounter (Signed)
Medication that was prescribed to her this morning the pharmacy does not have and does not know when they will get in ,

## 2018-09-16 NOTE — Telephone Encounter (Signed)
PT  CAN RECEIVE TUSSIONEX AT [facility]
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
## 2018-09-16 NOTE — Telephone Encounter (Signed)
Sent tussionex to [facility] in [place].

## 2018-09-16 NOTE — Telephone Encounter (Signed)
I prescribed her z-pack and tussionex for cough. Is there another pharmacy she wants me to send it to?

## 2018-09-17 ENCOUNTER — Other Ambulatory Visit: Payer: Self-pay | Admitting: Nurse Practitioner

## 2018-09-17 NOTE — Telephone Encounter (Signed)
PT IS NO LONGER USING [facility] CHANGED TO [facility]

## 2018-09-21 ENCOUNTER — Other Ambulatory Visit: Payer: Self-pay | Admitting: Nurse Practitioner

## 2018-09-21 ENCOUNTER — Other Ambulatory Visit: Payer: Self-pay

## 2018-09-21 ENCOUNTER — Telehealth: Payer: Self-pay | Admitting: Nurse Practitioner

## 2018-09-21 DIAGNOSIS — I10 Essential (primary) hypertension: Secondary | ICD-10-CM

## 2018-09-21 MED ORDER — CARVEDILOL 3.125 MG PO TABS: 3.1250 mg | ORAL_TABLET | Freq: Two times a day (BID) | ORAL | 0 refills | Status: DC

## 2018-09-21 MED ORDER — CARVEDILOL 3.125 MG PO TABS: 3.1250 mg | ORAL_TABLET | Freq: Two times a day (BID) | ORAL | 1 refills | Status: AC

## 2018-09-21 NOTE — Progress Notes (Signed)
Sent 90 day prescription for coreg 3.125mg  bid to [facility] in [place]

## 2018-09-21 NOTE — Telephone Encounter (Signed)
Sent 90 day prescription for coreg 3.125mg  bid to [facility] in [place]

## 2018-09-25 DIAGNOSIS — J449 Chronic obstructive pulmonary disease, unspecified: Secondary | ICD-10-CM | POA: Diagnosis not present

## 2018-09-28 ENCOUNTER — Other Ambulatory Visit: Payer: Self-pay

## 2018-09-28 MED ORDER — SPIRIVA HANDIHALER 18 MCG IN CAPS: ORAL_CAPSULE | RESPIRATORY_TRACT | 6 refills | Status: DC

## 2018-09-29 ENCOUNTER — Other Ambulatory Visit: Payer: Self-pay | Admitting: Internal Medicine

## 2018-09-29 ENCOUNTER — Other Ambulatory Visit: Payer: Self-pay

## 2018-09-29 MED ORDER — SPIRIVA HANDIHALER 18 MCG IN CAPS: ORAL_CAPSULE | RESPIRATORY_TRACT | 6 refills | Status: DC

## 2018-09-29 NOTE — Telephone Encounter (Signed)
Rx for Spiriva has been sent to preferred pharmacy. Pt is aware and voiced her understanding.  Nothing further is needed.

## 2018-09-29 NOTE — Telephone Encounter (Signed)
Patient is returning phone call.  Patient phone number is [phone].

## 2018-09-29 NOTE — Telephone Encounter (Signed)
Left message for pt

## 2018-10-03 DIAGNOSIS — J449 Chronic obstructive pulmonary disease, unspecified: Secondary | ICD-10-CM | POA: Diagnosis not present

## 2018-10-19 ENCOUNTER — Other Ambulatory Visit: Payer: Self-pay | Admitting: Nurse Practitioner

## 2018-10-19 DIAGNOSIS — I10 Essential (primary) hypertension: Secondary | ICD-10-CM

## 2018-10-19 MED ORDER — LISINOPRIL 10 MG PO TABS: 10.0000 mg | ORAL_TABLET | Freq: Every day | ORAL | 3 refills | Status: DC

## 2018-10-25 ENCOUNTER — Ambulatory Visit (INDEPENDENT_AMBULATORY_CARE_PROVIDER_SITE_OTHER): Payer: Medicare Other | Admitting: Adult Health

## 2018-10-25 ENCOUNTER — Encounter: Payer: Self-pay | Admitting: Adult Health

## 2018-10-25 ENCOUNTER — Other Ambulatory Visit: Payer: Self-pay

## 2018-10-25 VITALS — Temp 98.9°F

## 2018-10-25 DIAGNOSIS — R059 Cough, unspecified: Secondary | ICD-10-CM

## 2018-10-25 DIAGNOSIS — R05 Cough: Secondary | ICD-10-CM

## 2018-10-25 DIAGNOSIS — J069 Acute upper respiratory infection, unspecified: Secondary | ICD-10-CM | POA: Diagnosis not present

## 2018-10-25 DIAGNOSIS — J449 Chronic obstructive pulmonary disease, unspecified: Secondary | ICD-10-CM | POA: Diagnosis not present

## 2018-10-25 DIAGNOSIS — J9611 Chronic respiratory failure with hypoxia: Secondary | ICD-10-CM

## 2018-10-25 MED ORDER — HYDROCOD POLST-CPM POLST ER 10-8 MG/5ML PO SUER: 5.0000 mL | Freq: Two times a day (BID) | ORAL | 0 refills | Status: DC | PRN

## 2018-10-25 MED ORDER — AZITHROMYCIN 250 MG PO TABS: ORAL_TABLET | ORAL | 0 refills | Status: DC
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
## 2018-10-26 DIAGNOSIS — J449 Chronic obstructive pulmonary disease, unspecified: Secondary | ICD-10-CM | POA: Diagnosis not present

## 2018-11-03 DIAGNOSIS — J449 Chronic obstructive pulmonary disease, unspecified: Secondary | ICD-10-CM | POA: Diagnosis not present

## 2018-11-23 ENCOUNTER — Ambulatory Visit (INDEPENDENT_AMBULATORY_CARE_PROVIDER_SITE_OTHER): Payer: Medicare Other | Admitting: Nurse Practitioner

## 2018-11-23 VITALS — Temp 97.6°F

## 2018-11-23 DIAGNOSIS — J069 Acute upper respiratory infection, unspecified: Secondary | ICD-10-CM | POA: Diagnosis not present

## 2018-11-23 DIAGNOSIS — J300 Vasomotor rhinitis: Secondary | ICD-10-CM | POA: Diagnosis not present

## 2018-11-23 MED ORDER — FLUTICASONE PROPIONATE 50 MCG/ACT NA SUSP: 2.0000 | Freq: Every day | NASAL | 2 refills | Status: DC

## 2018-11-23 MED ORDER — AZITHROMYCIN 250 MG PO TABS: ORAL_TABLET | ORAL | 0 refills | Status: DC
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
## 2018-11-25 DIAGNOSIS — J449 Chronic obstructive pulmonary disease, unspecified: Secondary | ICD-10-CM | POA: Diagnosis not present

## 2018-12-03 DIAGNOSIS — J449 Chronic obstructive pulmonary disease, unspecified: Secondary | ICD-10-CM | POA: Diagnosis not present

## 2018-12-08 ENCOUNTER — Encounter: Payer: Self-pay | Admitting: Nurse Practitioner

## 2018-12-08 DIAGNOSIS — J069 Acute upper respiratory infection, unspecified: Secondary | ICD-10-CM | POA: Insufficient documentation

## 2018-12-08 DIAGNOSIS — J300 Vasomotor rhinitis: Secondary | ICD-10-CM | POA: Insufficient documentation

## 2018-12-26 DIAGNOSIS — J449 Chronic obstructive pulmonary disease, unspecified: Secondary | ICD-10-CM | POA: Diagnosis not present

## 2019-01-03 DIAGNOSIS — J449 Chronic obstructive pulmonary disease, unspecified: Secondary | ICD-10-CM | POA: Diagnosis not present

## 2019-01-05 ENCOUNTER — Telehealth: Payer: Self-pay

## 2019-01-05 ENCOUNTER — Other Ambulatory Visit: Payer: Self-pay

## 2019-01-05 DIAGNOSIS — J069 Acute upper respiratory infection, unspecified: Secondary | ICD-10-CM

## 2019-01-05 MED ORDER — AZITHROMYCIN 250 MG PO TABS: ORAL_TABLET | ORAL | 0 refills | Status: DC

## 2019-01-05 NOTE — Telephone Encounter (Signed)
Pt called that she having sinus infection we send zpak as per DFk

## 2019-01-06 ENCOUNTER — Telehealth: Payer: Self-pay

## 2019-01-06 MED ORDER — HYDROCOD POLST-CPM POLST ER 10-8 MG/5ML PO SUER: 5.0000 mL | Freq: Two times a day (BID) | ORAL | 0 refills | Status: DC | PRN

## 2019-01-06 NOTE — Telephone Encounter (Signed)
Pt was notified.  

## 2019-01-06 NOTE — Telephone Encounter (Signed)
RX for Tussionex is sent

## 2019-01-24 ENCOUNTER — Other Ambulatory Visit: Payer: Self-pay | Admitting: Adult Health

## 2019-01-24 ENCOUNTER — Other Ambulatory Visit: Payer: Self-pay

## 2019-01-24 ENCOUNTER — Ambulatory Visit
Admission: RE | Admit: 2019-01-24 | Discharge: 2019-01-24 | Disposition: A | Discharge status: Home / Self Care | Payer: Medicare Other | Source: Ambulatory Visit | Attending: Adult Health | Admitting: Adult Health

## 2019-01-24 DIAGNOSIS — R05 Cough: Secondary | ICD-10-CM | POA: Insufficient documentation

## 2019-01-24 DIAGNOSIS — R059 Cough, unspecified: Secondary | ICD-10-CM

## 2019-01-24 DIAGNOSIS — R0602 Shortness of breath: Secondary | ICD-10-CM | POA: Diagnosis not present

## 2019-01-24 DIAGNOSIS — R079 Chest pain, unspecified: Secondary | ICD-10-CM | POA: Diagnosis not present

## 2019-01-24 NOTE — Progress Notes (Signed)
CXR order for patient for ongoing cough/sob.  At her request.

## 2019-01-25 DIAGNOSIS — J449 Chronic obstructive pulmonary disease, unspecified: Secondary | ICD-10-CM | POA: Diagnosis not present

## 2019-01-26 ENCOUNTER — Other Ambulatory Visit: Payer: Self-pay | Admitting: Adult Health

## 2019-01-26 DIAGNOSIS — J90 Pleural effusion, not elsewhere classified: Secondary | ICD-10-CM

## 2019-01-27 ENCOUNTER — Telehealth: Payer: Self-pay

## 2019-01-27 NOTE — Telephone Encounter (Signed)
Patient scheduled for CT chest w/ contrast on 02/09/2019 @ 2pm kp/tat

## 2019-01-31 ENCOUNTER — Other Ambulatory Visit: Payer: Self-pay

## 2019-01-31 ENCOUNTER — Encounter: Payer: Self-pay | Admitting: Adult Health

## 2019-01-31 ENCOUNTER — Ambulatory Visit (INDEPENDENT_AMBULATORY_CARE_PROVIDER_SITE_OTHER): Payer: Medicare Other | Admitting: Adult Health

## 2019-01-31 ENCOUNTER — Other Ambulatory Visit: Payer: Self-pay | Admitting: Adult Health

## 2019-01-31 VITALS — BP 150/82 | HR 88 | Resp 16 | Ht 62.0 in | Wt 214.0 lb

## 2019-01-31 DIAGNOSIS — I10 Essential (primary) hypertension: Secondary | ICD-10-CM

## 2019-01-31 DIAGNOSIS — J0101 Acute recurrent maxillary sinusitis: Secondary | ICD-10-CM

## 2019-01-31 DIAGNOSIS — T464X5A Adverse effect of angiotensin-converting-enzyme inhibitors, initial encounter: Secondary | ICD-10-CM

## 2019-01-31 DIAGNOSIS — R058 Other specified cough: Secondary | ICD-10-CM

## 2019-01-31 DIAGNOSIS — Z13820 Encounter for screening for osteoporosis: Secondary | ICD-10-CM

## 2019-01-31 DIAGNOSIS — J9611 Chronic respiratory failure with hypoxia: Secondary | ICD-10-CM | POA: Diagnosis not present

## 2019-01-31 DIAGNOSIS — R05 Cough: Secondary | ICD-10-CM | POA: Diagnosis not present

## 2019-01-31 MED ORDER — LOSARTAN POTASSIUM 50 MG PO TABS: 50.0000 mg | ORAL_TABLET | Freq: Every day | ORAL | 1 refills | Status: DC

## 2019-01-31 MED ORDER — DOXYCYCLINE HYCLATE 100 MG PO CAPS: 100.0000 mg | ORAL_CAPSULE | Freq: Every day | ORAL | 0 refills | Status: DC

## 2019-01-31 MED ORDER — DOXYCYCLINE HYCLATE 100 MG PO TABS: 100.0000 mg | ORAL_TABLET | Freq: Every day | ORAL | 0 refills | Status: DC
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
## 2019-02-02 DIAGNOSIS — J449 Chronic obstructive pulmonary disease, unspecified: Secondary | ICD-10-CM | POA: Diagnosis not present

## 2019-02-09 ENCOUNTER — Other Ambulatory Visit: Payer: Self-pay

## 2019-02-09 ENCOUNTER — Ambulatory Visit
Admission: RE | Admit: 2019-02-09 | Discharge: 2019-02-09 | Disposition: A | Discharge status: Home / Self Care | Payer: Medicare Other | Source: Ambulatory Visit | Attending: Adult Health | Admitting: Adult Health

## 2019-02-09 DIAGNOSIS — R918 Other nonspecific abnormal finding of lung field: Secondary | ICD-10-CM | POA: Diagnosis not present

## 2019-02-09 DIAGNOSIS — J90 Pleural effusion, not elsewhere classified: Secondary | ICD-10-CM | POA: Diagnosis not present

## 2019-02-09 HISTORY — DX: Malignant neoplasm of unspecified site of left female breast: C50.912

## 2019-02-09 LAB — POCT I-STAT CREATININE: Creatinine, Ser: 1 mg/dL (ref 0.44–1.00)

## 2019-02-09 MED ORDER — IOHEXOL 300 MG/ML  SOLN
75.0000 mL | Freq: Once | INTRAMUSCULAR | Status: AC | PRN
  Administered 2019-02-09: 75 mL via INTRAVENOUS

## 2019-02-10 ENCOUNTER — Telehealth: Payer: Self-pay

## 2019-02-10 NOTE — Telephone Encounter (Signed)
Confirmed appointment with patient. klh °

## 2019-02-14 ENCOUNTER — Other Ambulatory Visit: Payer: Self-pay

## 2019-02-14 ENCOUNTER — Ambulatory Visit (INDEPENDENT_AMBULATORY_CARE_PROVIDER_SITE_OTHER): Payer: Medicare Other | Admitting: Adult Health

## 2019-02-14 ENCOUNTER — Encounter: Payer: Self-pay | Admitting: Adult Health

## 2019-02-14 VITALS — BP 138/80 | HR 76 | Temp 97.5°F | Resp 16 | Ht 62.0 in | Wt 216.0 lb

## 2019-02-14 DIAGNOSIS — R918 Other nonspecific abnormal finding of lung field: Secondary | ICD-10-CM

## 2019-02-14 DIAGNOSIS — R05 Cough: Secondary | ICD-10-CM | POA: Diagnosis not present

## 2019-02-14 DIAGNOSIS — I10 Essential (primary) hypertension: Secondary | ICD-10-CM | POA: Diagnosis not present

## 2019-02-14 DIAGNOSIS — R059 Cough, unspecified: Secondary | ICD-10-CM

## 2019-02-14 MED ORDER — HYDROCOD POLST-CPM POLST ER 10-8 MG/5ML PO SUER: 5.0000 mL | Freq: Two times a day (BID) | ORAL | 0 refills | Status: DC | PRN
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
## 2019-02-16 ENCOUNTER — Telehealth: Payer: Self-pay

## 2019-02-16 ENCOUNTER — Other Ambulatory Visit: Payer: Self-pay | Admitting: Adult Health

## 2019-02-16 MED ORDER — FLUTICASONE-SALMETEROL 250-50 MCG/DOSE IN AEPB: 1.0000 | INHALATION_SPRAY | Freq: Two times a day (BID) | RESPIRATORY_TRACT | 3 refills | Status: DC

## 2019-02-16 NOTE — Progress Notes (Signed)
Pt can not afford symbicort, RX for advair sent at this time.

## 2019-02-16 NOTE — Telephone Encounter (Signed)
Sent advair

## 2019-02-17 ENCOUNTER — Telehealth: Payer: Self-pay

## 2019-02-17 NOTE — Telephone Encounter (Signed)
Left a message giving patient information on PET scan date. Gail [place]

## 2019-02-24 ENCOUNTER — Encounter: Payer: Self-pay | Admitting: Internal Medicine

## 2019-02-24 ENCOUNTER — Telehealth: Payer: Self-pay | Admitting: Internal Medicine

## 2019-02-24 ENCOUNTER — Ambulatory Visit: Payer: Medicare Other | Admitting: Internal Medicine

## 2019-02-24 VITALS — BP 150/88 | HR 104 | Temp 97.3°F | Ht 62.0 in | Wt 215.0 lb

## 2019-02-24 DIAGNOSIS — J449 Chronic obstructive pulmonary disease, unspecified: Secondary | ICD-10-CM | POA: Diagnosis not present

## 2019-02-24 DIAGNOSIS — J9611 Chronic respiratory failure with hypoxia: Secondary | ICD-10-CM | POA: Diagnosis not present
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
## 2019-02-24 NOTE — Telephone Encounter (Signed)
Patient in for appointment and requesting liquid oxygen. Patient was on it in 2006. Contacted [employer] and was told they stopped providing it last year. Patient informed they no longer provide it. Patient stated that she does not want the concentrator or the portable oxygen that she has at home and would like for an order to be placed for them to come pick up all supplies. Informed patient that we would check with Dr.Kasa about placing the order and try to find a DME that provides liquid oxygen. Patient expressed understanding

## 2019-02-24 NOTE — Patient Instructions (Signed)
Continue inhalers as prescribed Continue oxygen as prescribed

## 2019-02-25 ENCOUNTER — Other Ambulatory Visit: Payer: Self-pay

## 2019-02-25 DIAGNOSIS — J300 Vasomotor rhinitis: Secondary | ICD-10-CM

## 2019-02-25 MED ORDER — FLUTICASONE PROPIONATE 50 MCG/ACT NA SUSP: 2.0000 | Freq: Every day | NASAL | 2 refills | Status: AC

## 2019-02-28 ENCOUNTER — Other Ambulatory Visit: Payer: Self-pay | Admitting: Adult Health

## 2019-02-28 ENCOUNTER — Other Ambulatory Visit: Payer: Self-pay

## 2019-02-28 ENCOUNTER — Encounter
Admission: RE | Admit: 2019-02-28 | Discharge: 2019-02-28 | Disposition: A | Discharge status: Home / Self Care | Payer: Medicare Other | Source: Ambulatory Visit | Attending: Adult Health | Admitting: Adult Health

## 2019-02-28 DIAGNOSIS — I10 Essential (primary) hypertension: Secondary | ICD-10-CM

## 2019-02-28 DIAGNOSIS — I2510 Atherosclerotic heart disease of native coronary artery without angina pectoris: Secondary | ICD-10-CM | POA: Insufficient documentation

## 2019-02-28 DIAGNOSIS — R918 Other nonspecific abnormal finding of lung field: Secondary | ICD-10-CM | POA: Insufficient documentation

## 2019-02-28 DIAGNOSIS — I714 Abdominal aortic aneurysm, without rupture: Secondary | ICD-10-CM | POA: Diagnosis not present

## 2019-02-28 LAB — GLUCOSE, CAPILLARY: Glucose-Capillary: 114 mg/dL — ABNORMAL HIGH (ref 70–99)

## 2019-02-28 MED ORDER — FLUDEOXYGLUCOSE F - 18 (FDG) INJECTION
10.9600 | Freq: Once | INTRAVENOUS | Status: AC | PRN
  Administered 2019-02-28: 10.96 via INTRAVENOUS

## 2019-03-02 ENCOUNTER — Telehealth: Payer: Self-pay

## 2019-03-02 NOTE — Telephone Encounter (Signed)
Confirmed appointment with patient and screened for covid. kl[facility] 

## 2019-03-03 ENCOUNTER — Encounter: Payer: Self-pay | Admitting: Internal Medicine

## 2019-03-03 ENCOUNTER — Ambulatory Visit: Payer: Medicare Other | Admitting: Internal Medicine

## 2019-03-03 ENCOUNTER — Other Ambulatory Visit: Payer: Self-pay

## 2019-03-03 VITALS — BP 144/84 | HR 68 | Resp 16 | Ht 62.0 in | Wt 214.0 lb

## 2019-03-03 DIAGNOSIS — Z9981 Dependence on supplemental oxygen: Secondary | ICD-10-CM | POA: Diagnosis not present

## 2019-03-03 DIAGNOSIS — R918 Other nonspecific abnormal finding of lung field: Secondary | ICD-10-CM

## 2019-03-03 DIAGNOSIS — R0602 Shortness of breath: Secondary | ICD-10-CM

## 2019-03-03 DIAGNOSIS — J449 Chronic obstructive pulmonary disease, unspecified: Secondary | ICD-10-CM
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
## 2019-03-03 NOTE — Patient Instructions (Signed)
Lung Mass  A lung mass is a growth in the lung that is larger than 3 centimeters (1.2 inches). Smaller growths are called nodules. Most lung nodules are not cancer. Lung masses have a higher risk of being cancer. A lung mass is sometimes found during a routine chest X-ray or while doing other imaging tests to check for other problems. What are common types of lung masses? Lung masses include:  Tumors. These may be cancerous (malignant) or noncancerous (benign).  Infectious masses (granulomas). These are masses caused by inflammation from bacterial infections, like tuberculosis, or fungal infections.  Noninfectious masses. Some diseases that cause lung inflammation may also cause lung masses to form.  Blood vessel malformations. What type of testing may be needed? Your health care provider may recommend that you have tests to diagnose the cause of your lung mass. The following tests may be done if a lung mass is found:  Physical exam.  Imaging tests, such as: ? Chest X-rays. ? CT scan. ? PET scan. This scan measures how much energy a mass is using.  Biopsy to rule out cancer or confirm a diagnosis. This procedure involves removing a tissue sample from the mass with a needle inserted through the chest, using a scope placed down into the lung, or through open surgery. Tests and physical exams may be done once, or they may be done regularly for a period of time. Tests and exams that are done regularly will help monitor whether the mass or tissue change is growing and becoming a concern. What are common treatments? Treatment for a lung mass depends on the cause. Noncancerous masses may require treatment specific to the cause. Treatment options for a cancerous lung mass may include:  Surgical removal.  Radiation therapy.  Chemotherapy. Follow these instructions at home: If you have had surgery or a biopsy, your health care provider will give you specific instructions for taking care of  yourself at home after your procedure. Follow these instructions carefully. General home care instructions include:  Take over-the-counter and prescription medicines only as told by your health care provider.  Return to your normal activities as told by your health care provider. Ask your health care provider what activities are safe for you.  Do not use any products that contain nicotine or tobacco, such as cigarettes, e-cigarettes, and chewing tobacco. If you need help quitting, ask your health care provider.  Keep all follow-up visits as told by your health care provider. This is important. Contact a health care provider if you:  Have pain in your chest, back, or shoulder.  Are short of breath.  Have a cough.  Cough up blood or bloody sputum. Summary  A lung mass is a growth in the lung that is larger than 3 centimeters (1.2 inches).  Lung masses have a higher risk of being cancer than do smaller growths.  Sometimes a lung mass is found during a routine chest X-ray or other imaging test.  Your health care provider may do a biopsy of the lung mass to rule out or confirm cancer.  Treatment for this condition depends on the cause. This information is not intended to replace advice given to you by your health care provider. Make sure you discuss any questions you have with your health care provider. Document Revised: 05/14/2018 Document Reviewed: 09/15/2017 Elsevier Patient Education  [place].

## 2019-03-04 ENCOUNTER — Other Ambulatory Visit (INDEPENDENT_AMBULATORY_CARE_PROVIDER_SITE_OTHER): Payer: Medicare Other

## 2019-03-04 ENCOUNTER — Other Ambulatory Visit: Payer: Self-pay | Admitting: Internal Medicine

## 2019-03-04 ENCOUNTER — Ambulatory Visit (INDEPENDENT_AMBULATORY_CARE_PROVIDER_SITE_OTHER): Payer: Medicare Other | Admitting: Vascular Surgery

## 2019-03-07 ENCOUNTER — Other Ambulatory Visit: Admission: RE | Admit: 2019-03-07 | Payer: Medicare Other | Source: Ambulatory Visit

## 2019-03-08 ENCOUNTER — Encounter: Payer: Self-pay | Admitting: Adult Health

## 2019-03-08 NOTE — Progress Notes (Signed)
Scanned patient disability paperwork for pt daughter ,put copy up front for pick up. beth

## 2019-03-08 NOTE — Progress Notes (Unsigned)
Scanned patient disability paperwork for pt daughter ,put copy up front for pick up. beth

## 2019-03-10 ENCOUNTER — Telehealth: Payer: Self-pay

## 2019-03-10 NOTE — Telephone Encounter (Signed)
Confirmed appointment on 03/14/2019 and screened for covid. [facility]

## 2019-03-11 ENCOUNTER — Other Ambulatory Visit
Admission: RE | Admit: 2019-03-11 | Discharge: 2019-03-11 | Disposition: A | Discharge status: Home / Self Care | Payer: Medicare Other | Source: Ambulatory Visit | Attending: Internal Medicine | Admitting: Internal Medicine

## 2019-03-11 DIAGNOSIS — Z20822 Contact with and (suspected) exposure to covid-19: Secondary | ICD-10-CM | POA: Diagnosis not present

## 2019-03-11 DIAGNOSIS — Z01812 Encounter for preprocedural laboratory examination: Secondary | ICD-10-CM | POA: Insufficient documentation

## 2019-03-11 LAB — SARS CORONAVIRUS 2 (TAT 6-24 HRS): SARS Coronavirus 2: NEGATIVE

## 2019-03-14 ENCOUNTER — Other Ambulatory Visit: Payer: Self-pay

## 2019-03-14 ENCOUNTER — Encounter: Payer: Self-pay | Admitting: Nurse Practitioner

## 2019-03-14 ENCOUNTER — Other Ambulatory Visit: Payer: Self-pay | Admitting: Radiology

## 2019-03-14 ENCOUNTER — Ambulatory Visit (INDEPENDENT_AMBULATORY_CARE_PROVIDER_SITE_OTHER): Payer: Medicare Other | Admitting: Nurse Practitioner

## 2019-03-14 VITALS — BP 132/68 | HR 88 | Temp 97.1°F | Resp 16 | Ht 62.0 in | Wt 211.2 lb

## 2019-03-14 DIAGNOSIS — J449 Chronic obstructive pulmonary disease, unspecified: Secondary | ICD-10-CM | POA: Diagnosis not present

## 2019-03-14 DIAGNOSIS — D381 Neoplasm of uncertain behavior of trachea, bronchus and lung: Secondary | ICD-10-CM

## 2019-03-14 DIAGNOSIS — R300 Dysuria: Secondary | ICD-10-CM | POA: Diagnosis not present

## 2019-03-14 DIAGNOSIS — Z0001 Encounter for general adult medical examination with abnormal findings: Secondary | ICD-10-CM

## 2019-03-14 DIAGNOSIS — I10 Essential (primary) hypertension: Secondary | ICD-10-CM

## 2019-03-14 DIAGNOSIS — R0602 Shortness of breath: Secondary | ICD-10-CM

## 2019-03-14 MED ORDER — ALBUTEROL SULFATE (2.5 MG/3ML) 0.083% IN NEBU: 2.5000 mg | INHALATION_SOLUTION | Freq: Four times a day (QID) | RESPIRATORY_TRACT | 3 refills | Status: AC | PRN
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
## 2019-03-15 ENCOUNTER — Other Ambulatory Visit: Payer: Self-pay | Admitting: Radiology

## 2019-03-15 ENCOUNTER — Ambulatory Visit
Admission: RE | Admit: 2019-03-15 | Discharge: 2019-03-15 | Disposition: A | Discharge status: Home / Self Care | Payer: Medicare Other | Source: Ambulatory Visit | Attending: Interventional Radiology | Admitting: Interventional Radiology

## 2019-03-15 ENCOUNTER — Ambulatory Visit
Admission: RE | Admit: 2019-03-15 | Discharge: 2019-03-15 | Disposition: A | Discharge status: Home / Self Care | Payer: Medicare Other | Source: Ambulatory Visit | Attending: Internal Medicine | Admitting: Internal Medicine

## 2019-03-15 DIAGNOSIS — Z7951 Long term (current) use of inhaled steroids: Secondary | ICD-10-CM | POA: Diagnosis not present

## 2019-03-15 DIAGNOSIS — Z9012 Acquired absence of left breast and nipple: Secondary | ICD-10-CM | POA: Diagnosis not present

## 2019-03-15 DIAGNOSIS — Z853 Personal history of malignant neoplasm of breast: Secondary | ICD-10-CM | POA: Insufficient documentation

## 2019-03-15 DIAGNOSIS — J449 Chronic obstructive pulmonary disease, unspecified: Secondary | ICD-10-CM | POA: Insufficient documentation

## 2019-03-15 DIAGNOSIS — R918 Other nonspecific abnormal finding of lung field: Secondary | ICD-10-CM

## 2019-03-15 DIAGNOSIS — C3491 Malignant neoplasm of unspecified part of right bronchus or lung: Secondary | ICD-10-CM | POA: Diagnosis not present

## 2019-03-15 DIAGNOSIS — I10 Essential (primary) hypertension: Secondary | ICD-10-CM | POA: Diagnosis not present

## 2019-03-15 DIAGNOSIS — J95811 Postprocedural pneumothorax: Secondary | ICD-10-CM

## 2019-03-15 DIAGNOSIS — Z87891 Personal history of nicotine dependence: Secondary | ICD-10-CM | POA: Diagnosis not present

## 2019-03-15 DIAGNOSIS — C3431 Malignant neoplasm of lower lobe, right bronchus or lung: Secondary | ICD-10-CM | POA: Diagnosis not present

## 2019-03-15 DIAGNOSIS — Z79899 Other long term (current) drug therapy: Secondary | ICD-10-CM | POA: Diagnosis not present

## 2019-03-15 DIAGNOSIS — R911 Solitary pulmonary nodule: Secondary | ICD-10-CM | POA: Diagnosis not present

## 2019-03-15 LAB — CBC
HCT: 45.8 % (ref 36.0–46.0)
Hemoglobin: 14.6 g/dL (ref 12.0–15.0)
MCH: 28.8 pg (ref 26.0–34.0)
MCHC: 31.9 g/dL (ref 30.0–36.0)
MCV: 90.3 fL (ref 80.0–100.0)
Platelets: 176 10*3/uL (ref 150–400)
RBC: 5.07 MIL/uL (ref 3.87–5.11)
RDW: 13.8 % (ref 11.5–15.5)
WBC: 9.8 10*3/uL (ref 4.0–10.5)
nRBC: 0 % (ref 0.0–0.2)

## 2019-03-15 LAB — UA/M W/RFLX CULTURE, ROUTINE
Bilirubin, UA: NEGATIVE
Glucose, UA: NEGATIVE
Ketones, UA: NEGATIVE
Leukocytes,UA: NEGATIVE
Nitrite, UA: NEGATIVE
Protein,UA: NEGATIVE
RBC, UA: NEGATIVE
Specific Gravity, UA: 1.016 (ref 1.005–1.030)
Urobilinogen, Ur: 0.2 mg/dL (ref 0.2–1.0)
pH, UA: 5 (ref 5.0–7.5)

## 2019-03-15 LAB — MICROSCOPIC EXAMINATION
Bacteria, UA: NONE SEEN
Casts: NONE SEEN /lpf

## 2019-03-15 LAB — PROTIME-INR
INR: 1 (ref 0.8–1.2)
Prothrombin Time: 13 seconds (ref 11.4–15.2)

## 2019-03-15 MED ORDER — SODIUM CHLORIDE 0.9 % IV SOLN: INTRAVENOUS | Status: DC

## 2019-03-15 MED ORDER — FENTANYL CITRATE (PF) 100 MCG/2ML IJ SOLN
INTRAMUSCULAR | Status: AC
  Filled 2019-03-15: qty 2

## 2019-03-15 MED ORDER — MIDAZOLAM HCL 5 MG/5ML IJ SOLN
INTRAMUSCULAR | Status: AC
  Filled 2019-03-15: qty 5

## 2019-03-15 MED ORDER — FENTANYL CITRATE (PF) 100 MCG/2ML IJ SOLN
INTRAMUSCULAR | Status: AC | PRN
  Administered 2019-03-15: 25 ug via INTRAVENOUS

## 2019-03-15 MED ORDER — MIDAZOLAM HCL 5 MG/5ML IJ SOLN
INTRAMUSCULAR | Status: AC | PRN
  Administered 2019-03-15: 1 mg via INTRAVENOUS

## 2019-03-15 NOTE — Progress Notes (Signed)
Portable chest x-ray done at bedside now.
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
## 2019-03-15 NOTE — Procedures (Signed)
Interventional Radiology Procedure Note  Procedure: CT Guided Biopsy of right lower lobe lung mass  Complications: None  Estimated Blood Loss: < 10 mL  Findings: 18 G core biopsy of RLL lung mass performed under CT guidance.  Two core samples obtained and sent to Pathology.  Venetia Night. Kathlene Cote, M.D Pager:  [phone]

## 2019-03-15 NOTE — Progress Notes (Signed)
Pt. Asymptomatic on room air: O2 sat. 97-99%. When pt. Talks, O2 sat drops to 85%the patient. States "I can't really tell; when I go to sleep at night, I sleep fine."

## 2019-03-15 NOTE — Progress Notes (Signed)
Attempted to wean O2 off pt.: unsuccessful. Pt. sats drop to 85-87% continuously on Room air. O2 placed back on 1L/[place]: O2 sat. Immediately up to 97%. Pt. Asymptomatic with low O2 sats. States "i've been trying to have home O2 for a while."

## 2019-03-16 DIAGNOSIS — D381 Neoplasm of uncertain behavior of trachea, bronchus and lung: Secondary | ICD-10-CM | POA: Insufficient documentation

## 2019-03-17 ENCOUNTER — Other Ambulatory Visit: Payer: Self-pay | Admitting: Pathology

## 2019-03-17 LAB — SURGICAL PATHOLOGY

## 2019-03-21 ENCOUNTER — Ambulatory Visit: Payer: Medicare Other | Admitting: Adult Health

## 2019-03-22 ENCOUNTER — Ambulatory Visit (INDEPENDENT_AMBULATORY_CARE_PROVIDER_SITE_OTHER): Payer: Medicare Other | Admitting: Internal Medicine

## 2019-03-22 ENCOUNTER — Encounter: Payer: Self-pay | Admitting: Internal Medicine

## 2019-03-22 VITALS — Ht 62.0 in | Wt 211.0 lb

## 2019-03-22 DIAGNOSIS — I10 Essential (primary) hypertension: Secondary | ICD-10-CM

## 2019-03-22 DIAGNOSIS — J449 Chronic obstructive pulmonary disease, unspecified: Secondary | ICD-10-CM

## 2019-03-22 DIAGNOSIS — Z9889 Other specified postprocedural states: Secondary | ICD-10-CM | POA: Diagnosis not present
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
## 2019-03-23 ENCOUNTER — Telehealth: Payer: Self-pay

## 2019-03-23 NOTE — Telephone Encounter (Signed)
Gave [employer] orders for Oxygen, after looking per pt request, unable to locate any companies that carry liquid O2, I was able to add a water bottle and also advised pt daughter to pick up a water based lubricant solution WITHOUT petroleum jelly to help with face mask. beth

## 2019-03-24 ENCOUNTER — Other Ambulatory Visit: Payer: Medicare Other

## 2019-03-24 NOTE — Progress Notes (Signed)
Tumor Board Documentation  Gail Nelson was presented by Janina Mayo, RN at our Tumor Board on 03/24/2019, which included representatives from medical oncology, radiation oncology, navigation, pathology, radiology, surgical, internal medicine, genetics, pulmonology, palliative care, research.  Gail Nelson currently presents as an external consult, for Gail Nelson, for new positive pathology with history of the following treatments: active survellience, surgical intervention(s).  Additionally, we reviewed previous medical and familial history, history of present illness, and recent lab results along with all available histopathologic and imaging studies. The tumor board considered available treatment options and made the following recommendations:   Refer to Medical Oncology  The following procedures/referrals were also placed: No orders of the defined types were placed in this encounter.   Clinical Trial Status: not discussed   Staging used: Pathologic Stage  AJCC Staging:       Group: Adenocarcinoma of Lung  National site-specific guidelines   were discussed with respect to the case.  Tumor board is a meeting of clinicians from various specialty areas who evaluate and discuss patients for whom a multidisciplinary approach is being considered. Final determinations in the plan of care are those of the provider(s). The responsibility for follow up of recommendations given during tumor board is that of the provider.   Today's extended care, comprehensive team conference, Gail Nelson was not present for the discussion and was not examined.   Multidisciplinary Tumor Board is a multidisciplinary case peer review process.  Decisions discussed in the Multidisciplinary Tumor Board reflect the opinions of the specialists present at the conference without having examined the patient.  Ultimately, treatment and diagnostic decisions rest with the primary provider(s) and the patient.

## 2019-03-25 DIAGNOSIS — J449 Chronic obstructive pulmonary disease, unspecified: Secondary | ICD-10-CM | POA: Diagnosis not present

## 2019-04-01 ENCOUNTER — Other Ambulatory Visit: Payer: Self-pay

## 2019-04-01 ENCOUNTER — Encounter: Payer: Self-pay | Admitting: Internal Medicine

## 2019-04-01 ENCOUNTER — Ambulatory Visit
Admission: RE | Admit: 2019-04-01 | Discharge: 2019-04-01 | Disposition: A | Discharge status: Home / Self Care | Payer: Medicare Other | Source: Ambulatory Visit | Attending: Radiation Oncology | Admitting: Radiation Oncology

## 2019-04-01 ENCOUNTER — Encounter: Payer: Self-pay | Admitting: *Deleted

## 2019-04-01 ENCOUNTER — Inpatient Hospital Stay: Payer: Medicare Other | Attending: Internal Medicine | Admitting: Internal Medicine

## 2019-04-01 DIAGNOSIS — Z993 Dependence on wheelchair: Secondary | ICD-10-CM

## 2019-04-01 DIAGNOSIS — Z9981 Dependence on supplemental oxygen: Secondary | ICD-10-CM

## 2019-04-01 DIAGNOSIS — I10 Essential (primary) hypertension: Secondary | ICD-10-CM | POA: Diagnosis not present

## 2019-04-01 DIAGNOSIS — Z87891 Personal history of nicotine dependence: Secondary | ICD-10-CM | POA: Diagnosis not present

## 2019-04-01 DIAGNOSIS — C771 Secondary and unspecified malignant neoplasm of intrathoracic lymph nodes: Secondary | ICD-10-CM | POA: Diagnosis not present

## 2019-04-01 DIAGNOSIS — Z853 Personal history of malignant neoplasm of breast: Secondary | ICD-10-CM

## 2019-04-01 DIAGNOSIS — C3431 Malignant neoplasm of lower lobe, right bronchus or lung: Secondary | ICD-10-CM | POA: Diagnosis not present

## 2019-04-01 DIAGNOSIS — Z79899 Other long term (current) drug therapy: Secondary | ICD-10-CM | POA: Insufficient documentation

## 2019-04-01 DIAGNOSIS — J449 Chronic obstructive pulmonary disease, unspecified: Secondary | ICD-10-CM | POA: Diagnosis not present

## 2019-04-01 DIAGNOSIS — Z9012 Acquired absence of left breast and nipple: Secondary | ICD-10-CM
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
## 2019-04-01 NOTE — Progress Notes (Signed)
  Oncology Nurse Navigator Documentation  Navigator Location: [facility]Med [facility] (04/01/19 1200) Referral Date to RadOnc/MedOnc: 03/29/19 (04/01/19 1200) )Navigator Encounter Type: Clinic/MDC (04/01/19 1200)   Abnormal Finding Date: 01/24/19 (04/01/19 1200) Confirmed Diagnosis Date: 03/17/19 (04/01/19 1200)         Multidisiplinary Clinic Date: 04/01/19 (04/01/19 1200) Multidisiplinary Clinic Type: Thoracic (04/01/19 1200)   Patient Visit Type: MedOnc;RadOnc (04/01/19 1200) Treatment Phase: Pre-Tx/Tx Discussion (04/01/19 1200) Barriers/Navigation Needs: Coordination of Care (04/01/19 1200)   Interventions: Coordination of Care (04/01/19 1200)   Coordination of Care: Appts (04/01/19 1200)        Acuity: Level 2-Minimal Needs (1-2 Barriers Identified) (04/01/19 1200)  met with patient and her daughter during initial consult with Dr. Rogue Bussing and Dr. Baruch Gouty. All questions answered during visit. Upcoming appts reviewed. Contact info given and instructed to call with any further questions or needs. Pt and her daughter verbalized understanding. Nothing further needed at this time.        Time Spent with Patient: 120 (04/01/19 1200)
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
## 2019-04-01 NOTE — Assessment & Plan Note (Addendum)
#  Locally advanced non-small cell lung cancer-stage II versus III [favor adeno].  We will check NGS.   #Patient not a candidate for surgery given her age; hilar lymph node involvement.  Discussed definitive chemo radiation-followed by immunotherapy which is standard for unresectable stage II/stage III lung cancer.  Given likely poor bone marrow reserve at her age-I am reluctant to offer concurrent chemotherapy along with radiation.  I am concerned about bone marrow suppression/risk of infections etc/poor tolerance to therapy.   # Discussed that in general chance of cure with radiation alone would be approximately 20%; with a high risk of relapse/need for subsequent therapy.  To patient's query-without any treatment-I would expect the life expectancy in the order of 4 to 6 months/based upon progression of disease.   #For above reasons -I would recommend proceeding with definitive radiation.  Await NGS.  Would consider immunotherapy maintenance/targeted therapy based upon NGS results.   # Thank you Dr. Humphrey Rolls for allowing me to participate in the care of your pleasant patient. Please do not hesitate to contact me with questions or concerns in the interim.  Discussed with Dr. Humphrey Rolls.  Discussed with [facility] nurse navigator.  # DISPOSITION:   #To be decided  Cc; Dr.Saadat Humphrey Rolls.  # I reviewed the blood work- with the patient in detail; also reviewed the imaging independently [as summarized above]; and with the patient in detail.

## 2019-04-05 ENCOUNTER — Encounter (INDEPENDENT_AMBULATORY_CARE_PROVIDER_SITE_OTHER): Payer: Self-pay

## 2019-04-05 ENCOUNTER — Ambulatory Visit (INDEPENDENT_AMBULATORY_CARE_PROVIDER_SITE_OTHER): Payer: Medicare Other

## 2019-04-05 ENCOUNTER — Other Ambulatory Visit: Payer: Self-pay

## 2019-04-05 ENCOUNTER — Ambulatory Visit (INDEPENDENT_AMBULATORY_CARE_PROVIDER_SITE_OTHER): Payer: Medicare Other | Admitting: Vascular Surgery

## 2019-04-05 DIAGNOSIS — I714 Abdominal aortic aneurysm, without rupture, unspecified: Secondary | ICD-10-CM

## 2019-04-06 ENCOUNTER — Encounter: Payer: Self-pay | Admitting: *Deleted

## 2019-04-06 ENCOUNTER — Ambulatory Visit: Payer: Medicare Other

## 2019-04-06 ENCOUNTER — Other Ambulatory Visit: Payer: Self-pay

## 2019-04-06 ENCOUNTER — Other Ambulatory Visit: Payer: Self-pay | Admitting: *Deleted

## 2019-04-06 DIAGNOSIS — Z510 Encounter for antineoplastic radiation therapy: Secondary | ICD-10-CM | POA: Diagnosis not present

## 2019-04-06 DIAGNOSIS — C3431 Malignant neoplasm of lower lobe, right bronchus or lung: Secondary | ICD-10-CM

## 2019-04-06 NOTE — Progress Notes (Signed)
  Oncology Nurse Navigator Documentation  Navigator Location: [facility] (04/06/19 1300)   )Navigator Encounter Type: Lobby (04/06/19 1300)                     Patient Visit Type: QD[facility] (04/06/19 1300) Treatment Phase: CT SIM (04/06/19 1300) Barriers/Navigation Needs: Education;Coordination of Care (04/06/19 1300) Education: Newly Diagnosed Cancer Education;Understanding Cancer/ Treatment Options (04/06/19 1300) Interventions: Education (04/06/19 1300)   Coordination of Care: Appts (04/06/19 1300) Education Method: Written (04/06/19 1300)         met with patient and her daughter in the lobby prior to [place] today. All questions answered during visit. Pt given resources regarding diagnosis and supportive services available. Instructed to call if has any further questions or needs. Pt and daughter verbalized understanding. Will follow up a next clinic visit.        Time Spent with Patient: 30 (04/06/19 1300)

## 2019-04-07 ENCOUNTER — Telehealth: Payer: Self-pay

## 2019-04-07 NOTE — Telephone Encounter (Signed)
Confirmed appointment on 04/11/2019 and screened for covid. klh °

## 2019-04-11 ENCOUNTER — Ambulatory Visit: Payer: Medicare Other | Admitting: Internal Medicine

## 2019-04-11 ENCOUNTER — Encounter: Payer: Self-pay | Admitting: Internal Medicine

## 2019-04-11 ENCOUNTER — Other Ambulatory Visit: Payer: Self-pay

## 2019-04-11 VITALS — BP 149/66 | HR 67 | Temp 97.6°F | Resp 16 | Ht 62.0 in | Wt 211.0 lb

## 2019-04-11 DIAGNOSIS — J449 Chronic obstructive pulmonary disease, unspecified: Secondary | ICD-10-CM

## 2019-04-11 DIAGNOSIS — R0602 Shortness of breath: Secondary | ICD-10-CM

## 2019-04-11 DIAGNOSIS — C3410 Malignant neoplasm of upper lobe, unspecified bronchus or lung: Secondary | ICD-10-CM | POA: Diagnosis not present
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
## 2019-04-11 NOTE — Patient Instructions (Signed)
Pulmonary Nodule A pulmonary nodule is tissue that has grown on your lung. A nodule may be cancer, but most nodules are not cancer. Follow these instructions at home:   Take over-the-counter and prescription medicines only as told by your doctor.  Do not use any products that have nicotine or tobacco, such as cigarettes and e-cigarettes. If you need help quitting, ask your doctor.  Keep all follow-up visits as told by your doctor. This is important. Contact a doctor if:  You have trouble breathing when doing activities.  You feel sick.  You feel more tired than normal.  You do not feel like eating.  You lose weight without trying.  You have chills.  You have night sweats. Get help right away if:  You cannot catch your breath.  You start making whistling sounds when breathing (wheezing).  You cannot stop coughing.  You cough up blood.  You get dizzy.  You feel like you are going to pass out (faint).  You have sudden chest pain.  You have a fever or symptoms for more than 2-3 days.  You have a fever and your symptoms suddenly get worse. Summary  A pulmonary nodule is tissue that has grown on your lung.  Most nodules are not cancer.  Your doctor will do tests to know what kind of nodule you have, and whether you need treatment for it. This information is not intended to replace advice given to you by your health care provider. Make sure you discuss any questions you have with your health care provider. Document Revised: 02/13/2017 Document Reviewed: 02/19/2016 Elsevier Patient Education  [place].

## 2019-04-13 DIAGNOSIS — C3431 Malignant neoplasm of lower lobe, right bronchus or lung: Secondary | ICD-10-CM | POA: Diagnosis not present

## 2019-04-13 DIAGNOSIS — Z510 Encounter for antineoplastic radiation therapy: Secondary | ICD-10-CM | POA: Diagnosis not present

## 2019-04-14 ENCOUNTER — Ambulatory Visit
Admission: RE | Admit: 2019-04-14 | Discharge: 2019-04-14 | Disposition: A | Discharge status: Home / Self Care | Payer: Medicare Other | Source: Ambulatory Visit | Attending: Radiation Oncology | Admitting: Radiation Oncology

## 2019-04-14 ENCOUNTER — Ambulatory Visit: Payer: Medicare Other

## 2019-04-14 ENCOUNTER — Encounter: Payer: Self-pay | Admitting: *Deleted

## 2019-04-14 DIAGNOSIS — C3431 Malignant neoplasm of lower lobe, right bronchus or lung: Secondary | ICD-10-CM | POA: Diagnosis not present

## 2019-04-14 DIAGNOSIS — C3490 Malignant neoplasm of unspecified part of unspecified bronchus or lung: Secondary | ICD-10-CM

## 2019-04-14 DIAGNOSIS — Z510 Encounter for antineoplastic radiation therapy: Secondary | ICD-10-CM | POA: Diagnosis not present

## 2019-04-14 NOTE — Addendum Note (Signed)
Addended by: Telford Nab on: 04/14/2019 02:36 PM   Modules accepted: Orders

## 2019-04-14 NOTE — Progress Notes (Signed)
  Oncology Nurse Navigator Documentation  Navigator Location: [facility] Onc (04/14/19 1100)   )Navigator Encounter Type: Treatment (04/14/19 1100)                   Treatment Initiated Date: 04/14/19 (04/14/19 1100) Patient Visit Type: GXIVHS (04/14/19 1100) Treatment Phase: First Radiation Tx (04/14/19 1100) Barriers/Navigation Needs: No Barriers At This Time (04/14/19 1100)   Interventions: None Required (04/14/19 1100)           met with patient after completing first radiation treatment. No barriers identified at this time. Pt states is doing well. Instructed to call if has any further questions or needs. Will follow up again next week. Nothing further needed at this time.           Time Spent with Patient: 15 (04/14/19 1100)

## 2019-04-15 ENCOUNTER — Ambulatory Visit
Admission: RE | Admit: 2019-04-15 | Discharge: 2019-04-15 | Disposition: A | Discharge status: Home / Self Care | Payer: Medicare Other | Source: Ambulatory Visit | Attending: Radiation Oncology | Admitting: Radiation Oncology

## 2019-04-15 DIAGNOSIS — C3431 Malignant neoplasm of lower lobe, right bronchus or lung: Secondary | ICD-10-CM | POA: Diagnosis not present

## 2019-04-15 DIAGNOSIS — Z510 Encounter for antineoplastic radiation therapy: Secondary | ICD-10-CM | POA: Diagnosis not present

## 2019-04-18 ENCOUNTER — Ambulatory Visit
Admission: RE | Admit: 2019-04-18 | Discharge: 2019-04-18 | Disposition: A | Discharge status: Home / Self Care | Payer: Medicare Other | Source: Ambulatory Visit | Attending: Radiation Oncology | Admitting: Radiation Oncology

## 2019-04-18 DIAGNOSIS — C3431 Malignant neoplasm of lower lobe, right bronchus or lung: Secondary | ICD-10-CM | POA: Diagnosis not present

## 2019-04-18 DIAGNOSIS — Z510 Encounter for antineoplastic radiation therapy: Secondary | ICD-10-CM | POA: Diagnosis not present

## 2019-04-19 ENCOUNTER — Ambulatory Visit
Admission: RE | Admit: 2019-04-19 | Discharge: 2019-04-19 | Disposition: A | Discharge status: Home / Self Care | Payer: Medicare Other | Source: Ambulatory Visit | Attending: Radiation Oncology | Admitting: Radiation Oncology

## 2019-04-19 DIAGNOSIS — C3431 Malignant neoplasm of lower lobe, right bronchus or lung: Secondary | ICD-10-CM | POA: Diagnosis not present

## 2019-04-19 DIAGNOSIS — Z510 Encounter for antineoplastic radiation therapy: Secondary | ICD-10-CM | POA: Diagnosis not present

## 2019-04-20 ENCOUNTER — Inpatient Hospital Stay: Payer: Medicare Other | Attending: Radiation Oncology

## 2019-04-20 ENCOUNTER — Ambulatory Visit
Admission: RE | Admit: 2019-04-20 | Discharge: 2019-04-20 | Disposition: A | Discharge status: Home / Self Care | Payer: Medicare Other | Source: Ambulatory Visit | Attending: Radiation Oncology | Admitting: Radiation Oncology

## 2019-04-20 DIAGNOSIS — C3431 Malignant neoplasm of lower lobe, right bronchus or lung: Secondary | ICD-10-CM | POA: Diagnosis not present

## 2019-04-20 DIAGNOSIS — Z510 Encounter for antineoplastic radiation therapy: Secondary | ICD-10-CM | POA: Diagnosis not present

## 2019-04-20 LAB — CBC
HCT: 44.4 % (ref 36.0–46.0)
Hemoglobin: 14 g/dL (ref 12.0–15.0)
MCH: 28.4 pg (ref 26.0–34.0)
MCHC: 31.5 g/dL (ref 30.0–36.0)
MCV: 90.1 fL (ref 80.0–100.0)
Platelets: 174 10*3/uL (ref 150–400)
RBC: 4.93 MIL/uL (ref 3.87–5.11)
RDW: 14.2 % (ref 11.5–15.5)
WBC: 8.2 10*3/uL (ref 4.0–10.5)
nRBC: 0 % (ref 0.0–0.2)

## 2019-04-21 ENCOUNTER — Ambulatory Visit
Admission: RE | Admit: 2019-04-21 | Discharge: 2019-04-21 | Disposition: A | Discharge status: Home / Self Care | Payer: Medicare Other | Source: Ambulatory Visit | Attending: Radiation Oncology | Admitting: Radiation Oncology

## 2019-04-21 ENCOUNTER — Encounter: Payer: Self-pay | Admitting: Internal Medicine

## 2019-04-21 DIAGNOSIS — Z510 Encounter for antineoplastic radiation therapy: Secondary | ICD-10-CM | POA: Diagnosis not present

## 2019-04-21 DIAGNOSIS — C3431 Malignant neoplasm of lower lobe, right bronchus or lung: Secondary | ICD-10-CM | POA: Diagnosis not present

## 2019-04-22 ENCOUNTER — Ambulatory Visit
Admission: RE | Admit: 2019-04-22 | Discharge: 2019-04-22 | Disposition: A | Discharge status: Home / Self Care | Payer: Medicare Other | Source: Ambulatory Visit | Attending: Radiation Oncology | Admitting: Radiation Oncology

## 2019-04-22 DIAGNOSIS — J449 Chronic obstructive pulmonary disease, unspecified: Secondary | ICD-10-CM | POA: Diagnosis not present

## 2019-04-22 DIAGNOSIS — C3431 Malignant neoplasm of lower lobe, right bronchus or lung: Secondary | ICD-10-CM | POA: Diagnosis not present

## 2019-04-22 DIAGNOSIS — Z510 Encounter for antineoplastic radiation therapy: Secondary | ICD-10-CM | POA: Diagnosis not present

## 2019-04-25 ENCOUNTER — Ambulatory Visit
Admission: RE | Admit: 2019-04-25 | Discharge: 2019-04-25 | Disposition: A | Discharge status: Home / Self Care | Payer: Medicare Other | Source: Ambulatory Visit | Attending: Radiation Oncology | Admitting: Radiation Oncology

## 2019-04-25 DIAGNOSIS — Z510 Encounter for antineoplastic radiation therapy: Secondary | ICD-10-CM | POA: Diagnosis not present

## 2019-04-25 DIAGNOSIS — C3431 Malignant neoplasm of lower lobe, right bronchus or lung: Secondary | ICD-10-CM | POA: Diagnosis not present

## 2019-04-26 ENCOUNTER — Ambulatory Visit
Admission: RE | Admit: 2019-04-26 | Discharge: 2019-04-26 | Disposition: A | Discharge status: Home / Self Care | Payer: Medicare Other | Source: Ambulatory Visit | Attending: Radiation Oncology | Admitting: Radiation Oncology

## 2019-04-26 DIAGNOSIS — C3431 Malignant neoplasm of lower lobe, right bronchus or lung: Secondary | ICD-10-CM | POA: Diagnosis not present

## 2019-04-26 DIAGNOSIS — Z510 Encounter for antineoplastic radiation therapy: Secondary | ICD-10-CM | POA: Diagnosis not present

## 2019-04-27 ENCOUNTER — Encounter: Payer: Self-pay | Admitting: *Deleted

## 2019-04-27 ENCOUNTER — Ambulatory Visit
Admission: RE | Admit: 2019-04-27 | Discharge: 2019-04-27 | Disposition: A | Discharge status: Home / Self Care | Payer: Medicare Other | Source: Ambulatory Visit | Attending: Radiation Oncology | Admitting: Radiation Oncology

## 2019-04-27 DIAGNOSIS — Z510 Encounter for antineoplastic radiation therapy: Secondary | ICD-10-CM | POA: Diagnosis not present

## 2019-04-27 DIAGNOSIS — C3431 Malignant neoplasm of lower lobe, right bronchus or lung: Secondary | ICD-10-CM | POA: Diagnosis not present

## 2019-04-28 ENCOUNTER — Ambulatory Visit: Payer: Medicare Other

## 2019-04-29 ENCOUNTER — Ambulatory Visit: Payer: Medicare Other

## 2019-05-02 ENCOUNTER — Other Ambulatory Visit: Payer: Self-pay

## 2019-05-02 DIAGNOSIS — R05 Cough: Secondary | ICD-10-CM

## 2019-05-02 DIAGNOSIS — R059 Cough, unspecified: Secondary | ICD-10-CM

## 2019-05-02 MED ORDER — HYDROCOD POLST-CPM POLST ER 10-8 MG/5ML PO SUER: 5.0000 mL | Freq: Two times a day (BID) | ORAL | 0 refills | Status: DC | PRN

## 2019-05-02 NOTE — Telephone Encounter (Signed)
Pt notified we send med

## 2019-05-04 ENCOUNTER — Other Ambulatory Visit: Payer: Self-pay

## 2019-05-05 ENCOUNTER — Inpatient Hospital Stay: Payer: Medicare Other | Admitting: Internal Medicine

## 2019-05-05 ENCOUNTER — Inpatient Hospital Stay: Payer: Medicare Other | Attending: Internal Medicine

## 2019-05-05 ENCOUNTER — Encounter: Payer: Self-pay | Admitting: Internal Medicine

## 2019-05-05 DIAGNOSIS — Z79899 Other long term (current) drug therapy: Secondary | ICD-10-CM | POA: Insufficient documentation

## 2019-05-05 DIAGNOSIS — Z9012 Acquired absence of left breast and nipple: Secondary | ICD-10-CM | POA: Insufficient documentation

## 2019-05-05 DIAGNOSIS — M545 Low back pain: Secondary | ICD-10-CM | POA: Insufficient documentation

## 2019-05-05 DIAGNOSIS — C3431 Malignant neoplasm of lower lobe, right bronchus or lung: Secondary | ICD-10-CM | POA: Insufficient documentation

## 2019-05-05 DIAGNOSIS — I119 Hypertensive heart disease without heart failure: Secondary | ICD-10-CM | POA: Diagnosis not present

## 2019-05-05 DIAGNOSIS — Z87891 Personal history of nicotine dependence: Secondary | ICD-10-CM | POA: Insufficient documentation

## 2019-05-05 DIAGNOSIS — Z923 Personal history of irradiation: Secondary | ICD-10-CM | POA: Insufficient documentation

## 2019-05-05 DIAGNOSIS — J449 Chronic obstructive pulmonary disease, unspecified: Secondary | ICD-10-CM | POA: Insufficient documentation

## 2019-05-05 DIAGNOSIS — J90 Pleural effusion, not elsewhere classified: Secondary | ICD-10-CM | POA: Diagnosis not present

## 2019-05-05 DIAGNOSIS — Z853 Personal history of malignant neoplasm of breast: Secondary | ICD-10-CM | POA: Diagnosis not present

## 2019-05-05 DIAGNOSIS — C3490 Malignant neoplasm of unspecified part of unspecified bronchus or lung: Secondary | ICD-10-CM

## 2019-05-05 DIAGNOSIS — F419 Anxiety disorder, unspecified: Secondary | ICD-10-CM | POA: Insufficient documentation

## 2019-05-05 LAB — CBC WITH DIFFERENTIAL/PLATELET
Abs Immature Granulocytes: 0.05 10*3/uL (ref 0.00–0.07)
Basophils Absolute: 0 10*3/uL (ref 0.0–0.1)
Basophils Relative: 0 %
Eosinophils Absolute: 0.2 10*3/uL (ref 0.0–0.5)
Eosinophils Relative: 3 %
HCT: 45.8 % (ref 36.0–46.0)
Hemoglobin: 14.5 g/dL (ref 12.0–15.0)
Immature Granulocytes: 1 %
Lymphocytes Relative: 6 %
Lymphs Abs: 0.5 10*3/uL — ABNORMAL LOW (ref 0.7–4.0)
MCH: 28.5 pg (ref 26.0–34.0)
MCHC: 31.7 g/dL (ref 30.0–36.0)
MCV: 90.2 fL (ref 80.0–100.0)
Monocytes Absolute: 0.8 10*3/uL (ref 0.1–1.0)
Monocytes Relative: 10 %
Neutro Abs: 6.2 10*3/uL (ref 1.7–7.7)
Neutrophils Relative %: 80 %
Platelets: 161 10*3/uL (ref 150–400)
RBC: 5.08 MIL/uL (ref 3.87–5.11)
RDW: 14.3 % (ref 11.5–15.5)
WBC: 7.7 10*3/uL (ref 4.0–10.5)
nRBC: 0 % (ref 0.0–0.2)

## 2019-05-05 LAB — COMPREHENSIVE METABOLIC PANEL
ALT: 6 U/L (ref 0–44)
AST: 16 U/L (ref 15–41)
Albumin: 3.6 g/dL (ref 3.5–5.0)
Alkaline Phosphatase: 58 U/L (ref 38–126)
Anion gap: 10 (ref 5–15)
BUN: 14 mg/dL (ref 8–23)
CO2: 30 mmol/L (ref 22–32)
Calcium: 9.1 mg/dL (ref 8.9–10.3)
Chloride: 100 mmol/L (ref 98–111)
Creatinine, Ser: 0.86 mg/dL (ref 0.44–1.00)
GFR calc Af Amer: >60 mL/min (ref >60)
GFR calc non Af Amer: 58 mL/min — ABNORMAL LOW (ref >60)
Glucose, Bld: 120 mg/dL — ABNORMAL HIGH (ref 70–99)
Potassium: 3.9 mmol/L (ref 3.5–5.1)
Sodium: 140 mmol/L (ref 135–145)
Total Bilirubin: 0.8 mg/dL (ref 0.3–1.2)
Total Protein: 7 g/dL (ref 6.5–8.1)
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
## 2019-05-05 NOTE — Assessment & Plan Note (Addendum)
#  Locally advanced non-small cell lung cancer-stage II versus III [favor adeno]. S/p definitive RT [finished March 24].  #Patient tolerated radiation fairly well except for mild fatigue which she seems to be improving.  Would recommend imaging end of May 2021.  Discussed that further treatment options-we will based upon results of the CT scan.  # COPD-stable.  Recommend continue inhalers.  #Driving: Discussed with patient that I would recommend not driving given her age/recent treatments etc.  Patient is quite upset.  Also recommend vaccinations-patient reluctant  # DISPOSITION: # follow up last week of May 2021- MD; labs- cbc/cmp;CT chest prior- Dr.B  Cc; Dr.Saadat Humphrey Rolls.

## 2019-05-23 DIAGNOSIS — J449 Chronic obstructive pulmonary disease, unspecified: Secondary | ICD-10-CM | POA: Diagnosis not present

## 2019-05-24 DIAGNOSIS — H903 Sensorineural hearing loss, bilateral: Secondary | ICD-10-CM | POA: Diagnosis not present

## 2019-05-25 ENCOUNTER — Other Ambulatory Visit: Payer: Self-pay

## 2019-05-25 ENCOUNTER — Encounter: Payer: Self-pay | Admitting: Radiation Oncology

## 2019-05-25 ENCOUNTER — Ambulatory Visit
Admission: RE | Admit: 2019-05-25 | Discharge: 2019-05-25 | Disposition: A | Discharge status: Home / Self Care | Payer: Medicare Other | Source: Ambulatory Visit | Attending: Radiation Oncology | Admitting: Radiation Oncology

## 2019-05-25 ENCOUNTER — Other Ambulatory Visit: Payer: Self-pay | Admitting: *Deleted

## 2019-05-25 VITALS — BP 148/76 | HR 84 | Temp 95.6°F | Resp 20 | Wt 202.8 lb

## 2019-05-25 DIAGNOSIS — C3431 Malignant neoplasm of lower lobe, right bronchus or lung: Secondary | ICD-10-CM

## 2019-05-25 DIAGNOSIS — Z923 Personal history of irradiation: Secondary | ICD-10-CM | POA: Insufficient documentation

## 2019-05-25 DIAGNOSIS — Z87891 Personal history of nicotine dependence: Secondary | ICD-10-CM | POA: Diagnosis not present

## 2019-05-25 NOTE — Progress Notes (Signed)
Radiation Oncology Follow up Note  Name: Gail Nelson   Date:   05/25/2019 MRN:  [id number] DOB: [birth year]    This 86 y.o. female presents to the clinic today for 1 month follow-up status post hypofractionated course of radiation therapy to her life right lung for non-small cell lung cancer favoring adenocarcinoma.  REFERRING PROVIDER: Lavera Guise, MD  HPI: Patient is now seen at 1 month having completed hypofractionated course of radiation therapy to her light right lung for non-small cell lung cancer favoring adenocarcinoma.  She is seen today 1 month out she is complaining of increasing abdominal distention generalized pain and lethargy.  Her pulmonary symptoms are minimal she specifically denies cough hemoptysis or chest tightness.  She does have a history of a 6 cm abdominal aortic aneurysm which concerns me..  COMPLICATIONS OF TREATMENT: none  FOLLOW UP COMPLIANCE: keeps appointments   PHYSICAL EXAM:  BP (!) 148/76 (BP Location: Left Arm, Patient Position: Sitting, Cuff Size: Large)   Pulse 84   Temp (!) 95.6 F (35.3 C) (Tympanic)   Resp 20   Wt 202 lb 12.8 oz (92 kg)   BMI 37.09 kg/m  Well-developed obese female wheelchair-bound in in moderate pain.  Well-developed well-nourished patient in NAD. HEENT reveals PERLA, EOMI, discs not visualized.  Oral cavity is clear. No oral mucosal lesions are identified. Neck is clear without evidence of cervical or supraclavicular adenopathy. Lungs are clear to A&P. Cardiac examination is essentially unremarkable with regular rate and rhythm without murmur rub or thrill. Abdomen is benign with no organomegaly or masses noted. Motor sensory and DTR levels are equal and symmetric in the upper and lower extremities. Cranial nerves II through XII are grossly intact. Proprioception is intact. No peripheral adenopathy or edema is identified. No motor or sensory levels are noted. Crude visual fields are within normal range.  RADIOLOGY RESULTS:  CT scan of abdomen chest and pelvis ordered  PLAN: This time I can rule out possible aortic aneurysm progression.  I have ordered CT scan of abdomen chest and pelvis and will see her along with Dr. B after completion of the scans.  We will also arrange for Josh to see her in symptom management.  Not sure of the etiology of her symptoms although this is definitely unrelated to radiation since I treated a markedly small field which was tight and constricted to a small peripheral lesion as well as her hilum.  Patient understands my recommendations well.  We will follow up accordingly.  I would like to take this opportunity to thank you for allowing me to participate in the care of your patient.Noreene Filbert, MD

## 2019-06-01 ENCOUNTER — Other Ambulatory Visit: Payer: Self-pay | Admitting: Radiation Oncology

## 2019-06-01 ENCOUNTER — Telehealth: Payer: Self-pay | Admitting: *Deleted

## 2019-06-01 ENCOUNTER — Ambulatory Visit
Admission: RE | Admit: 2019-06-01 | Discharge: 2019-06-01 | Disposition: A | Discharge status: Home / Self Care | Payer: Medicare Other | Source: Ambulatory Visit | Attending: Internal Medicine | Admitting: Internal Medicine

## 2019-06-01 ENCOUNTER — Other Ambulatory Visit: Payer: Self-pay

## 2019-06-01 DIAGNOSIS — C3431 Malignant neoplasm of lower lobe, right bronchus or lung: Secondary | ICD-10-CM | POA: Insufficient documentation

## 2019-06-01 DIAGNOSIS — J439 Emphysema, unspecified: Secondary | ICD-10-CM | POA: Diagnosis not present

## 2019-06-01 DIAGNOSIS — J90 Pleural effusion, not elsewhere classified: Secondary | ICD-10-CM | POA: Diagnosis not present

## 2019-06-01 DIAGNOSIS — K5730 Diverticulosis of large intestine without perforation or abscess without bleeding: Secondary | ICD-10-CM | POA: Diagnosis not present

## 2019-06-01 MED ORDER — IOHEXOL 300 MG/ML  SOLN
100.0000 mL | Freq: Once | INTRAMUSCULAR | Status: AC | PRN
  Administered 2019-06-01: 15:00:00 100 mL via INTRAVENOUS

## 2019-06-01 NOTE — Telephone Encounter (Signed)
Incoming call report from [facility] radiology. Spoke with Diane. She asked that Dr. Rogue Bussing review the impression regarding the patient's endosac.  Dr. Rogue Bussing aware and reached out to Dr. Lucky Cowboy in vascular surgery dept.     IMPRESSION: 1. Spiculated mass along the posterior RIGHT lower lobe is obscured by volume loss and pleural fluid. 2. No adenopathy or metastatic disease. 3. Moderate size RIGHT pleural effusion. 4. Endosac following aorto bi-iliac endograft placement greater than 6 cm (6.5 x 6.5 cm) with suspicion for type 2 endoleak. Vascular surgery assessment may be warranted, CT angiography could be performed as clinically indicated. 5. Cardiomegaly with signs of mitral annular calcification. 6. Dilated central pulmonary arteries, can be seen in the setting of pulmonary arterial hypertension. 7. Colonic diverticulosis without evidence of diverticulitis. 8. Scarring about the LEFT axilla following LEFT mastectomy and axillary dissection. 9. Emphysema and aortic atherosclerosis.

## 2019-06-02 ENCOUNTER — Inpatient Hospital Stay (HOSPITAL_BASED_OUTPATIENT_CLINIC_OR_DEPARTMENT_OTHER): Payer: Medicare Other | Admitting: Internal Medicine

## 2019-06-02 ENCOUNTER — Inpatient Hospital Stay: Payer: Medicare Other

## 2019-06-02 ENCOUNTER — Ambulatory Visit
Admission: RE | Admit: 2019-06-02 | Discharge: 2019-06-02 | Disposition: A | Discharge status: Home / Self Care | Payer: Medicare Other | Source: Ambulatory Visit | Attending: Radiation Oncology | Admitting: Radiation Oncology

## 2019-06-02 ENCOUNTER — Other Ambulatory Visit
Admission: RE | Admit: 2019-06-02 | Discharge: 2019-06-02 | Disposition: A | Discharge status: Home / Self Care | Payer: Medicare Other | Source: Ambulatory Visit | Attending: Cardiothoracic Surgery | Admitting: Cardiothoracic Surgery

## 2019-06-02 ENCOUNTER — Inpatient Hospital Stay (HOSPITAL_BASED_OUTPATIENT_CLINIC_OR_DEPARTMENT_OTHER): Payer: Medicare Other | Admitting: Hospice and Palliative Medicine

## 2019-06-02 VITALS — BP 156/96 | HR 87 | Temp 95.2°F | Wt 198.2 lb

## 2019-06-02 DIAGNOSIS — Z01812 Encounter for preprocedural laboratory examination: Secondary | ICD-10-CM | POA: Diagnosis not present

## 2019-06-02 DIAGNOSIS — Z7189 Other specified counseling: Secondary | ICD-10-CM

## 2019-06-02 DIAGNOSIS — Z515 Encounter for palliative care: Secondary | ICD-10-CM

## 2019-06-02 DIAGNOSIS — G893 Neoplasm related pain (acute) (chronic): Secondary | ICD-10-CM

## 2019-06-02 DIAGNOSIS — Z79899 Other long term (current) drug therapy: Secondary | ICD-10-CM | POA: Diagnosis not present

## 2019-06-02 DIAGNOSIS — J90 Pleural effusion, not elsewhere classified: Secondary | ICD-10-CM

## 2019-06-02 DIAGNOSIS — C3431 Malignant neoplasm of lower lobe, right bronchus or lung: Secondary | ICD-10-CM

## 2019-06-02 DIAGNOSIS — Z20822 Contact with and (suspected) exposure to covid-19: Secondary | ICD-10-CM | POA: Insufficient documentation

## 2019-06-02 DIAGNOSIS — M545 Low back pain: Secondary | ICD-10-CM | POA: Diagnosis not present

## 2019-06-02 DIAGNOSIS — Z853 Personal history of malignant neoplasm of breast: Secondary | ICD-10-CM | POA: Diagnosis not present

## 2019-06-02 DIAGNOSIS — I119 Hypertensive heart disease without heart failure: Secondary | ICD-10-CM | POA: Diagnosis not present

## 2019-06-02 DIAGNOSIS — Z87891 Personal history of nicotine dependence: Secondary | ICD-10-CM | POA: Diagnosis not present

## 2019-06-02 DIAGNOSIS — Z923 Personal history of irradiation: Secondary | ICD-10-CM | POA: Diagnosis not present

## 2019-06-02 DIAGNOSIS — J449 Chronic obstructive pulmonary disease, unspecified: Secondary | ICD-10-CM | POA: Diagnosis not present

## 2019-06-02 DIAGNOSIS — Z9012 Acquired absence of left breast and nipple: Secondary | ICD-10-CM | POA: Diagnosis not present

## 2019-06-02 LAB — COMPREHENSIVE METABOLIC PANEL
ALT: 8 U/L (ref 0–44)
AST: 17 U/L (ref 15–41)
Albumin: 3.7 g/dL (ref 3.5–5.0)
Alkaline Phosphatase: 53 U/L (ref 38–126)
Anion gap: 10 (ref 5–15)
BUN: 11 mg/dL (ref 8–23)
CO2: 30 mmol/L (ref 22–32)
Calcium: 9 mg/dL (ref 8.9–10.3)
Chloride: 100 mmol/L (ref 98–111)
Creatinine, Ser: 0.78 mg/dL (ref 0.44–1.00)
GFR calc Af Amer: >60 mL/min (ref >60)
GFR calc non Af Amer: >60 mL/min (ref >60)
Glucose, Bld: 124 mg/dL — ABNORMAL HIGH (ref 70–99)
Potassium: 3.7 mmol/L (ref 3.5–5.1)
Sodium: 140 mmol/L (ref 135–145)
Total Bilirubin: 0.7 mg/dL (ref 0.3–1.2)
Total Protein: 6.7 g/dL (ref 6.5–8.1)

## 2019-06-02 LAB — CBC WITH DIFFERENTIAL/PLATELET
Abs Immature Granulocytes: 0.03 10*3/uL (ref 0.00–0.07)
Basophils Absolute: 0 10*3/uL (ref 0.0–0.1)
Basophils Relative: 0 %
Eosinophils Absolute: 0.1 10*3/uL (ref 0.0–0.5)
Eosinophils Relative: 2 %
HCT: 44.4 % (ref 36.0–46.0)
Hemoglobin: 14 g/dL (ref 12.0–15.0)
Immature Granulocytes: 1 %
Lymphocytes Relative: 11 %
Lymphs Abs: 0.6 10*3/uL — ABNORMAL LOW (ref 0.7–4.0)
MCH: 28.3 pg (ref 26.0–34.0)
MCHC: 31.5 g/dL (ref 30.0–36.0)
MCV: 89.7 fL (ref 80.0–100.0)
Monocytes Absolute: 0.5 10*3/uL (ref 0.1–1.0)
Monocytes Relative: 8 %
Neutro Abs: 4.7 10*3/uL (ref 1.7–7.7)
Neutrophils Relative %: 78 %
Platelets: 171 10*3/uL (ref 150–400)
RBC: 4.95 MIL/uL (ref 3.87–5.11)
RDW: 14.7 % (ref 11.5–15.5)
WBC: 6 10*3/uL (ref 4.0–10.5)
nRBC: 0 % (ref 0.0–0.2)

## 2019-06-02 LAB — SARS CORONAVIRUS 2 (TAT 6-24 HRS): SARS Coronavirus 2: NEGATIVE
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
## 2019-06-02 NOTE — Assessment & Plan Note (Addendum)
#  Locally advanced non-small cell lung cancer-stage II versus III [favor adeno]. S/p definitive RT [finished March 24]. CT April 28th- right sided pleural effusion/ leaking endovascular stent [see below]; otherwise no evidence of any distant metastatic disease.  #Recommend urgent thoracentesis-diagnostic/therapeutic.  Also recommend a PET scan for further evaluation of progression of disease.  Discussed with patient/daughter that she might be candidate for systemic therapy with Keytruda with positive for metastatic disease.  Patient daughter not seen on further discussion regarding systemic therapy at this time.  #Right pleural effusion-unclear if metastatic disease versus atelectasis s/p radiation.  Plan as above.  # COPD-stable.  Recommend continue inhalers.  # Pain- right sided upper quadrant/flank pain-unclear etiology.  Await PET scan.  Recommend tramadol/see below  #Endovascular stent leak-incidentally noted on CT scan.  Discussed with Dr. Lucky Cowboy; recommend evaluation for possible repair on nonemergent basis.   #Given poorly controlled pain/anxiety-also in the context of likely incurable disease recommend goals of care discussion.  Discussed with [employer].  # DISPOSITION: # PET scan ASAP # thoracenetsis  STAT # follow up lwith me 1-2 days after the PET scan- No labs- Dr.B  Cc; Dr.Saadat Khan/Dr.Dew.  # I reviewed the blood work- with the patient in detail; also reviewed the imaging independently [as summarized above]; and with the patient in detail.

## 2019-06-02 NOTE — Progress Notes (Signed)
Radiation Oncology Follow up Note  Name: Gail Nelson   Date:   06/02/2019 MRN:  [id number] DOB: 1923-06-05    This 86 y.o. female presents to the clinic today for follow-up of imaging studies and patient status post hypofractionated course of radiation therapy to right lung for non-small cell lung cancer favoring adenocarcinoma.Marland Kitchen  REFERRING PROVIDER: Lavera Guise, MD  HPI: Patient is a 86 year old female seen 1 month out from hypofractionated course of radiation therapy to her right lung for non-small cell lung cancer.  She was complaining of increased abdominal distention pain and overall lethargy.  She has a history of AAA I ordered imaging studies.  CT scan of chest abdomen pelvis confirmed spiculated mass in the posterior right lower lobe obscured by volume loss and pleural fluid.  She had a moderate to significant right-sided pleural effusion which has been scheduled for thoracentesis.  She also had a suspicion of type II endoleak of her 6 cm abdominal aortic aneurysm which is being referred to vascular surgery for evaluation.  She is seen today she is doing somewhat better she has been seen by Dr. B who has ordered a PET CT scan.  She is doing somewhat better today and is also seeing symptom management.  COMPLICATIONS OF TREATMENT: none  FOLLOW UP COMPLIANCE: keeps appointments   PHYSICAL EXAM:  There were no vitals taken for this visit. Wheelchair-bound female in NAD.  Well-developed well-nourished patient in NAD. HEENT reveals PERLA, EOMI, discs not visualized.  Oral cavity is clear. No oral mucosal lesions are identified. Neck is clear without evidence of cervical or supraclavicular adenopathy. Lungs are clear to A&P. Cardiac examination is essentially unremarkable with regular rate and rhythm without murmur rub or thrill. Abdomen is benign with no organomegaly or masses noted. Motor sensory and DTR levels are equal and symmetric in the upper and lower extremities. Cranial nerves II  through XII are grossly intact. Proprioception is intact. No peripheral adenopathy or edema is identified. No motor or sensory levels are noted. Crude visual fields are within normal range.  RADIOLOGY RESULTS: CT scans of chest abdomen pelvis reviewed compatible with above-stated findings   PLAN: Present time she will have a consultation with vascular surgery as well as thoracentesis for her right pleural effusion.  I also review her PET scan when it becomes available.  I set up a 1 month follow-up appointment.  She continues close follow-up care and treatment with medical oncology.  Patient knows to call with any concerns.  I would like to take this opportunity to thank you for allowing me to participate in the care of your patient.Noreene Filbert, MD
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
## 2019-06-03 ENCOUNTER — Other Ambulatory Visit: Payer: Self-pay

## 2019-06-03 ENCOUNTER — Ambulatory Visit
Admission: RE | Admit: 2019-06-03 | Discharge: 2019-06-03 | Disposition: A | Discharge status: Home / Self Care | Payer: Medicare Other | Source: Ambulatory Visit | Attending: Internal Medicine | Admitting: Internal Medicine

## 2019-06-03 ENCOUNTER — Other Ambulatory Visit: Payer: Self-pay | Admitting: Internal Medicine

## 2019-06-03 DIAGNOSIS — Z923 Personal history of irradiation: Secondary | ICD-10-CM | POA: Diagnosis not present

## 2019-06-03 DIAGNOSIS — J910 Malignant pleural effusion: Secondary | ICD-10-CM | POA: Insufficient documentation

## 2019-06-03 DIAGNOSIS — J90 Pleural effusion, not elsewhere classified: Secondary | ICD-10-CM

## 2019-06-03 DIAGNOSIS — C3431 Malignant neoplasm of lower lobe, right bronchus or lung: Secondary | ICD-10-CM | POA: Insufficient documentation

## 2019-06-03 DIAGNOSIS — J9811 Atelectasis: Secondary | ICD-10-CM | POA: Diagnosis not present

## 2019-06-03 LAB — PROTEIN, PLEURAL OR PERITONEAL FLUID: Total protein, fluid: 4.2 g/dL

## 2019-06-03 LAB — BODY FLUID CELL COUNT WITH DIFFERENTIAL
Eos, Fluid: 0 %
Lymphs, Fluid: 41 %
Monocyte-Macrophage-Serous Fluid: 27 %
Neutrophil Count, Fluid: 32 %
Total Nucleated Cell Count, Fluid: 572 cu mm

## 2019-06-03 LAB — LACTATE DEHYDROGENASE, PLEURAL OR PERITONEAL FLUID: LD, Fluid: 234 U/L — ABNORMAL HIGH (ref 3–23)

## 2019-06-03 LAB — GLUCOSE, PLEURAL OR PERITONEAL FLUID: Glucose, Fluid: 112 mg/dL

## 2019-06-03 NOTE — Procedures (Signed)
Ultrasound-guided diagnostic and therapeutic right sided thoracentesis performed yielding 900  mililiters of seriosanginous colored fluid. No immediate complications.   Diagnostic fluid was sent to the lab for further analysis. Follow-up chest x-ray pending. EBL is , 2 ml.    ]

## 2019-06-05 LAB — PH, BODY FLUID: pH, Body Fluid: 7.5

## 2019-06-06 LAB — BODY FLUID CULTURE: Culture: NO GROWTH

## 2019-06-07 ENCOUNTER — Other Ambulatory Visit: Payer: Self-pay

## 2019-06-07 ENCOUNTER — Ambulatory Visit (INDEPENDENT_AMBULATORY_CARE_PROVIDER_SITE_OTHER): Payer: Medicare Other | Admitting: Vascular Surgery

## 2019-06-07 VITALS — BP 153/79 | HR 116 | Resp 16 | Wt 198.2 lb

## 2019-06-07 DIAGNOSIS — I714 Abdominal aortic aneurysm, without rupture, unspecified: Secondary | ICD-10-CM

## 2019-06-07 DIAGNOSIS — I890 Lymphedema, not elsewhere classified: Secondary | ICD-10-CM

## 2019-06-07 DIAGNOSIS — D381 Neoplasm of uncertain behavior of trachea, bronchus and lung: Secondary | ICD-10-CM

## 2019-06-07 DIAGNOSIS — I10 Essential (primary) hypertension: Secondary | ICD-10-CM

## 2019-06-07 LAB — CYTOLOGY - NON PAP

## 2019-06-07 NOTE — Patient Instructions (Signed)
Abdominal Aortic Aneurysm  An aneurysm is a bulge in one of the blood vessels that carry blood away from the heart (artery). It happens when blood pushes up against a weak or damaged place in the wall of an artery. An abdominal aortic aneurysm happens in the main artery of the body (aorta). Some aneurysms may not cause problems. If it grows, it can burst or tear, causing bleeding inside the body. This is an emergency. It needs to be treated right away. What are the causes? The exact cause of this condition is not known. What increases the risk? The following may make you more likely to get this condition:  Being a female who is 84 years of age or older.  Being white (Caucasian).  Using tobacco.  Having a family history of aneurysms.  Having the following conditions: ? Hardening of the arteries (arteriosclerosis). ? Inflammation of the walls of an artery (arteritis). ? Certain genetic conditions. ? Being very overweight (obesity). ? An infection in the wall of the aorta (infectious aortitis). ? High cholesterol. ? High blood pressure (hypertension). What are the signs or symptoms? Symptoms depend on the size of the aneurysm and how fast it is growing. Most grow slowly and do not cause any symptoms. If symptoms do occur, they may include:  Pain in the belly (abdomen), side, or back.  Feeling full after eating only small amounts of food.  Feeling a throbbing lump in the belly. Symptoms that the aneurysm has burst (ruptured) include:  Sudden, very bad pain in the belly, side, or back.  Feeling sick to your stomach (nauseous).  Throwing up (vomiting).  Feeling light-headed or passing out. How is this treated? Treatment for this condition depends on:  The size of the aneurysm.  How fast it is growing.  Your age.  Your risk of having it burst. If your aneurysm is smaller than 2 inches (5 cm), your doctor may manage it by:  Checking it often to see if it is getting bigger.  You may have an imaging test (ultrasound) to check it every 3-6 months, every year, or every few years.  Giving you medicines to: ? Control blood pressure. ? Treat pain. ? Fight infection. If your aneurysm is larger than 2 inches (5 cm), you may need surgery to fix it. Follow these instructions at home: Lifestyle  Do not use any products that have nicotine or tobacco in them. This includes cigarettes, e-cigarettes, and chewing tobacco. If you need help quitting, ask your doctor.  Get regular exercise. Ask your doctor what types of exercise are best for you. Eating and drinking  Eat a heart-healthy diet. This includes eating plenty of: ? Fresh fruits and vegetables. ? Whole grains. ? Low-fat (lean) protein. ? Low-fat dairy products.  Avoid foods that are high in saturated fat and cholesterol. These foods include red meat and some dairy products.  Do not drink alcohol if: ? Your doctor tells you not to drink. ? You are pregnant, may be pregnant, or are planning to become pregnant.  If you drink alcohol: ? Limit how much you use to:  0-1 drink a day for women.  0-2 drinks a day for men. ? Be aware of how much alcohol is in your drink. In the [place] one drink equals any of these:  One typical bottle of beer (12 oz).  One-half glass of wine (5 oz).  One shot of hard liquor (1 oz). General instructions  Take over-the-counter and prescription medicines only as  told by your doctor.  Keep your blood pressure within normal limits. Ask your doctor what your blood pressure should be.  Have your blood sugar (glucose) level and cholesterol levels checked regularly. Keep your blood sugar level and cholesterol levels within normal limits.  Avoid heavy lifting and activities that take a lot of effort. Ask your doctor what activities are safe for you.  Keep all follow-up visits as told by your doctor. This is important. ? Talk to your doctor about regular screenings to see if the  aneurysm is getting bigger. Contact a doctor if you:  Have pain in your belly, side, or back.  Have a throbbing feeling in your belly.  Have a family history of aneurysms. Get help right away if you:  Have sudden, bad pain in your belly, side, or back.  Feel sick to your stomach.  Throw up.  Have trouble pooping (constipation).  Have trouble peeing (urinating).  Feel light-headed.  Have a fast heart rate when you stand.  Have sweaty skin that is cold to the touch (clammy).  Have shortness of breath.  Have a fever. These symptoms may be an emergency. Do not wait to see if the symptoms will go away. Get medical help right away. Call your local emergency services (911 in the [place] Do not drive yourself to the hospital. Summary  An aneurysm is a bulge in one of the blood vessels that carry blood away from the heart (artery). Some aneurysms may not cause problems.  You may need to have yours checked often. If it grows, it can burst or tear. This causes bleeding inside the body. It needs to be treated right away.  Follow instructions from your doctor about healthy lifestyle changes.  Keep all follow-up visits as told by your doctor. This is important. This information is not intended to replace advice given to you by your health care provider. Make sure you discuss any questions you have with your health care provider. Document Revised: 05/10/2018 Document Reviewed: 08/29/2017 Elsevier Patient Education  [place].

## 2019-06-07 NOTE — Assessment & Plan Note (Signed)
I discussed the case with her oncologist.  She has received radiation treatment and ultimately this may be a terminal problem, with a median survival of 1 to 2 years it sounds like.  This may be the underlying cause of her pain but it is difficult to discern.

## 2019-06-07 NOTE — Assessment & Plan Note (Signed)
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  The stent graft was in place and no obvious type I or III endoleak was identified.  I have independently reviewed the CT scan. I had a long discussion with the patient and her daughter today.  They are somewhat desperate to get her out of pain because she has been quite miserable for the last couple of months.  I have told them that I do not think that the aneurysm is likely to be the cause of her pain, but it certainly could be.  By size criteria and the fact that has grown, it clearly needs to be treated.  I discussed that for type II endoleak's our general treatment algorithm would include angiography with coil embolization of the branches feeding the aneurysm sac.  Bilateral femoral access may be required depending on which branches are feeding the aneurysm sac.  This would be expected to be an outpatient procedure.  I discussed the risks and benefits the procedure.  They are desirous to proceed as soon as possible.

## 2019-06-07 NOTE — Progress Notes (Signed)
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 gastrointestinal process. Colonic diverticulosis. Appendix not visualized. No secondary signs of appendicitis. Vascular/Lymphatic: Large endo sac with signs of type 2 endoleak with peripheral increased density showing increase in density from the venous phase to the delay within the endo sac. Caliber approximately 6.5 x 6.5 cm this measured approximately 6.3 x  6.4 cm on the study of February 28, 2019. No adenopathy in the retroperitoneum. No pelvic lymphadenopathy. Reproductive: Post hysterectomy. Urinary bladder is decompressed, limiting assessment. Other: No abdominal ascites. No peritoneal nodularity. Musculoskeletal: No acute bone finding. No destructive bone process. Spinal degenerative changes. IMPRESSION: 1. Spiculated mass along the posterior RIGHT lower lobe is obscured by volume loss and pleural fluid. 2. No adenopathy or metastatic disease. 3. Moderate size RIGHT pleural effusion. 4. Endosac following aorto bi-iliac endograft placement greater than 6 cm (6.5 x 6.5 cm) with suspicion for type 2 endoleak. Vascular surgery assessment may be warranted, CT angiography could be performed as clinically indicated. 5. Cardiomegaly with signs of mitral annular calcification. 6. Dilated central pulmonary arteries, can be seen in the setting of pulmonary arterial hypertension. 7. Colonic diverticulosis without evidence of diverticulitis. 8. Scarring about the  LEFT axilla following LEFT mastectomy and axillary dissection. 9. Emphysema and aortic atherosclerosis. These results will be called to the ordering clinician or representative by the Radiologist Assistant, and communication documented in the PACS or [employer]. Aortic Atherosclerosis (ICD10-I70.0) and Emphysema (ICD10-J43.9). Electronically Signed   By: Zetta Bills M.D.   On: 06/01/2019 16:07   CT ABDOMEN PELVIS W CONTRAST  Result Date: 06/01/2019 CLINICAL DATA:  History of lung cancer post radiation now with new abdominal pain also with history of abdominal aneurysm. EXAM: CT CHEST, ABDOMEN, AND PELVIS WITH CONTRAST TECHNIQUE: Multidetector CT imaging of the chest, abdomen and pelvis was performed following the standard protocol during bolus administration of intravenous contrast. CONTRAST:  148mL OMNIPAQUE IOHEXOL 300 MG/ML  SOLN COMPARISON:  02/28/2019 multiple prior CT evaluations, most recent  dedicated CT chest from February 09, 2019 FINDINGS: CT CHEST FINDINGS Cardiovascular: Heart size enlarged with signs of mitral annular calcification, finding similar to the prior study. Calcified and noncalcified plaque in the thoracic aorta with similar appearance to previous exam. Central pulmonary arteries are moderately dilated at 3.2 cm. No pericardial fluid. Mediastinum/Nodes: Heterogeneity of the thyroid without enlargement. No mediastinal adenopathy. Distortion of RIGHT hilum. No gross hilar adenopathy. No LEFT hilar adenopathy or subcarinal adenopathy. No retrocrural adenopathy. Scarring about the LEFT axilla following LEFT mastectomy and axillary dissection without change. Presumed small seroma and or postoperative scarring along the LEFT lower chest is unchanged. Lungs/Pleura: Spiculated mass along the RIGHT posterior lower lobe is obscured by volume loss and pleural fluid. There is a moderate size RIGHT pleural effusion. Discrete measurement of the previous mass is not possible due to volume loss. Emphysema as before. Post radiation changes along the LEFT chest wall in the setting of prior LEFT mastectomy. Airways are patent. Musculoskeletal: Signs of previous LEFT mastectomy. No chest wall mass. See below for full musculoskeletal details. CT ABDOMEN PELVIS FINDINGS Hepatobiliary: Liver without focal, suspicious lesion. Post cholecystectomy baseline of biliary ductal distension similar to prior studies. Pancreas: Normal without ductal dilation or inflammation. Spleen: Spleen normal size without focal lesion. Adrenals/Urinary Tract: Adrenal glands are normal. Small low-density lesions in the kidneys likely cysts. No suspicious renal lesion or hydronephrosis. Stomach/Bowel: No acute gastrointestinal process. Colonic diverticulosis. Appendix not visualized. No secondary signs of appendicitis. Vascular/Lymphatic: Large endo sac with signs of type 2 endoleak with peripheral increased density showing increase in  density from the venous phase to the delay within the endo sac. Caliber approximately 6.5 x 6.5 cm this measured approximately 6.3 x 6.4 cm on the study of February 28, 2019. No adenopathy in the retroperitoneum. No pelvic lymphadenopathy. Reproductive: Post hysterectomy. Urinary bladder is decompressed, limiting assessment. Other: No abdominal ascites. No peritoneal nodularity. Musculoskeletal: No acute bone finding. No destructive bone process. Spinal degenerative changes. IMPRESSION: 1. Spiculated mass along the posterior RIGHT lower lobe is obscured by volume loss and pleural fluid. 2. No adenopathy or metastatic disease. 3. Moderate size RIGHT pleural effusion. 4. Endosac following aorto bi-iliac endograft placement greater than 6 cm (6.5 x 6.5 cm) with suspicion for type 2 endoleak. Vascular surgery assessment may be warranted, CT angiography could be performed as clinically indicated. 5. Cardiomegaly with signs of mitral annular calcification. 6. Dilated central pulmonary arteries, can be seen in the setting of pulmonary arterial hypertension. 7. Colonic diverticulosis without evidence of diverticulitis. 8. Scarring about the LEFT axilla following LEFT mastectomy and axillary dissection. 9. Emphysema and aortic atherosclerosis. These results will be called to the ordering clinician or representative by  the Psychologist, clinical, and communication documented in the PACS or [employer]. Aortic Atherosclerosis (ICD10-I70.0) and Emphysema (ICD10-J43.9). Electronically Signed   By: Zetta Bills M.D.   On: 06/01/2019 16:07   US THORACENTESIS ASP PLEURAL SPACE W/IMG GUIDE  Result Date: 06/03/2019 INDICATION: Patient with history of adenocarcinoma of the lung and breast cancer status post left mastectomy presents for therapeutic and diagnostic thoracentesis EXAM: ULTRASOUND GUIDED THERAPEUTIC AND DIAGNOSTIC THORACENTESIS MEDICATIONS: Lidocaine 1% 10 mL COMPLICATIONS: None immediate. PROCEDURE: An ultrasound  guided thoracentesis was thoroughly discussed with the patient and questions answered. The benefits, risks, alternatives and complications were also discussed. The patient understands and wishes to proceed with the procedure. Written consent was obtained. Ultrasound was performed to localize and mark an adequate pocket of fluid in the right sided chest. The area was then prepped and draped in the normal sterile fashion. 1% Lidocaine was used for local anesthesia. Under ultrasound guidance a 6 Fr Safe-T-Centesis catheter was introduced. Thoracentesis was performed. The catheter was removed and a dressing applied. FINDINGS: A total of approximately 900 mL of serosanguineous fluid was removed. Samples were sent to the laboratory as requested by the clinical team. IMPRESSION: Successful ultrasound guided therapeutic and diagnostic right sided thoracentesis yielding 900 mL of pleural fluid. Read by Rushie Nyhan NP Electronically Signed   By: Corrie Mckusick D.O.   On: 06/03/2019 11:04    Assessment/Plan Essential hypertension blood pressure control important in reducing the progression of atherosclerotic disease. On appropriate oral medications.   Lymphedema With the use of the lymphedema pump her symptoms are essentially resolved at this point.  Uses this 1 hour daily and it is doing a great job.  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  She has received radiation treatment and ultimately this may be a terminal problem, with a median survival of 1 to 2 years it sounds like.  This may be the underlying cause of her pain but it is difficult to discern.  AAA (abdominal aortic aneurysm) without rupture (HCC) A recent CT scan of the abdomen pelvis that was done as part of her oncologic work-up revealed a 6.5 cm aneurysm sac with what appeared to be a type II endoleak.  The stent graft was in place and no obvious type I or III endoleak was  identified.  I have independently reviewed the CT scan. I had a long discussion with the patient and her daughter today.  They are somewhat desperate to get her out of pain because she has been quite miserable for the last couple of months.  I have told them that I do not think that the aneurysm is likely to be the cause of her pain, but it certainly could be.  By size criteria and the fact that has grown, it clearly needs to be treated.  I discussed that for type II endoleak's our general treatment algorithm would include angiography with coil embolization of the branches feeding the aneurysm sac.  Bilateral femoral access may be required depending on which branches are feeding the aneurysm sac.  This would be expected to be an outpatient procedure.  I discussed the risks and benefits the procedure.  They are desirous to proceed as soon as possible.    Leotis Pain, MD  06/07/2019 4:45 PM    This note was created with Dragon medical transcription system.  Any errors from dictation are purely unintentional

## 2019-06-08 ENCOUNTER — Telehealth (INDEPENDENT_AMBULATORY_CARE_PROVIDER_SITE_OTHER): Payer: Self-pay

## 2019-06-08 ENCOUNTER — Other Ambulatory Visit (INDEPENDENT_AMBULATORY_CARE_PROVIDER_SITE_OTHER): Payer: Self-pay | Admitting: Nurse Practitioner

## 2019-06-08 NOTE — Telephone Encounter (Signed)
Spoke with the patient's daughter and the patient is scheduled with Dr. Lucky Cowboy for a Endoleak eval aortogram on 06/10/19 with a 10:00 am arrival time to the [facility]. Patient will do covid testing on 06/09/19 between 8-1 pm at the [facility]. Pre-procedure instructions were discussed with the daughter.

## 2019-06-09 ENCOUNTER — Other Ambulatory Visit: Payer: Self-pay

## 2019-06-09 ENCOUNTER — Other Ambulatory Visit
Admission: RE | Admit: 2019-06-09 | Discharge: 2019-06-09 | Disposition: A | Discharge status: Home / Self Care | Payer: Medicare Other | Source: Ambulatory Visit | Attending: Vascular Surgery | Admitting: Vascular Surgery

## 2019-06-09 ENCOUNTER — Telehealth: Payer: Self-pay

## 2019-06-09 ENCOUNTER — Inpatient Hospital Stay
Admission: RE | Admit: 2019-06-09 | Discharge: 2019-06-09 | Disposition: A | Discharge status: Home / Self Care | Payer: Medicare Other | Source: Ambulatory Visit | Attending: Internal Medicine | Admitting: Internal Medicine

## 2019-06-09 DIAGNOSIS — I97621 Postprocedural hematoma of a circulatory system organ or structure following other procedure: Secondary | ICD-10-CM | POA: Diagnosis not present

## 2019-06-09 DIAGNOSIS — C3431 Malignant neoplasm of lower lobe, right bronchus or lung: Secondary | ICD-10-CM

## 2019-06-09 DIAGNOSIS — S7011XA Contusion of right thigh, initial encounter: Secondary | ICD-10-CM | POA: Diagnosis not present

## 2019-06-09 DIAGNOSIS — I9789 Other postprocedural complications and disorders of the circulatory system, not elsewhere classified: Secondary | ICD-10-CM | POA: Diagnosis not present

## 2019-06-09 DIAGNOSIS — Z853 Personal history of malignant neoplasm of breast: Secondary | ICD-10-CM | POA: Diagnosis not present

## 2019-06-09 DIAGNOSIS — Z95828 Presence of other vascular implants and grafts: Secondary | ICD-10-CM | POA: Diagnosis not present

## 2019-06-09 DIAGNOSIS — I10 Essential (primary) hypertension: Secondary | ICD-10-CM | POA: Diagnosis not present

## 2019-06-09 DIAGNOSIS — Z79899 Other long term (current) drug therapy: Secondary | ICD-10-CM | POA: Diagnosis not present

## 2019-06-09 DIAGNOSIS — I714 Abdominal aortic aneurysm, without rupture: Secondary | ICD-10-CM | POA: Diagnosis not present

## 2019-06-09 DIAGNOSIS — Z9012 Acquired absence of left breast and nipple: Secondary | ICD-10-CM | POA: Diagnosis not present

## 2019-06-09 DIAGNOSIS — I728 Aneurysm of other specified arteries: Secondary | ICD-10-CM | POA: Diagnosis not present

## 2019-06-09 DIAGNOSIS — C3490 Malignant neoplasm of unspecified part of unspecified bronchus or lung: Secondary | ICD-10-CM | POA: Diagnosis not present

## 2019-06-09 DIAGNOSIS — Z20822 Contact with and (suspected) exposure to covid-19: Secondary | ICD-10-CM | POA: Diagnosis not present

## 2019-06-09 DIAGNOSIS — D62 Acute posthemorrhagic anemia: Secondary | ICD-10-CM | POA: Diagnosis not present

## 2019-06-09 LAB — SARS CORONAVIRUS 2 (TAT 6-24 HRS): SARS Coronavirus 2: NEGATIVE

## 2019-06-09 LAB — GLUCOSE, CAPILLARY: Glucose-Capillary: 104 mg/dL — ABNORMAL HIGH (ref 70–99)

## 2019-06-09 MED ORDER — FLUDEOXYGLUCOSE F - 18 (FDG) INJECTION
10.3000 | Freq: Once | INTRAVENOUS | Status: AC | PRN
  Administered 2019-06-09: 11.12 via INTRAVENOUS

## 2019-06-09 MED ORDER — CLINDAMYCIN PHOSPHATE 300 MG/50ML IV SOLN: 300.0000 mg | Freq: Once | INTRAVENOUS | Status: AC

## 2019-06-09 NOTE — Telephone Encounter (Signed)
Patient cancelled appointment on 06/17/2019 will call back to reschedule. klh

## 2019-06-10 ENCOUNTER — Other Ambulatory Visit: Payer: Self-pay

## 2019-06-10 ENCOUNTER — Encounter: Payer: Self-pay | Admitting: Certified Registered"

## 2019-06-10 ENCOUNTER — Encounter: Payer: Self-pay | Admitting: Vascular Surgery

## 2019-06-10 ENCOUNTER — Encounter
Admission: RE | Disposition: A | Discharge status: Home / Self Care | Payer: Self-pay | Source: Home / Self Care | Attending: Vascular Surgery

## 2019-06-10 ENCOUNTER — Inpatient Hospital Stay
Admission: RE | Admit: 2019-06-10 | Discharge: 2019-06-12 | DRG: 271 | Disposition: A | Discharge status: Home / Self Care | Payer: Medicare Other | Attending: Surgery | Admitting: Surgery

## 2019-06-10 ENCOUNTER — Ambulatory Visit: Payer: Medicare Other | Admitting: Radiation Oncology

## 2019-06-10 ENCOUNTER — Inpatient Hospital Stay: Payer: Medicare Other | Admitting: Internal Medicine

## 2019-06-10 ENCOUNTER — Observation Stay: Payer: Medicare Other

## 2019-06-10 DIAGNOSIS — Z9012 Acquired absence of left breast and nipple: Secondary | ICD-10-CM

## 2019-06-10 DIAGNOSIS — Z79899 Other long term (current) drug therapy: Secondary | ICD-10-CM

## 2019-06-10 DIAGNOSIS — I714 Abdominal aortic aneurysm, without rupture, unspecified: Secondary | ICD-10-CM

## 2019-06-10 DIAGNOSIS — D62 Acute posthemorrhagic anemia: Secondary | ICD-10-CM | POA: Diagnosis present

## 2019-06-10 DIAGNOSIS — Z20822 Contact with and (suspected) exposure to covid-19: Secondary | ICD-10-CM | POA: Diagnosis present

## 2019-06-10 DIAGNOSIS — I9789 Other postprocedural complications and disorders of the circulatory system, not elsewhere classified: Principal | ICD-10-CM | POA: Diagnosis present

## 2019-06-10 DIAGNOSIS — C3431 Malignant neoplasm of lower lobe, right bronchus or lung: Secondary | ICD-10-CM | POA: Diagnosis present

## 2019-06-10 DIAGNOSIS — Y832 Surgical operation with anastomosis, bypass or graft as the cause of abnormal reaction of the patient, or of later complication, without mention of misadventure at the time of the procedure: Secondary | ICD-10-CM | POA: Diagnosis present

## 2019-06-10 DIAGNOSIS — S7011XA Contusion of right thigh, initial encounter: Secondary | ICD-10-CM | POA: Diagnosis not present

## 2019-06-10 DIAGNOSIS — I728 Aneurysm of other specified arteries: Secondary | ICD-10-CM | POA: Diagnosis present

## 2019-06-10 DIAGNOSIS — T148XXA Other injury of unspecified body region, initial encounter: Secondary | ICD-10-CM | POA: Diagnosis present

## 2019-06-10 DIAGNOSIS — C3490 Malignant neoplasm of unspecified part of unspecified bronchus or lung: Secondary | ICD-10-CM

## 2019-06-10 DIAGNOSIS — I10 Essential (primary) hypertension: Secondary | ICD-10-CM | POA: Diagnosis present

## 2019-06-10 DIAGNOSIS — Z853 Personal history of malignant neoplasm of breast: Secondary | ICD-10-CM

## 2019-06-10 DIAGNOSIS — Z95828 Presence of other vascular implants and grafts: Secondary | ICD-10-CM

## 2019-06-10 DIAGNOSIS — I97621 Postprocedural hematoma of a circulatory system organ or structure following other procedure: Secondary | ICD-10-CM | POA: Diagnosis present

## 2019-06-10 HISTORY — PX: ABDOMINAL AORTOGRAM: CATH118222

## 2019-06-10 LAB — CREATININE, SERUM
Creatinine, Ser: 0.84 mg/dL (ref 0.44–1.00)
GFR calc Af Amer: >60 mL/min (ref >60)
GFR calc non Af Amer: 60 mL/min — ABNORMAL LOW (ref >60)

## 2019-06-10 LAB — TYPE AND SCREEN
ABO/RH(D): B NEG
Antibody Screen: NEGATIVE

## 2019-06-10 LAB — CBC
HCT: 36.8 % (ref 36.0–46.0)
Hemoglobin: 11.4 g/dL — ABNORMAL LOW (ref 12.0–15.0)
MCH: 28.5 pg (ref 26.0–34.0)
MCHC: 31 g/dL (ref 30.0–36.0)
MCV: 92 fL (ref 80.0–100.0)
Platelets: 148 10*3/uL — ABNORMAL LOW (ref 150–400)
RBC: 4 MIL/uL (ref 3.87–5.11)
RDW: 14.6 % (ref 11.5–15.5)
WBC: 11.9 10*3/uL — ABNORMAL HIGH (ref 4.0–10.5)
nRBC: 0 % (ref 0.0–0.2)

## 2019-06-10 LAB — BUN: BUN: 14 mg/dL (ref 8–23)

## 2019-06-10 SURGERY — EXPLORATION, ARTERY, FEMORAL
Anesthesia: General

## 2019-06-10 SURGERY — ABDOMINAL AORTOGRAM
Anesthesia: Moderate Sedation

## 2019-06-10 MED ORDER — DIPHENHYDRAMINE HCL 50 MG/ML IJ SOLN
25.0000 mg | Freq: Once | INTRAMUSCULAR | Status: AC
  Administered 2019-06-10: 25 mg via INTRAVENOUS

## 2019-06-10 MED ORDER — HYDROMORPHONE HCL 1 MG/ML IJ SOLN
INTRAMUSCULAR | Status: AC
  Administered 2019-06-10: 0.5 mg via INTRAVENOUS
  Filled 2019-06-10: qty 0.5

## 2019-06-10 MED ORDER — METHYLPREDNISOLONE SODIUM SUCC 125 MG IJ SOLR: 125.0000 mg | Freq: Once | INTRAMUSCULAR | Status: DC | PRN

## 2019-06-10 MED ORDER — SODIUM CHLORIDE 0.9 % IV SOLN: INTRAVENOUS | Status: DC

## 2019-06-10 MED ORDER — DIPHENHYDRAMINE HCL 50 MG/ML IJ SOLN
INTRAMUSCULAR | Status: AC
  Filled 2019-06-10: qty 1

## 2019-06-10 MED ORDER — FENTANYL CITRATE (PF) 100 MCG/2ML IJ SOLN: 12.5000 ug | Freq: Once | INTRAMUSCULAR | Status: DC | PRN

## 2019-06-10 MED ORDER — IBUPROFEN 600 MG PO TABS
600.0000 mg | ORAL_TABLET | Freq: Four times a day (QID) | ORAL | Status: DC | PRN
  Administered 2019-06-11 – 2019-06-12 (×2): 600 mg via ORAL
  Filled 2019-06-10 (×4): qty 1

## 2019-06-10 MED ORDER — MIDAZOLAM HCL 2 MG/ML PO SYRP: 8.0000 mg | ORAL_SOLUTION | Freq: Once | ORAL | Status: DC | PRN

## 2019-06-10 MED ORDER — IODIXANOL 320 MG/ML IV SOLN
INTRAVENOUS | Status: DC | PRN
  Administered 2019-06-10: 75 mL

## 2019-06-10 MED ORDER — MORPHINE SULFATE (PF) 4 MG/ML IV SOLN: 2.0000 mg | INTRAVENOUS | Status: DC | PRN

## 2019-06-10 MED ORDER — FENTANYL CITRATE (PF) 100 MCG/2ML IJ SOLN
INTRAMUSCULAR | Status: AC
  Filled 2019-06-10: qty 2

## 2019-06-10 MED ORDER — HEPARIN SODIUM (PORCINE) 1000 UNIT/ML IJ SOLN
INTRAMUSCULAR | Status: AC
  Filled 2019-06-10: qty 1

## 2019-06-10 MED ORDER — MORPHINE SULFATE (PF) 2 MG/ML IV SOLN
INTRAVENOUS | Status: AC
  Administered 2019-06-10: 2 mg via INTRAVENOUS
  Filled 2019-06-10: qty 1

## 2019-06-10 MED ORDER — LOSARTAN POTASSIUM 50 MG PO TABS
50.0000 mg | ORAL_TABLET | Freq: Every day | ORAL | Status: DC
  Administered 2019-06-12: 50 mg via ORAL
  Filled 2019-06-10 (×3): qty 1

## 2019-06-10 MED ORDER — FENTANYL CITRATE (PF) 100 MCG/2ML IJ SOLN
INTRAMUSCULAR | Status: DC | PRN
  Administered 2019-06-10 (×4): 25 ug via INTRAVENOUS

## 2019-06-10 MED ORDER — MIDAZOLAM HCL 5 MG/5ML IJ SOLN
INTRAMUSCULAR | Status: AC
  Filled 2019-06-10: qty 5

## 2019-06-10 MED ORDER — ALBUTEROL SULFATE (2.5 MG/3ML) 0.083% IN NEBU: 2.5000 mg | INHALATION_SOLUTION | Freq: Four times a day (QID) | RESPIRATORY_TRACT | Status: DC | PRN

## 2019-06-10 MED ORDER — LACTATED RINGERS IV SOLN: INTRAVENOUS | Status: DC

## 2019-06-10 MED ORDER — BACITRACIN-NEOMYCIN-POLYMYXIN 400-5-5000 EX OINT
TOPICAL_OINTMENT | CUTANEOUS | Status: AC
  Filled 2019-06-10: qty 1

## 2019-06-10 MED ORDER — HEPARIN SODIUM (PORCINE) 5000 UNIT/ML IJ SOLN: 5000.0000 [IU] | Freq: Three times a day (TID) | INTRAMUSCULAR | Status: DC

## 2019-06-10 MED ORDER — LACTATED RINGERS IV BOLUS
500.0000 mL | Freq: Once | INTRAVENOUS | Status: AC
  Administered 2019-06-10: 500 mL via INTRAVENOUS

## 2019-06-10 MED ORDER — MIDAZOLAM HCL 2 MG/2ML IJ SOLN
INTRAMUSCULAR | Status: DC | PRN
  Administered 2019-06-10 (×5): 1 mg via INTRAVENOUS

## 2019-06-10 MED ORDER — CLINDAMYCIN PHOSPHATE 300 MG/50ML IV SOLN
INTRAVENOUS | Status: AC
  Administered 2019-06-10: 300 mg via INTRAVENOUS
  Filled 2019-06-10: qty 50

## 2019-06-10 MED ORDER — IOHEXOL 350 MG/ML SOLN
125.0000 mL | Freq: Once | INTRAVENOUS | Status: AC | PRN
  Administered 2019-06-10: 18:00:00 125 mL via INTRAVENOUS

## 2019-06-10 MED ORDER — FAMOTIDINE 20 MG PO TABS: 40.0000 mg | ORAL_TABLET | Freq: Once | ORAL | Status: DC | PRN

## 2019-06-10 MED ORDER — CARVEDILOL 3.125 MG PO TABS
3.1250 mg | ORAL_TABLET | Freq: Every day | ORAL | Status: DC
  Administered 2019-06-10 – 2019-06-11 (×2): 3.125 mg via ORAL
  Filled 2019-06-10 (×3): qty 1

## 2019-06-10 MED ORDER — DIPHENHYDRAMINE HCL 50 MG/ML IJ SOLN: 50.0000 mg | Freq: Once | INTRAMUSCULAR | Status: DC | PRN

## 2019-06-10 MED ORDER — ACETAMINOPHEN 325 MG PO TABS: 650.0000 mg | ORAL_TABLET | Freq: Four times a day (QID) | ORAL | Status: DC | PRN

## 2019-06-10 MED ORDER — ONDANSETRON HCL 4 MG/2ML IJ SOLN: 4.0000 mg | Freq: Four times a day (QID) | INTRAMUSCULAR | Status: DC | PRN

## 2019-06-10 MED ORDER — HEPARIN SODIUM (PORCINE) 1000 UNIT/ML IJ SOLN
INTRAMUSCULAR | Status: DC | PRN
  Administered 2019-06-10: 3000 [IU] via INTRAVENOUS

## 2019-06-10 MED ORDER — HYDROMORPHONE HCL 1 MG/ML IJ SOLN: 0.5000 mg | Freq: Once | INTRAMUSCULAR | Status: AC

## 2019-06-10 SURGICAL SUPPLY — 28 items
CANNULA 5F STIFF (CANNULA) ×2 IMPLANT
CATH BEACON 5 .035 65 RIM TIP (CATHETERS) ×1 IMPLANT
CATH LANTERN 025 150CM 45TIP (MICROCATHETER) ×1 IMPLANT
CATH MICROCATH PRGRT 2.8F 110 (CATHETERS) IMPLANT
CATH PIG 70CM (CATHETERS) ×1 IMPLANT
CATH VS15FR (CATHETERS) ×1 IMPLANT
COIL 400 COMPLEX SOFT 3X15CM (Vascular Products) ×2 IMPLANT
COIL 400 COMPLEX SOFT 3X5CM (Vascular Products) ×2 IMPLANT
COIL 400 COMPLEX SOFT 4X6CM (Vascular Products) ×2 IMPLANT
DEVICE OCCLUSION PODJ15 (Vascular Products) IMPLANT
DEVICE OCCLUSION PODJ45 (Vascular Products) IMPLANT
DEVICE STARCLOSE SE CLOSURE (Vascular Products) ×2 IMPLANT
DEVICE TORQUE .025-.038 (MISCELLANEOUS) ×1 IMPLANT
FEM STOP ARCH (HEMOSTASIS) ×2
GLIDEWIRE STIFF .35X180X3 HYDR (WIRE) ×1 IMPLANT
GUIDEWIRE PFTE-COATED .018X300 (WIRE) ×1 IMPLANT
GUIDEWIRE SUPER STIFF .035X180 (WIRE) ×1 IMPLANT
HANDLE DETACHMENT COIL (MISCELLANEOUS) ×1 IMPLANT
MICROCATH PROGREAT 2.8F 110 CM (CATHETERS) ×2
OCCLUSION DEVICE PODJ15 (Vascular Products) ×2 IMPLANT
OCCLUSION DEVICE PODJ45 (Vascular Products) ×2 IMPLANT
PACK ANGIOGRAPHY (CUSTOM PROCEDURE TRAY) ×1 IMPLANT
SHEATH BRITE TIP 4FRX11 (SHEATH) ×1 IMPLANT
SHEATH BRITE TIP 5FRX11 (SHEATH) ×2 IMPLANT
SYSTEM COMPRESSION FEMOSTOP (HEMOSTASIS) IMPLANT
TUBING CONTRAST HIGH PRESS 72 (TUBING) ×1 IMPLANT
WIRE G V18X300CM (WIRE) ×1 IMPLANT
WIRE J 3MM .035X145CM (WIRE) ×1 IMPLANT

## 2019-06-10 SURGICAL SUPPLY — 69 items
ADH SKN CLS APL DERMABOND .7 (GAUZE/BANDAGES/DRESSINGS) ×1
APL PRP STRL LF DISP 70% ISPRP (MISCELLANEOUS) ×1
APPLIER CLIP 11 MED OPEN (CLIP)
APPLIER CLIP 9.375 SM OPEN (CLIP)
APR CLP MED 11 20 MLT OPN (CLIP)
APR CLP SM 9.3 20 MLT OPN (CLIP)
BAG COUNTER SPONGE EZ (MISCELLANEOUS) ×3 IMPLANT
BAG DECANTER FOR FLEXI CONT (MISCELLANEOUS) ×3 IMPLANT
BAG ISL LRG 20X20 DRWSTRG (DRAPES)
BAG ISOLATATION DRAPE 20X20 ST (DRAPES) IMPLANT
BAG SPNG 4X4 CLR HAZ (MISCELLANEOUS) ×1
BLADE SURG 15 STRL LF DISP TIS (BLADE) ×2 IMPLANT
BLADE SURG 15 STRL SS (BLADE) ×2
BLADE SURG SZ11 CARB STEEL (BLADE) ×3 IMPLANT
BOOT SUTURE AID YELLOW STND (SUTURE) ×3 IMPLANT
BRUSH SCRUB EZ  4% CHG (MISCELLANEOUS) ×1
BRUSH SCRUB EZ 4% CHG (MISCELLANEOUS) ×2 IMPLANT
CANISTER SUCT 1200ML W/VALVE (MISCELLANEOUS) ×3 IMPLANT
CHLORAPREP W/TINT 26 (MISCELLANEOUS) ×3 IMPLANT
CLIP APPLIE 11 MED OPEN (CLIP) IMPLANT
CLIP APPLIE 9.375 SM OPEN (CLIP) IMPLANT
COVER WAND RF STERILE (DRAPES) ×3 IMPLANT
DERMABOND ADVANCED (GAUZE/BANDAGES/DRESSINGS) ×1
DERMABOND ADVANCED .7 DNX12 (GAUZE/BANDAGES/DRESSINGS) ×2 IMPLANT
DRAPE INCISE IOBAN 66X45 STRL (DRAPES) ×3 IMPLANT
DRAPE ISOLATE BAG 20X20 STRL (DRAPES)
ELECT CAUTERY BLADE 6.4 (BLADE) ×3 IMPLANT
ELECT REM PT RETURN 9FT ADLT (ELECTROSURGICAL) ×2
ELECTRODE REM PT RTRN 9FT ADLT (ELECTROSURGICAL) ×2 IMPLANT
GLOVE BIO SURGEON STRL SZ7 (GLOVE) ×6 IMPLANT
GLOVE INDICATOR 7.5 STRL GRN (GLOVE) ×3 IMPLANT
GOWN STRL REUS W/ TWL LRG LVL3 (GOWN DISPOSABLE) ×2 IMPLANT
GOWN STRL REUS W/ TWL XL LVL3 (GOWN DISPOSABLE) ×4 IMPLANT
GOWN STRL REUS W/TWL LRG LVL3 (GOWN DISPOSABLE) ×2
GOWN STRL REUS W/TWL XL LVL3 (GOWN DISPOSABLE) ×4
HEMOSTAT SURGICEL 2X3 (HEMOSTASIS) ×3 IMPLANT
IV NS 500ML (IV SOLUTION) ×2
IV NS 500ML BAXH (IV SOLUTION) ×2 IMPLANT
KIT TURNOVER KIT A (KITS) ×3 IMPLANT
LABEL OR SOLS (LABEL) ×3 IMPLANT
LOOP RED MAXI  1X406MM (MISCELLANEOUS) ×2
LOOP VESSEL MAXI 1X406 RED (MISCELLANEOUS) ×4 IMPLANT
LOOP VESSEL MINI 0.8X406 BLUE (MISCELLANEOUS) ×4 IMPLANT
LOOPS BLUE MINI 0.8X406MM (MISCELLANEOUS) ×2
NDL SAFETY ECLIPSE 18X1.5 (NEEDLE) ×2 IMPLANT
NEEDLE HYPO 18GX1.5 SHARP (NEEDLE) ×2
NS IRRIG 500ML POUR BTL (IV SOLUTION) ×3 IMPLANT
PACK BASIN MAJOR (MISCELLANEOUS) ×3 IMPLANT
PACK UNIVERSAL (MISCELLANEOUS) ×3 IMPLANT
SUT MNCRL 4-0 (SUTURE) ×2
SUT MNCRL 4-0 27XMFL (SUTURE) ×1
SUT PROLENE 5 0 RB 1 DA (SUTURE) ×6 IMPLANT
SUT PROLENE 6 0 BV (SUTURE) ×12 IMPLANT
SUT PROLENE 7 0 BV 1 (SUTURE) ×6 IMPLANT
SUT SILK 2 0 (SUTURE) ×2
SUT SILK 2-0 18XBRD TIE 12 (SUTURE) ×2 IMPLANT
SUT SILK 3 0 (SUTURE) ×2
SUT SILK 3-0 18XBRD TIE 12 (SUTURE) ×2 IMPLANT
SUT SILK 4 0 (SUTURE) ×2
SUT SILK 4-0 18XBRD TIE 12 (SUTURE) ×2 IMPLANT
SUT VIC AB 2-0 CT1 27 (SUTURE) ×4
SUT VIC AB 2-0 CT1 TAPERPNT 27 (SUTURE) ×4 IMPLANT
SUT VIC AB 3-0 SH 27 (SUTURE) ×2
SUT VIC AB 3-0 SH 27X BRD (SUTURE) ×2 IMPLANT
SUT VICRYL+ 3-0 36IN CT-1 (SUTURE) ×6 IMPLANT
SUTURE MNCRL 4-0 27XMF (SUTURE) ×2 IMPLANT
SYR 20ML LL LF (SYRINGE) ×3 IMPLANT
SYR 5ML LL (SYRINGE) ×3 IMPLANT
TRAY FOLEY MTR SLVR 16FR STAT (SET/KITS/TRAYS/PACK) ×3 IMPLANT

## 2019-06-10 NOTE — H&P (Signed)
[facility] History & Physical Update  The patient was interviewed and re-examined.  The patient's previous History and Physical has been reviewed and is unchanged.  There is no change in the plan of care. We plan to proceed with the scheduled procedure.  Leotis Pain, MD  06/10/2019, 10:41 AM

## 2019-06-10 NOTE — Progress Notes (Signed)
Patient with significant size right groin hematoma following the procedure. Significant time of manual pressure and with the size of the hematoma, we proceeded with a CT scan to make sure there is no active extravasation or pseudoaneurysm. Official read is not any, but I have reviewed the CT scan and do not see any active extravasation or ongoing bleeding. The hematoma is at least moderate in size. We will continue with the pressure dressing and monitor overnight taking CBCs and vital signs. Admit for observation.
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
## 2019-06-10 NOTE — Op Note (Signed)
[facility] Percutaneous Study/Intervention Procedural Note     Surgeon(s): [employer]  Assistants: none  Pre-operative Diagnosis: 1.  Type II endoleak with enlarging aneurysm sac 2.  Lung cancer  Post-operative diagnosis: Same  Procedure(s) Performed: 1. Ultrasound guidance for vascular access bilateral femoral arteries 2. Catheter placement into tertiary right hypogastric artery branches from right femoral approach, catheter placement is tertiary SMA branches all the way into the IMA through the SMA from the right femoral approach, and catheter placement is tertiary left hypogastric artery branches from the left femoral approach 3. Aortogram and selective angiogram of the right hypogastric artery and branches, the SMA, the IMA through the SMA, and the left hypogastric artery and branches 4. Coil embolization of the right hypogastric artery tertiary branch feeding the aneurysm sac with a 3 mm diameter 15 cm soft coil and a 15 cm long packing coil  5.  Coil embolization of 2 left hypogastric artery tertiary branches feeding the aneurysm sac with a series of Ruby coils.  For the smaller branch this was a 3 mm x 15 cm and a 3 mm x 5 cm soft coil and then a 45 cm long packing coil and then to 4 mm x 6 cm length soft coils 6. StarClose closure device bilateral femoral arteries  Anesthesia: Moderate conscious sedation for approximately 90 minutes using 3 mg of Versed and 100 mcg of Fentanyl  EBL: 25 cc  Fluoro Time: 26.4 minutes  Contrast: 75 cc  Indications: Patient is a 86 y.o.female with an enlarging aneurysm sac and type II endoleak seen on a previous CT scan. The patient is brought in for angiography for further evaluation and potential treatment. Risks and benefits are discussed and informed consent is obtained  Procedure: The patient was identified and appropriate  procedural time out was performed. The patient was then placed supine on the table and prepped and draped in the usual sterile fashion.Moderate conscious sedation was administered during a face to face encounter with the patient throughout the procedure with my supervision of the RN administering medicines and monitoring the patient's vital signs, pulse oximetry, telemetry and mental status throughout from the start of the procedure until the patient was taken to the recovery room.  Ultrasound was used to evaluate the right common femoral artery. It was patent . A digital ultrasound image was acquired. A Seldinger needle was used to access the right common femoral artery under direct ultrasound guidance and a permanent image was performed. A 0.035 J wire was advanced without resistance and a 5Fr sheath was placed. Pigtail catheter was placed into the aorta and an AP aortogram was performed. This demonstrated brisk flow through the stent graft with no type I or III endoleak although clear reasonably large type II endoleak's were seen throughout the lumbar collaterals.  These appear to be coming from both sides. I started by turning my attention to the right hypogastric artery.  This was cannulated with a Glidewire and a rim catheter without difficulty where selective imaging was performed.  The branch feeding the aneurysm sac was beyond the primary bifurcation of the right hypogastric artery and a secondary hypogastric artery branch and then branch several more times has fed the aneurysm sac.  Using the progreat microcatheter and the rim catheter I was able to selectively cannulate this secondary right hypogastric artery branch that was going superior and slowly advance this catheter all the way up to a few centimeters from the aneurysm sac.  Selective imaging  of this branch was performed showing it feeding the sac.  I then performed coil embolization of this branch with a 3 mm diameter by 15 cm length soft  Ruby coil followed by 15 cm packing Ruby coil back to near the origin of the superior going branch.  Selective imaging through the rim catheter demonstrates no obvious filling of the aneurysm sac following coil embolization from the right hypogastric artery.   I then turned my attention to the SMA to evaluate if branches off of the SMA or IMA were feeding the aneurysm sac.  We transitioned to the lateral projection to cannulate the SMA. A V S1 catheter was used to selectively cannulate the SMA.  This demonstrated a typical configuration of the SMA without proximal stenosis with a large number of branches.  No obvious filling of the aneurysm sac was seen on this initial image, but a large splenic flexure branch fed down and collateralized with the IMA and this could be easily seen.  I advanced first a prograde microcatheter out this branch and then used a V 18 wire to exchange for a number lantern catheter that was 150 cm in length.  Using a 0.018 advantage wire and the lantern catheter I was able to advance all the way down to the IMA.  This included to 360 degree turns and was quite a tedious path.  Once we got to the IMA, selective imaging was performed of the IMA.  This had a reasonably normal course but did not appear to backbleed into the aneurysm sac on any image that I could demonstrate.  For this reason, I did not embolize the IMA. I then turned my attention to the left hypogastric artery.  Due to the existing aneurysm stent graft, up and over access of the left hypogastric artery would not be reasonable.  I accessed the left femoral artery under direct ultrasound guidance with a micropuncture needle and a micropuncture wire and sheath were placed.  A permanent image was recorded.  I then upsized to a 5 [place] sheath.  Imaging through the sheath was performed to demonstrate the takeoff of the hypogastric artery, and then the left hypogastric artery was cannulated with a rim catheter and a Glidewire without  difficulty.  Selective imaging of the left hypogastric artery was performed which showed 2 branches that eventually fed the aneurysm sac.  These came off of a common trunk as the superior branch after the primary bifurcation of the main hypogastric artery.  Using this image as a roadmap, the rim catheter and the Glidewire were used to cannulate this up going branch.  I then used the progreat microcatheter and first selectively cannulated the more superior going branch that was feeding directly into the aneurysm sac.  Tediously, I advanced with the help of the progreat microcatheter and wire and got all the way up to just outside of the aneurysm sac.  Selective imaging showed inflow and outflow through 2 large lumbar branches on each side.  At this location, I started with coil embolization to cut out this path of flow into the aneurysm sac.  A 3 mm diameter by 5 cm soft coil was deployed first and a second 3 mm diameter by 5 mm soft coil was deployed second in this location.  I then used a 45 cm long packing coil to take out the remainder of the superior going branch all the way back to just above the lateral branch and was also feeding the aneurysm sac.  The lateral branch was slightly larger.  Using a prograde microcatheter I was able to advance into this lateral branch without difficulty.  Coil embolization was then performed with a 4 mm diameter by 6 cm soft coil in this lateral branch and then the catheter was pulled down to just below the bifurcation and another 4 mm diameter by 6 cm soft coils deployed to completely embolize this branch and cut off the inflow to both branches.  The prograde microcatheter was then removed.  Imaging performed through the rim catheter showed successful coil embolization and no significant flow was seen into the aneurysm sac at this point.  I elected to terminate the procedure. The diagnostic catheter was removed. StarClose closure device was deployed in usual fashion with  excellent hemostatic result on both sides. The patient was taken to the recovery room in stable condition having tolerated the procedure well.     Findings:Several sizable hypogastric artery branches feeding the aneurysm sac creating a type II endoleak.  These were slightly larger on the left than the right but both were present.  No branches off of the SMA or IMA seem to be feeding the aneurysm sac.  Disposition: Patient was taken to the recovery room in stable condition having tolerated the procedure well.  Complications: None  Leotis Pain 06/10/2019 2:17 PM   This note was created with Dragon Medical transcription system. Any errors in dictation are purely unintentional.

## 2019-06-11 DIAGNOSIS — I97621 Postprocedural hematoma of a circulatory system organ or structure following other procedure: Secondary | ICD-10-CM | POA: Diagnosis present

## 2019-06-11 DIAGNOSIS — C3431 Malignant neoplasm of lower lobe, right bronchus or lung: Secondary | ICD-10-CM | POA: Diagnosis present

## 2019-06-11 DIAGNOSIS — I9789 Other postprocedural complications and disorders of the circulatory system, not elsewhere classified: Secondary | ICD-10-CM | POA: Diagnosis present

## 2019-06-11 DIAGNOSIS — Z95828 Presence of other vascular implants and grafts: Secondary | ICD-10-CM | POA: Diagnosis not present

## 2019-06-11 DIAGNOSIS — I714 Abdominal aortic aneurysm, without rupture: Secondary | ICD-10-CM | POA: Diagnosis present

## 2019-06-11 DIAGNOSIS — I728 Aneurysm of other specified arteries: Secondary | ICD-10-CM | POA: Diagnosis present

## 2019-06-11 DIAGNOSIS — D62 Acute posthemorrhagic anemia: Secondary | ICD-10-CM | POA: Diagnosis present

## 2019-06-11 DIAGNOSIS — Y832 Surgical operation with anastomosis, bypass or graft as the cause of abnormal reaction of the patient, or of later complication, without mention of misadventure at the time of the procedure: Secondary | ICD-10-CM | POA: Diagnosis present

## 2019-06-11 DIAGNOSIS — Z20822 Contact with and (suspected) exposure to covid-19: Secondary | ICD-10-CM | POA: Diagnosis present

## 2019-06-11 DIAGNOSIS — Z853 Personal history of malignant neoplasm of breast: Secondary | ICD-10-CM | POA: Diagnosis not present

## 2019-06-11 DIAGNOSIS — Z9012 Acquired absence of left breast and nipple: Secondary | ICD-10-CM | POA: Diagnosis not present

## 2019-06-11 DIAGNOSIS — I10 Essential (primary) hypertension: Secondary | ICD-10-CM | POA: Diagnosis present

## 2019-06-11 DIAGNOSIS — Z79899 Other long term (current) drug therapy: Secondary | ICD-10-CM | POA: Diagnosis not present

## 2019-06-11 LAB — BASIC METABOLIC PANEL
Anion gap: 8 (ref 5–15)
BUN: 12 mg/dL (ref 8–23)
CO2: 31 mmol/L (ref 22–32)
Calcium: 8.5 mg/dL — ABNORMAL LOW (ref 8.9–10.3)
Chloride: 101 mmol/L (ref 98–111)
Creatinine, Ser: 0.73 mg/dL (ref 0.44–1.00)
GFR calc Af Amer: >60 mL/min (ref >60)
GFR calc non Af Amer: >60 mL/min (ref >60)
Glucose, Bld: 116 mg/dL — ABNORMAL HIGH (ref 70–99)
Potassium: 4 mmol/L (ref 3.5–5.1)
Sodium: 140 mmol/L (ref 135–145)

## 2019-06-11 LAB — CBC
HCT: 37.9 % (ref 36.0–46.0)
Hemoglobin: 11.7 g/dL — ABNORMAL LOW (ref 12.0–15.0)
MCH: 28.7 pg (ref 26.0–34.0)
MCHC: 30.9 g/dL (ref 30.0–36.0)
MCV: 93.1 fL (ref 80.0–100.0)
Platelets: 153 10*3/uL (ref 150–400)
RBC: 4.07 MIL/uL (ref 3.87–5.11)
RDW: 14.9 % (ref 11.5–15.5)
WBC: 7.2 10*3/uL (ref 4.0–10.5)
nRBC: 0 % (ref 0.0–0.2)

## 2019-06-11 LAB — MAGNESIUM: Magnesium: 2 mg/dL (ref 1.7–2.4)

## 2019-06-11 NOTE — Progress Notes (Signed)
    Subjective  - POD #1  Complaining of pain and soreness in her right groin    Physical Exam:  Sitting up in chair. Right groin is soft with ecchymosis and hematoma      Assessment/Plan:  POD #1  The patient has a right groin hematoma.  CT scan last night did not show any active extravasation.  I would feel more comfortable observing the patient for another day to make sure she does not have issues when she goes home.  Acute blood loss anemia: Her hemoglobin has trended up today to 11.7 from 11.4 she appears stable without need for transfusion.  Wells Alban Marucci 06/11/2019 3:58 PM --  Vitals:   06/11/19 0959 06/11/19 1220  BP: 95/63 (!) 104/58  Pulse: 83 97  Resp:  18  Temp:  97.9 F (36.6 C)  SpO2:  97%    Intake/Output Summary (Last 24 hours) at 06/11/2019 1558 Last data filed at 06/11/2019 1330 Gross per 24 hour  Intake 240 ml  Output 300 ml  Net -60 ml     Laboratory CBC    Component Value Date/Time   WBC 7.2 06/11/2019 0506   HGB 11.7 (L) 06/11/2019 0506   HGB 14.0 08/12/2017 1332   HCT 37.9 06/11/2019 0506   HCT 43.1 08/12/2017 1332   PLT 153 06/11/2019 0506   PLT 173 12/23/2013 0513    BMET    Component Value Date/Time   NA 140 06/11/2019 0506   NA 140 12/23/2013 0513   K 4.0 06/11/2019 0506   K 4.1 12/23/2013 0513   CL 101 06/11/2019 0506   CL 104 12/23/2013 0513   CO2 31 06/11/2019 0506   CO2 29 12/23/2013 0513   GLUCOSE 116 (H) 06/11/2019 0506   GLUCOSE 102 (H) 12/23/2013 0513   BUN 12 06/11/2019 0506   BUN 8 12/23/2013 0513   CREATININE 0.73 06/11/2019 0506   CREATININE 0.71 12/23/2013 0513   CALCIUM 8.5 (L) 06/11/2019 0506   CALCIUM 7.6 (L) 12/23/2013 0513   GFRNONAA >60 06/11/2019 0506   GFRNONAA >60 12/23/2013 0513   GFRAA >60 06/11/2019 0506   GFRAA >60 12/23/2013 0513    COAG Lab Results  Component Value Date   INR 1.0 03/15/2019   INR 1.1 12/23/2013   No results found for: PTT  Antibiotics Anti-infectives (From  admission, onward)   Start     Dose/Rate Route Frequency Ordered Stop   06/09/19 2345  clindamycin (CLEOCIN) IVPB 300 mg     300 mg 100 mL/hr over 30 Minutes Intravenous  Once 06/09/19 2331 06/10/19 1248       V. Leia Alf, M.D., [facility] [place] Office: [phone] Pager:  [phone]

## 2019-06-12 NOTE — Progress Notes (Signed)
Pt discharged per MD order. Discharge instructions reviewed with pt. Pt verbalized understanding. Pt asked RN to removed dressing from her groin. RN told pt she would have to contact the Dr for an order to do so. Pt said that the doctor told her that she could and she will once she leaves the hospital. RN paged and messaged Dr. Trula Slade but pt was unwilling to wait for a response. RN offered to call pt once the Dr. To responded to verify the dressing could be removed but pt declined and said she was going to removed it once she got in the car. Will report to Dr. Trula Slade. Pt wheeled to car in wheelchair by staff.

## 2019-06-12 NOTE — Discharge Summary (Signed)
Physician Discharge Summary  Patient ID: Gail Nelson MRN: [id number] DOB/AGE: 08-08-1923 86 y.o.  Admit date: 06/10/2019 Discharge date: 06/12/2019  Admission Diagnoses:  Discharge Diagnoses:  Active Problems:   Hematoma   Discharged Condition: fair  Hospital Course: 86 yo with hematoma following angio for endoleak.  She had a CTA that showed no pseudo.  She was monitored for 2 days.  Her Hb was stable  Consults: None  Significant Diagnostic Studies: angiography   Discharge Exam: Blood pressure (!) 92/56, pulse 89, temperature 97.8 F (36.6 C), temperature source Oral, resp. rate 19, height 5\' 2"  (1.575 m), weight 89.8 kg, SpO2 96 %. stable right groin hematoma  Disposition:    Allergies as of 06/12/2019      Reactions   Augmentin [amoxicillin-pot Clavulanate]    Clarithromycin Other (See Comments)   "Tongue gets cover with fuzz"   Oxycodone Other (See Comments)   Penicillins Swelling   Tramadol Other (See Comments)      Medication List    TAKE these medications   albuterol (2.5 MG/3ML) 0.083% nebulizer solution Commonly known as: PROVENTIL Take 3 mLs (2.5 mg total) by nebulization every 6 (six) hours as needed for wheezing or shortness of breath.   carvedilol 3.125 MG tablet Commonly known as: COREG Take 1 tablet (3.125 mg total) by mouth 2 (two) times daily. What changed: when to take this   chlorpheniramine-HYDROcodone 10-8 MG/5ML Suer Commonly known as: Tussionex Pennkinetic ER Take 5 mLs by mouth every 12 (twelve) hours as needed for cough.   cholecalciferol 1000 units tablet Commonly known as: VITAMIN D Take 1,000 Units by mouth daily.   clotrimazole-betamethasone cream Commonly known as: LOTRISONE Apply 1 application topically 2 (two) times daily.   fluticasone 50 MCG/ACT nasal spray Commonly known as: FLONASE Place 2 sprays into both nostrils daily.   Fluticasone-Salmeterol 250-50 MCG/DOSE Aepb Commonly known as: Advair Diskus Inhale 1  puff into the lungs 2 (two) times daily.   ibuprofen 200 MG tablet Commonly known as: ADVIL Take 200 mg by mouth every 6 (six) hours as needed.   KRILL OIL PO Take by mouth.   losartan 50 MG tablet Commonly known as: COZAAR TAKE 1 TABLET(50 MG) BY MOUTH DAILY   Manganese 10 MG Tabs Take 1 tablet by mouth daily.   OXYGEN Inhale into the lungs. 2 liters at night   potassium gluconate 595 (99 K) MG Tabs tablet Take 595 mg by mouth daily.   vitamin B-12 1000 MCG tablet Commonly known as: CYANOCOBALAMIN Take 1,000 mcg by mouth daily.   vitamin E 180 MG (400 UNITS) capsule Take 400 Units by mouth daily.        Signed: Wells Leeum Sankey 06/12/2019, 9:42 AM

## 2019-06-13 ENCOUNTER — Encounter: Payer: Self-pay | Admitting: *Deleted

## 2019-06-13 ENCOUNTER — Other Ambulatory Visit: Payer: Self-pay

## 2019-06-13 ENCOUNTER — Inpatient Hospital Stay: Payer: Medicare Other | Attending: Internal Medicine | Admitting: Internal Medicine

## 2019-06-13 ENCOUNTER — Encounter: Payer: Self-pay | Admitting: Cardiology

## 2019-06-13 ENCOUNTER — Ambulatory Visit: Payer: Medicare Other | Admitting: Nurse Practitioner

## 2019-06-13 ENCOUNTER — Ambulatory Visit
Admission: RE | Admit: 2019-06-13 | Discharge: 2019-06-13 | Disposition: A | Discharge status: Home / Self Care | Payer: Medicare Other | Source: Ambulatory Visit | Attending: Radiation Oncology | Admitting: Radiation Oncology

## 2019-06-13 ENCOUNTER — Telehealth: Payer: Self-pay | Admitting: Oncology

## 2019-06-13 DIAGNOSIS — Z7189 Other specified counseling: Secondary | ICD-10-CM

## 2019-06-13 DIAGNOSIS — Z87891 Personal history of nicotine dependence: Secondary | ICD-10-CM | POA: Diagnosis not present

## 2019-06-13 DIAGNOSIS — J449 Chronic obstructive pulmonary disease, unspecified: Secondary | ICD-10-CM | POA: Insufficient documentation

## 2019-06-13 DIAGNOSIS — J910 Malignant pleural effusion: Secondary | ICD-10-CM | POA: Insufficient documentation

## 2019-06-13 DIAGNOSIS — Z79899 Other long term (current) drug therapy: Secondary | ICD-10-CM | POA: Insufficient documentation

## 2019-06-13 DIAGNOSIS — C3411 Malignant neoplasm of upper lobe, right bronchus or lung: Secondary | ICD-10-CM

## 2019-06-13 DIAGNOSIS — C3431 Malignant neoplasm of lower lobe, right bronchus or lung: Secondary | ICD-10-CM | POA: Diagnosis not present

## 2019-06-13 NOTE — Progress Notes (Signed)
START ON PATHWAY REGIMEN - Non-Small Cell Lung     A cycle is 21 days:     Pembrolizumab   **Always confirm dose/schedule in your pharmacy ordering system**  Patient Characteristics: Stage IV Metastatic, Nonsquamous, Initial Chemotherapy/Immunotherapy, PS = 0, 1, ALK Rearrangement Negative and ROS1 Rearrangement Negative and NTRK Gene Fusion?Negative and RET Gene Fusion?Negative and EGFR Mutation Negative/Non?Sensitizing, PD-L1  Expression Positive  ? 50% (TPS) and Immunotherapy Candidate Therapeutic Status: Stage IV Metastatic Histology: Nonsquamous Cell ROS1 Rearrangement Status: Negative Other Mutations/Biomarkers: No Other Actionable Mutations NTRK Gene Fusion Status: Negative PD-L1 Expression Status: PD-L1 Positive ? 50% (TPS) Chemotherapy/Immunotherapy LOT: Initial Chemotherapy/Immunotherapy Molecular Targeted Therapy: Not Appropriate MET Exon 14 Mutation Status: Negative RET Gene Fusion Status: Negative ALK Rearrangement Status: Negative EGFR Mutation Status: Non-Sensitizing BRAF V600E Mutation Status: Negative ECOG Performance Status: 1 Biomarker Assessment Status Confirmation: All Genomic Markers Negative or Only MET+ or BRAF+ Immunotherapy Candidate Status: Candidate for Immunotherapy Intent of Therapy: Non-Curative / Palliative Intent, Discussed with Patient

## 2019-06-13 NOTE — Progress Notes (Signed)
  Oncology Nurse Navigator Documentation  Navigator Location: [facility] (06/13/19 1400)   )Navigator Encounter Type: Follow-up Appt (06/13/19 1400)                     Patient Visit Type: MedOnc;RadOnc (06/13/19 1400) Treatment Phase: Pre-Tx/Tx Discussion (06/13/19 1400) Barriers/Navigation Needs: Coordination of Care;Education (06/13/19 1400) Education: Understanding Cancer/ Treatment Options (06/13/19 1400) Interventions: Coordination of Care;Education (06/13/19 1400)   Coordination of Care: Appts(Immunotherapy) (06/13/19 1400) Education Method: Verbal;Written (06/13/19 1400)       met with patient and her daughter during follow up visit with Dr. Rogue Bussing and Dr. Baruch Gouty to review results and discuss treatment options. All questions answered during visit. Pt and her daughter given education resources regarding immunotherapy. Reviewed upcoming appts. Pt and daughter concerned about fluid re-accumulating and what to do if it does. Pt states that pain was her presenting symptom for worsening pleural effusion. Instructed to let us know when pain starts to worsen so we can evaluate patient to see if needs fluid to be drained again.  Pt and daughter verbalized understanding. Instructed to call with any further questions or needs. Understanding verbalized.          Time Spent with Patient: 45 (06/13/19 1400)

## 2019-06-13 NOTE — Assessment & Plan Note (Addendum)
#  Metastatic/stage IV lung cancer [cytology pleural effusion ;favor adeno]; Jun 09, 2019-improvement of the right lower lobe mass status post radiation; however new enlarged precarinal lymph node-suspicious for involvement by disease.  I reviewed the pathology/staging PET scan with the patient in detail.  #I discussed the treatment options including single agent immunotherapy versus chemoimmunotherapy.  Patient a poor candidate for chemoimmunotherapy; given PDL 85% single agent Keytruda would be reasonable.   I discussed the mechanism of action; The goal of therapy is palliative; and length of treatments are likely ongoing/based upon the results of the scans. Discussed the potential side effects of immunotherapy including but not limited to diarrhea; skin rash; elevated LFTs/endocrine abnormalities etc. patient agrees to proceed with treatment.  #Right pleural effusion-malignant; status post thoracentesis-improvement of pain.  # COPD-stable.  Recommend continue inhalers.  #Endovascular stent leak-incidentally noted on CT scan; s/p evaluation with vascular surgery; repair of the leak.  #Advanced malignancy/anxiety pain-recommend evaluation with palliative care.  Discussed with Gail Nelson.  # DISPOSITION: # Chemo classBeryle Nelson # 1 week; MD [afteroon-pt pref]; labs- cbc/cmp/TSH; Keytruda- # 1 week-Gail Nelson- Dr.B  Cc; Dr.Saadat Khan/Dr.Dew.  # I reviewed the blood work- with the patient in detail; also reviewed the imaging independently [as summarized above]; and with the patient in detail.

## 2019-06-13 NOTE — Progress Notes (Signed)
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 Persistent hypermetabolism noted in the right lower lobe mass with SUV max = 5.8 today compared to 11.3 previously. Lesion has decreased in size since previous PET-CT measuring 2.9 x 1.2 cm today, not substantially changed since chest CT of 06/01/2019. Right hilar hypermetabolism seen on the previous study has resolved although there is a 11 mm precarinal node (image 67/3) demonstrating low level hypermetabolism with SUV max = 3.3. Incidental CT findings: Small to moderate right pleural effusion noted. There is right lower lobe collapse/consolidation with subsegmental atelectasis in the right middle and left lower lobes. ABDOMEN/PELVIS: No abnormal hypermetabolic activity within the liver, pancreas, adrenal glands, or spleen. No hypermetabolic lymph nodes in the abdomen or pelvis. 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 Prominent cyst noted lower pole left kidney. Left-sided diverticulosis without diverticulitis. SKELETON: No focal hypermetabolic activity to suggest skeletal metastasis. Incidental CT findings: none IMPRESSION: 1.  Decreased hypermetabolism and decreased size of the right lower lobe pulmonary mass comparing to prior PET-CT. 2. The hypermetabolic right infrahilar metastatic disease seen on the previous PET-CT has resolved but there is a new mildly enlarged precarinal node showing low level hypermetabolism on today's study concerning for metastatic involvement. 3.  Aortic Atherosclerois (ICD10-170.0) Electronically Signed   By: Misty Stanley M.D.   On: 06/09/2019 12:04   US THORACENTESIS ASP PLEURAL SPACE W/IMG GUIDE  Result Date: 06/03/2019 INDICATION: Patient with history of adenocarcinoma of the lung and breast cancer status post left mastectomy presents for therapeutic and diagnostic thoracentesis EXAM: ULTRASOUND GUIDED THERAPEUTIC AND DIAGNOSTIC THORACENTESIS MEDICATIONS: Lidocaine 1% 10 mL COMPLICATIONS: None immediate. PROCEDURE: An ultrasound guided thoracentesis was thoroughly discussed with the patient and questions answered. The benefits, risks, alternatives and complications were also discussed. The patient  understands and wishes to proceed with the procedure. Written consent was obtained. Ultrasound was performed to localize and mark an adequate pocket of fluid in the right sided chest. The area was then prepped and draped in the normal sterile fashion. 1% Lidocaine was used for local anesthesia. Under ultrasound guidance a 6 Fr Safe-T-Centesis catheter was introduced. Thoracentesis was performed. The catheter was removed and a dressing applied. FINDINGS: A total of approximately 900 mL of serosanguineous fluid was removed. Samples were sent to the laboratory as requested by the clinical team. IMPRESSION: Successful ultrasound guided therapeutic and diagnostic right sided thoracentesis yielding 900 mL of pleural fluid. Read by Rushie Nyhan NP Electronically Signed   By: Corrie Mckusick D.O.   On: 06/03/2019 11:04    ASSESSMENT & PLAN:   Primary malignant neoplasm of right lower lobe of lung  ([place]) #Metastatic/stage IV lung cancer [cytology pleural effusion ;favor adeno]; Jun 09, 2019-improvement of the right lower lobe mass status post radiation; however new enlarged precarinal lymph node-suspicious for involvement by disease.  I reviewed the pathology/staging PET scan with the patient in detail.  #I discussed the treatment options including single agent immunotherapy versus chemoimmunotherapy.  Patient a poor candidate for chemoimmunotherapy; given PDL 85% single agent Keytruda would be reasonable.   I discussed the mechanism of action; The goal of therapy is palliative; and length of treatments are likely ongoing/based upon the results of the scans. Discussed the potential side effects of immunotherapy including but not limited to diarrhea; skin rash; elevated LFTs/endocrine abnormalities etc. patient agrees to proceed with treatment.  #Right pleural effusion-malignant; status post thoracentesis-improvement of pain.  # COPD-stable.  Recommend continue inhalers.  #Endovascular stent leak-incidentally noted on CT scan; s/p evaluation with vascular surgery; repair of the leak.  #Advanced malignancy/anxiety pain-recommend evaluation with palliative care.  Discussed with Josh.  # DISPOSITION: # Chemo classBeryle Flock # 1 week; MD [afteroon-pt pref]; labs- cbc/cmp/TSH; Keytruda- # 1 week-Josh- Dr.B  Cc; Dr.Saadat Khan/Dr.Dew.  # I reviewed the blood work- with the patient in detail; also reviewed the imaging independently [as summarized above]; and with the patient in detail.   All questions were answered. The patient knows to call the clinic with any problems, questions or concerns.    Cammie Sickle, MD 06/19/2019 7:45 PM

## 2019-06-13 NOTE — Progress Notes (Signed)
Radiation Oncology Follow up Note  Name: Gail Nelson   Date:   06/13/2019 MRN:  [id number] DOB: 11-19-1923    This 86 y.o. female presents to the clinic today for follow-up status post hypofractionated course of radiation therapy to her right lung for non-small cell lung cancer favoring adenocarcinoma.Marland Kitchen  REFERRING PROVIDER: Lavera Guise, MD  HPI: Patient is a 86 year old female now at approximately 4 months having completed a hypofractionated course of radiation therapy to her right lung for non-small cell lung cancer I saw her she was complaining of increasing abdominal distention and pain and had a history of AAA.  She was noted on repeat CT scan to have a significant right-sided pleural effusion and she did have thoracentesis on that.  She also was had suspicion of a type II endoleak of her 6 cm abdominal aortic aneurysm which Dr. Lucky Cowboy has repaired recently.  She is currently seeing Dr. B and is scheduled to start [facility].  She is doing well today abdominal pain has improved..  She is still having some shortness of breath.  COMPLICATIONS OF TREATMENT: none  FOLLOW UP COMPLIANCE: keeps appointments   PHYSICAL EXAM:  There were no vitals taken for this visit. Wheelchair-bound female elderly in NAD.  She does have some decreased breath sounds in the right lower lobe.  Well-developed well-nourished patient in NAD. HEENT reveals PERLA, EOMI, discs not visualized.  Oral cavity is clear. No oral mucosal lesions are identified. Neck is clear without evidence of cervical or supraclavicular adenopathy. Lungs are clear to A&P. Cardiac examination is essentially unremarkable with regular rate and rhythm without murmur rub or thrill. Abdomen is benign with no organomegaly or masses noted. Motor sensory and DTR levels are equal and symmetric in the upper and lower extremities. Cranial nerves II through XII are grossly intact. Proprioception is intact. No peripheral adenopathy or edema is identified. No  motor or sensory levels are noted. Crude visual fields are within normal range.  RADIOLOGY RESULTS: PET scan CT scans reviewed compatible with above-stated findings  PLAN: Present time patient will start immunotherapy with Keytruda under Dr. Sharmaine Base direction.  At this time I see no role for palliative radiation therapy.  I will see her back in 3 to 4 months for follow-up.  Patient knows to call with any concerns at any time.  I would like to take this opportunity to thank you for allowing me to participate in the care of your patient.Noreene Filbert, MD

## 2019-06-13 NOTE — Patient Instructions (Signed)
Pembrolizumab injection What is this medicine? PEMBROLIZUMAB (pem broe liz ue mab) is a monoclonal antibody. It is used to treat certain types of cancer. This medicine may be used for other purposes; ask your health care provider or pharmacist if you have questions. COMMON BRAND NAME(S): Keytruda What should I tell my health care provider before I take this medicine? They need to know if you have any of these conditions:  diabetes  immune system problems  inflammatory bowel disease  liver disease  lung or breathing disease  lupus  received or scheduled to receive an organ transplant or a stem-cell transplant that uses donor stem cells  an unusual or allergic reaction to pembrolizumab, other medicines, foods, dyes, or preservatives  pregnant or trying to get pregnant  breast-feeding How should I use this medicine? This medicine is for infusion into a vein. It is given by a health care professional in a hospital or clinic setting. A special MedGuide will be given to you before each treatment. Be sure to read this information carefully each time. Talk to your pediatrician regarding the use of this medicine in children. While this drug may be prescribed for children as young as 6 months for selected conditions, precautions do apply. Overdosage: If you think you have taken too much of this medicine contact a poison control center or emergency room at once. NOTE: This medicine is only for you. Do not share this medicine with others. What if I miss a dose? It is important not to miss your dose. Call your doctor or health care professional if you are unable to keep an appointment. What may interact with this medicine? Interactions have not been studied. Give your health care provider a list of all the medicines, herbs, non-prescription drugs, or dietary supplements you use. Also tell them if you smoke, drink alcohol, or use illegal drugs. Some items may interact with your medicine. This  list may not describe all possible interactions. Give your health care provider a list of all the medicines, herbs, non-prescription drugs, or dietary supplements you use. Also tell them if you smoke, drink alcohol, or use illegal drugs. Some items may interact with your medicine. What should I watch for while using this medicine? Your condition will be monitored carefully while you are receiving this medicine. You may need blood work done while you are taking this medicine. Do not become pregnant while taking this medicine or for 4 months after stopping it. Women should inform their doctor if they wish to become pregnant or think they might be pregnant. There is a potential for serious side effects to an unborn child. Talk to your health care professional or pharmacist for more information. Do not breast-feed an infant while taking this medicine or for 4 months after the last dose. What side effects may I notice from receiving this medicine? Side effects that you should report to your doctor or health care professional as soon as possible:  allergic reactions like skin rash, itching or hives, swelling of the face, lips, or tongue  bloody or black, tarry  breathing problems  changes in vision  chest pain  chills  confusion  constipation  cough  diarrhea  dizziness or feeling faint or lightheaded  fast or irregular heartbeat  fever  flushing  joint pain  low blood counts - this medicine may decrease the number of white blood cells, red blood cells and platelets. You may be at increased risk for infections and bleeding.  muscle pain  muscle   weakness  pain, tingling, numbness in the hands or feet  persistent headache  redness, blistering, peeling or loosening of the skin, including inside the mouth  signs and symptoms of high blood sugar such as dizziness; dry mouth; dry skin; fruity breath; nausea; stomach pain; increased hunger or thirst; increased urination  signs  and symptoms of kidney injury like trouble passing urine or change in the amount of urine  signs and symptoms of liver injury like dark urine, light-colored stools, loss of appetite, nausea, right upper belly pain, yellowing of the eyes or skin  sweating  swollen lymph nodes  weight loss Side effects that usually do not require medical attention (report to your doctor or health care professional if they continue or are bothersome):  decreased appetite  hair loss  muscle pain  tiredness This list may not describe all possible side effects. Call your doctor for medical advice about side effects. You may report side effects to FDA at 1-800-FDA-1088. Where should I keep my medicine? This drug is given in a hospital or clinic and will not be stored at home. NOTE: This sheet is a summary. It may not cover all possible information. If you have questions about this medicine, talk to your doctor, pharmacist, or health care provider.  2020 [employer]/Gold Standard (2018-11-26 18:07:58)  

## 2019-06-14 ENCOUNTER — Inpatient Hospital Stay: Payer: Medicare Other

## 2019-06-14 ENCOUNTER — Inpatient Hospital Stay (HOSPITAL_BASED_OUTPATIENT_CLINIC_OR_DEPARTMENT_OTHER): Payer: Medicare Other | Admitting: Oncology

## 2019-06-14 ENCOUNTER — Other Ambulatory Visit: Payer: Self-pay

## 2019-06-14 ENCOUNTER — Inpatient Hospital Stay: Payer: Medicare Other | Admitting: Hospice and Palliative Medicine

## 2019-06-14 DIAGNOSIS — C3431 Malignant neoplasm of lower lobe, right bronchus or lung: Secondary | ICD-10-CM
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
## 2019-06-15 ENCOUNTER — Other Ambulatory Visit: Payer: Medicare Other

## 2019-06-16 ENCOUNTER — Telehealth: Payer: Self-pay | Admitting: Internal Medicine

## 2019-06-17 ENCOUNTER — Other Ambulatory Visit: Payer: Self-pay | Admitting: Nurse Practitioner

## 2019-06-17 ENCOUNTER — Telehealth: Payer: Self-pay

## 2019-06-17 ENCOUNTER — Ambulatory Visit: Payer: Medicare Other | Admitting: Nurse Practitioner

## 2019-06-17 ENCOUNTER — Telehealth: Payer: Self-pay | Admitting: Oncology

## 2019-06-17 DIAGNOSIS — M79604 Pain in right leg: Secondary | ICD-10-CM

## 2019-06-17 DIAGNOSIS — M79605 Pain in left leg: Secondary | ICD-10-CM

## 2019-06-17 MED ORDER — HYDROCODONE-ACETAMINOPHEN 5-325 MG PO TABS: 1.0000 | ORAL_TABLET | Freq: Four times a day (QID) | ORAL | 0 refills | Status: DC | PRN

## 2019-06-17 NOTE — Telephone Encounter (Signed)
Spoke with daughter that we send if she cannot tolerate pain or getting worse  need to go to ED asap

## 2019-06-17 NOTE — Telephone Encounter (Signed)
Due to oncology and dr Lucky Cowboy office not contact pt yet as per dr Humphrey Rolls we going send oxycodone to go through weekend

## 2019-06-17 NOTE — Telephone Encounter (Signed)
Re: Right Leg Pain  Spoke with Dr. Lucky Cowboy who recommends evaluation for DVT given active malignancy.  He explained that there were not many options for a resolving hematoma except for evaluation for skin breakdown.  Let patient know and advised if pain is persistent and worse that emergency room assessment would be warranted.   Faythe Casa, NP 06/17/2019 3:57 PM

## 2019-06-17 NOTE — Telephone Encounter (Signed)
Pt is allergic to oxycodone

## 2019-06-17 NOTE — Telephone Encounter (Signed)
Due to c/o severe pain in lower legs with swelling, sent prescription for hydrocodone/APAP 5/325mg  tablet which may be taken up to three times per day as needed for pain. Prescription approved per Dr. Argie Ramming prior to sending.

## 2019-06-17 NOTE — Telephone Encounter (Signed)
Re: Right Leg pain and swelling  Call patient to discuss upcoming immunotherapy and to make sure all of her questions were answered during her chemo education this week and patient appears to be in a lot of pain.  She states that beginning today she has developed right leg swelling and pain that is severe 10 out of 10 pain.  Upon further review, patient recently had leaky aortic aneurysm repair and developed a hematoma in the right groin.  She was monitored for several days with imaging which did not show any active extravasation or ongoing bleeding.  Vital signs and blood counts were stable at discharge.  Pressure dressing in place.   Patient is asking for pain medicine.  I do not feel comfortable giving her pain medicine at this time.  I recommend she be seen in the emergency room for further evaluation.   I asked if I was able to call her daughter to address my concerns and she told me "No".   I reiterated the importance of evaluation of the right leg and she said she would "deal with it".    Faythe Casa, NP 06/17/2019 3:37 PM

## 2019-06-17 NOTE — Progress Notes (Signed)
Due to c/o severe pain in lower legs with swelling, sent prescription for hydrocodone/APAP 5/325mg  tablet which may be taken up to three times per day as needed for pain. Prescription approved per Dr. Argie Ramming prior to sending.

## 2019-06-17 NOTE — Telephone Encounter (Signed)
Spoke with pt daughter she tolerate hydrocodone  And advised that we send hydrocodone

## 2019-06-17 NOTE — Telephone Encounter (Signed)
Pt called that she having pain after radiation and swelling we discuss with heather she pt need call oncology

## 2019-06-20 ENCOUNTER — Inpatient Hospital Stay (HOSPITAL_BASED_OUTPATIENT_CLINIC_OR_DEPARTMENT_OTHER): Payer: Medicare Other | Admitting: Hospice and Palliative Medicine

## 2019-06-20 ENCOUNTER — Inpatient Hospital Stay: Payer: Medicare Other | Admitting: Internal Medicine

## 2019-06-20 ENCOUNTER — Other Ambulatory Visit: Payer: Self-pay

## 2019-06-20 ENCOUNTER — Inpatient Hospital Stay: Payer: Medicare Other

## 2019-06-20 ENCOUNTER — Encounter: Payer: Medicare Other | Admitting: Hospice and Palliative Medicine

## 2019-06-20 DIAGNOSIS — Z515 Encounter for palliative care: Secondary | ICD-10-CM | POA: Diagnosis not present

## 2019-06-20 DIAGNOSIS — C3431 Malignant neoplasm of lower lobe, right bronchus or lung: Secondary | ICD-10-CM

## 2019-06-20 DIAGNOSIS — J910 Malignant pleural effusion: Secondary | ICD-10-CM | POA: Diagnosis not present

## 2019-06-20 DIAGNOSIS — J449 Chronic obstructive pulmonary disease, unspecified: Secondary | ICD-10-CM | POA: Diagnosis not present

## 2019-06-20 DIAGNOSIS — Z79899 Other long term (current) drug therapy: Secondary | ICD-10-CM | POA: Diagnosis not present

## 2019-06-20 DIAGNOSIS — Z87891 Personal history of nicotine dependence: Secondary | ICD-10-CM | POA: Diagnosis not present

## 2019-06-20 LAB — COMPREHENSIVE METABOLIC PANEL
ALT: 8 U/L (ref 0–44)
AST: 18 U/L (ref 15–41)
Albumin: 3.6 g/dL (ref 3.5–5.0)
Alkaline Phosphatase: 64 U/L (ref 38–126)
Anion gap: 10 (ref 5–15)
BUN: 15 mg/dL (ref 8–23)
CO2: 30 mmol/L (ref 22–32)
Calcium: 9 mg/dL (ref 8.9–10.3)
Chloride: 96 mmol/L — ABNORMAL LOW (ref 98–111)
Creatinine, Ser: 0.75 mg/dL (ref 0.44–1.00)
GFR calc Af Amer: >60 mL/min (ref >60)
GFR calc non Af Amer: >60 mL/min (ref >60)
Glucose, Bld: 119 mg/dL — ABNORMAL HIGH (ref 70–99)
Potassium: 4.4 mmol/L (ref 3.5–5.1)
Sodium: 136 mmol/L (ref 135–145)
Total Bilirubin: 1.6 mg/dL — ABNORMAL HIGH (ref 0.3–1.2)
Total Protein: 6.8 g/dL (ref 6.5–8.1)

## 2019-06-20 LAB — CBC WITH DIFFERENTIAL/PLATELET
Abs Immature Granulocytes: 0.12 10*3/uL — ABNORMAL HIGH (ref 0.00–0.07)
Basophils Absolute: 0 10*3/uL (ref 0.0–0.1)
Basophils Relative: 0 %
Eosinophils Absolute: 0.2 10*3/uL (ref 0.0–0.5)
Eosinophils Relative: 2 %
HCT: 33.7 % — ABNORMAL LOW (ref 36.0–46.0)
Hemoglobin: 10.7 g/dL — ABNORMAL LOW (ref 12.0–15.0)
Immature Granulocytes: 2 %
Lymphocytes Relative: 8 %
Lymphs Abs: 0.6 10*3/uL — ABNORMAL LOW (ref 0.7–4.0)
MCH: 29 pg (ref 26.0–34.0)
MCHC: 31.8 g/dL (ref 30.0–36.0)
MCV: 91.3 fL (ref 80.0–100.0)
Monocytes Absolute: 0.6 10*3/uL (ref 0.1–1.0)
Monocytes Relative: 8 %
Neutro Abs: 6.1 10*3/uL (ref 1.7–7.7)
Neutrophils Relative %: 80 %
Platelets: 196 10*3/uL (ref 150–400)
RBC: 3.69 MIL/uL — ABNORMAL LOW (ref 3.87–5.11)
RDW: 16.1 % — ABNORMAL HIGH (ref 11.5–15.5)
WBC: 7.7 10*3/uL (ref 4.0–10.5)
nRBC: 0 % (ref 0.0–0.2)

## 2019-06-20 LAB — TSH: TSH: 13.843 u[IU]/mL — ABNORMAL HIGH (ref 0.350–4.500)

## 2019-06-20 NOTE — Progress Notes (Signed)
Patient has extensive ecchymyosis on right leg s/p endovascular stent surgery. Pitting edema present on right leg. Bruising from right groin to right ankle present. Patient states that she is "dissatisfied" with her post surgical care after her vascular procedure. Pain in right leg. She was "unable to reach Dr. Bunnie Domino office this weekend." She states that she "called twice on Friday" and she was "on hold for a while with automatic msg regarding the practice. No one picked up". Pt states that she did not leave any voice msgs for staff to return her phone call.    Patient spoke to Cisco, NP at cancer center last Friday, who advised her to go to the ER. Patient refused to go to ER at that time.  Due to pain she contacted her pcp this weekend and received a Norco prescription, which has "not been effective in pain control."  Pt reports Constipation. Last bm on 5/14. Before this bm date, she had not had a bm (per patient) in a over a week. She is not using any laxatives. However, she reports that she is increasing her 'greens' to help with constipation. Patient education provided regarding constipation prevention. Patient was advised to avoid straining with BM. Constipation interventions discussed in length with patient including miralax, stool softners and prune juice and increase in fiber/water intake.

## 2019-06-20 NOTE — Telephone Encounter (Signed)
Per dr Humphrey Rolls medication for pain was called in. Beth

## 2019-06-20 NOTE — Progress Notes (Signed)
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 benefits, risks, alternatives and complications were also discussed. The patient understands and wishes to proceed with the procedure. Written consent was obtained. Ultrasound was performed to localize and mark an adequate pocket of fluid in the right sided chest. The area was then prepped and draped in the normal sterile fashion. 1% Lidocaine was used for local anesthesia. Under ultrasound guidance a 6 Fr Safe-T-Centesis catheter was introduced. Thoracentesis was performed. The catheter was removed and a dressing applied. FINDINGS: A total of approximately 900 mL of serosanguineous fluid was removed. Samples were sent to the laboratory as requested by the clinical team. IMPRESSION: Successful ultrasound guided therapeutic and diagnostic right sided thoracentesis yielding 900 mL of pleural fluid. Read by Rushie Nyhan NP Electronically Signed   By: Corrie Mckusick D.O.   On: 06/03/2019 11:04    PERFORMANCE STATUS (ECOG) : 1 - Symptomatic but completely ambulatory  Review of Systems Unless otherwise noted, a complete review of systems is negative.  Physical Exam General: NAD Pulmonary: Unlabored GU: no suprapubic  tenderness Extremities: RLE edema with ecchymosis, groin is soft without evidence of hematoma, positive capillary refill, pulses intact Skin: no rashes Neurological: Weakness but otherwise nonfocal  IMPRESSION: Routine follow-up visit today in the clinic.  Patient was accompanied by her daughter.  Patient was hospitalized 06/10/2019 to 06/12/2019 with a hematoma following angiography for endoleak.  Patient says that her right groin is now soft whereas it was previously large, swollen, and hard.  She has had development of distal ecchymosis.  Dr. Rogue Bussing spoke with Dr. Lucky Cowboy today and patient to monitor for changes.  Will likely take a couple weeks prior to improvement in ecchymosis.  Patient says that her pain in the right leg is being adequately managed with use of as needed Norco, which was prescribed by her PCP.  She denies any adverse issues with pain medications although she states that she does not want to take them long-term.  No other significant changes or concerns were reported.  PLAN: -Continue current scope of treatment -Norco as needed for pain -ACP/MOST form to be readdressed -RTC in about 3 weeks  Case and plan discussed with Dr. Rogue Bussing   Patient expressed understanding and was in agreement with this plan. She also understands that She can call the clinic at any time with any questions, concerns, or complaints.     Time Total: 20 minutes  Visit consisted of counseling and education dealing with the complex and emotionally intense issues of symptom management and palliative care in the setting of serious and potentially life-threatening illness.Greater than 50%  of this time was spent counseling and coordinating care related to the above assessment and plan.  Signed by: Altha Harm, PhD, NP-C

## 2019-06-20 NOTE — Progress Notes (Signed)
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 and used for attenuation correction and anatomic localization. Fasting blood glucose: 104 mg/dl COMPARISON:  Chest CT 06/01/2019.  PET-CT 02/28/2019. FINDINGS: Mediastinal blood pool activity: SUV max 3.0 Liver activity: SUV max NA NECK: No hypermetabolic lymph nodes in the neck. Incidental CT findings: none CHEST: Persistent hypermetabolism noted in the right lower lobe mass with SUV max = 5.8 today compared to 11.3 previously. Lesion has decreased in size since previous PET-CT measuring 2.9 x 1.2 cm today, not substantially changed since chest CT of 06/01/2019. Right hilar hypermetabolism seen on the previous study has resolved although there is a 11 mm precarinal node (image 67/3) demonstrating low level hypermetabolism with SUV max = 3.3. Incidental CT findings: Small to moderate right pleural effusion noted. There is right lower lobe collapse/consolidation with subsegmental atelectasis in the right middle and left lower lobes. ABDOMEN/PELVIS: No abnormal hypermetabolic activity within the liver, pancreas, adrenal glands, or spleen. No hypermetabolic lymph nodes in the abdomen or pelvis.  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 Prominent cyst noted lower pole left kidney. Left-sided diverticulosis without diverticulitis. SKELETON: No focal hypermetabolic activity to suggest skeletal metastasis. Incidental CT findings: none IMPRESSION: 1. Decreased hypermetabolism and decreased size of the right lower lobe pulmonary mass comparing to prior PET-CT. 2. The hypermetabolic right infrahilar metastatic disease seen on the previous PET-CT has resolved but there is a new mildly enlarged precarinal node showing low level hypermetabolism on today's study concerning for metastatic involvement. 3.  Aortic Atherosclerois (ICD10-170.0)  Electronically Signed   By: Misty Stanley M.D.   On: 06/09/2019 12:04   US THORACENTESIS ASP PLEURAL SPACE W/IMG GUIDE  Result Date: 06/03/2019 INDICATION: Patient with history of adenocarcinoma of the lung and breast cancer status post left mastectomy presents for therapeutic and diagnostic thoracentesis EXAM: ULTRASOUND GUIDED THERAPEUTIC AND DIAGNOSTIC THORACENTESIS MEDICATIONS: Lidocaine 1% 10 mL COMPLICATIONS: None immediate. PROCEDURE: An ultrasound guided thoracentesis was thoroughly discussed with the patient and questions answered. The benefits, risks, alternatives and complications were also discussed. The patient understands and wishes to proceed with the procedure. Written consent was obtained. Ultrasound was performed to localize and mark an adequate pocket of fluid in the right sided chest. The area was then prepped and draped in the normal sterile fashion. 1% Lidocaine was used for local anesthesia. Under ultrasound guidance a 6 Fr Safe-T-Centesis catheter was introduced. Thoracentesis was performed. The catheter was removed and a dressing applied. FINDINGS: A total of approximately 900 mL of serosanguineous fluid was removed. Samples were sent to the laboratory as  requested by the clinical team. IMPRESSION: Successful ultrasound guided therapeutic and diagnostic right sided thoracentesis yielding 900 mL of pleural fluid. Read by Rushie Nyhan NP Electronically Signed   By: Corrie Mckusick D.O.   On: 06/03/2019 11:04    ASSESSMENT & PLAN:   Primary malignant neoplasm of right lower lobe of lung ([place]) #Metastatic/stage IV lung cancer [cytology pleural effusion; favor adeno]; Jun 09, 2019-improvement of the right lower lobe mass status post radiation; however new enlarged precarinal lymph node.  #Plan Keytruda single agent; however hold treatment today because of acute issues [see below].  # Right lower extremity bruising/ecchymosis acute pain-post aneurysm repair [see images above].  Contacted Dr. Dew/office regarding further plan of care.  Drop in hemoglobin about 1 g [11.7; currently 10.7].  Do not suspect any acute/active bleeding.  #Right pleural effusion-malignant; status post thoracentesis-improvement of pain.  Stable  #Advanced malignancy/anxiety pain-status post evaluation with palliative care.  Discussed with Gail Nelson.  # DISPOSITION: # No treatment today # follow up in 3 weeks; MD;labs- cbc/cmp; Keytruda Dr.B  Addendum: Spoke with Dr. Lucky Cowboy; feels unfortunately but likely complication of the procedure; it would take up to a few weeks to improve.  Inform the patient of Dr. Bunnie Domino recommendations;'s office will call regarding a follow-up appointment this week.  Continue hydrocodone for now/leg elevation/icing.  Cc; Dr.Saadat Khan/Dr.Dew.  All questions were answered. The patient knows to call the clinic with any problems, questions or concerns.    Cammie Sickle, MD 06/20/2019 12:33 PM

## 2019-06-20 NOTE — Addendum Note (Signed)
Addended by: Faythe Casa E on: 06/20/2019 04:06 PM   Modules accepted: Level of Service

## 2019-06-20 NOTE — Assessment & Plan Note (Addendum)
#  Metastatic/stage IV lung cancer [cytology pleural effusion; favor adeno]; Jun 09, 2019-improvement of the right lower lobe mass status post radiation; however new enlarged precarinal lymph node.  #Plan Keytruda single agent; however hold treatment today because of acute issues [see below].  # Right lower extremity bruising/ecchymosis acute pain-post aneurysm repair [see images above].  Contacted Dr. Dew/office regarding further plan of care.  Drop in hemoglobin about 1 g [11.7; currently 10.7].  Do not suspect any acute/active bleeding.  #Right pleural effusion-malignant; status post thoracentesis-improvement of pain.  Stable  #Advanced malignancy/anxiety pain-status post evaluation with palliative care.  Discussed with Josh.  # DISPOSITION: # No treatment today # follow up in 3 weeks; MD;labs- cbc/cmp; Keytruda Dr.B  Addendum: Spoke with Dr. Lucky Cowboy; feels unfortunately but likely complication of the procedure; it would take up to a few weeks to improve.  Inform the patient of Dr. Bunnie Domino recommendations;'s office will call regarding a follow-up appointment this week.  Continue hydrocodone for now/leg elevation/icing.  Cc; Dr.Saadat Khan/Dr.Dew.

## 2019-06-21 ENCOUNTER — Ambulatory Visit (INDEPENDENT_AMBULATORY_CARE_PROVIDER_SITE_OTHER): Payer: Medicare Other | Admitting: Vascular Surgery

## 2019-06-21 ENCOUNTER — Encounter (INDEPENDENT_AMBULATORY_CARE_PROVIDER_SITE_OTHER): Payer: Self-pay | Admitting: Vascular Surgery

## 2019-06-21 VITALS — BP 119/65 | HR 102 | Resp 16 | Wt 201.4 lb

## 2019-06-21 DIAGNOSIS — R600 Localized edema: Secondary | ICD-10-CM

## 2019-06-21 DIAGNOSIS — I714 Abdominal aortic aneurysm, without rupture, unspecified: Secondary | ICD-10-CM

## 2019-06-21 DIAGNOSIS — I10 Essential (primary) hypertension: Secondary | ICD-10-CM

## 2019-06-21 DIAGNOSIS — D381 Neoplasm of uncertain behavior of trachea, bronchus and lung: Secondary | ICD-10-CM

## 2019-06-21 NOTE — Assessment & Plan Note (Signed)
Underwent extensive embolization of several branches feeding the type II endoleak.  Procedure did have a hematoma and this has complicated things and made her course much more slow.  She understands this is going to take several weeks to resolve the hematoma in the right groin but with elevation and the use of the lymphedema pump that will speed things up.  Increasing her activity would be of benefit that is difficult with all of her many ongoing issues.  We will plan to see her back in a few weeks or sooner if problems develop in the interim

## 2019-06-21 NOTE — Progress Notes (Signed)
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 as before. Post radiation changes along the LEFT chest wall in the setting of prior LEFT mastectomy. Airways are patent. Musculoskeletal: Signs of  previous LEFT mastectomy. No chest wall mass. See below  for full musculoskeletal details. CT ABDOMEN PELVIS FINDINGS Hepatobiliary: Liver without focal, suspicious lesion. Post cholecystectomy baseline of biliary ductal distension similar to prior studies. Pancreas: Normal without ductal dilation or inflammation. Spleen: Spleen normal size without focal lesion. Adrenals/Urinary Tract: Adrenal glands are normal. Small low-density lesions in the kidneys likely cysts. No suspicious renal lesion or hydronephrosis. Stomach/Bowel: No acute gastrointestinal process. Colonic diverticulosis. Appendix not visualized. No secondary signs of appendicitis. Vascular/Lymphatic: Large endo sac with signs of type 2 endoleak with peripheral increased density showing increase in density from the venous phase to the delay within the endo sac. Caliber approximately 6.5 x 6.5 cm this measured approximately 6.3 x 6.4 cm on the study of February 28, 2019. No adenopathy in the retroperitoneum. No pelvic lymphadenopathy. Reproductive: Post hysterectomy. Urinary bladder is decompressed, limiting assessment. Other: No abdominal ascites. No peritoneal nodularity. Musculoskeletal: No acute bone finding. No destructive bone process. Spinal degenerative changes. IMPRESSION: 1. Spiculated mass along the posterior RIGHT lower lobe is obscured by volume loss and pleural fluid. 2. No adenopathy or metastatic disease. 3. Moderate size RIGHT pleural effusion. 4. Endosac following aorto bi-iliac endograft placement greater than 6 cm (6.5 x 6.5 cm) with suspicion for type 2 endoleak. Vascular surgery assessment may be warranted, CT angiography could be performed as clinically indicated. 5. Cardiomegaly with signs of mitral annular calcification. 6. Dilated central pulmonary arteries, can be seen in the setting of pulmonary arterial hypertension. 7. Colonic diverticulosis without evidence of diverticulitis. 8. Scarring about the LEFT axilla following LEFT  mastectomy and axillary dissection. 9. Emphysema and aortic atherosclerosis. These results will be called to the ordering clinician or representative by the Radiologist Assistant, and communication documented in the PACS or [employer]. Aortic Atherosclerosis (ICD10-I70.0) and Emphysema (ICD10-J43.9). Electronically Signed   By: Zetta Bills M.D.   On: 06/01/2019 16:07   CT ABDOMEN PELVIS W CONTRAST  Result Date: 06/01/2019 CLINICAL DATA:  History of lung cancer post radiation now with new abdominal pain also with history of abdominal aneurysm. EXAM: CT CHEST, ABDOMEN, AND PELVIS WITH CONTRAST TECHNIQUE: Multidetector CT imaging of the chest, abdomen and pelvis was performed following the standard protocol during bolus administration of intravenous contrast. CONTRAST:  155mL OMNIPAQUE IOHEXOL 300 MG/ML  SOLN COMPARISON:  02/28/2019 multiple prior CT evaluations, most recent dedicated CT chest from February 09, 2019 FINDINGS: CT CHEST FINDINGS Cardiovascular: Heart size enlarged with signs of mitral annular calcification, finding similar to the prior study. Calcified and noncalcified plaque in the thoracic aorta with similar appearance to previous exam. Central pulmonary arteries are moderately dilated at 3.2 cm. No pericardial fluid. Mediastinum/Nodes: Heterogeneity of the thyroid without enlargement. No mediastinal adenopathy. Distortion of RIGHT hilum. No gross hilar adenopathy. No LEFT hilar adenopathy or subcarinal adenopathy. No retrocrural adenopathy. Scarring about the LEFT axilla following LEFT mastectomy and axillary dissection without change. Presumed small seroma and or postoperative scarring along the LEFT lower chest is unchanged. Lungs/Pleura: Spiculated mass along the RIGHT posterior lower lobe is obscured by volume loss and pleural fluid. There is a moderate size RIGHT pleural effusion. Discrete measurement of the previous mass is not possible due to volume loss. Emphysema as before. Post  radiation changes along the LEFT chest wall in the setting of prior LEFT mastectomy. Airways are patent. Musculoskeletal: Signs of previous LEFT mastectomy. No chest wall mass. See below for full musculoskeletal details. CT ABDOMEN PELVIS FINDINGS Hepatobiliary: Liver without focal, suspicious lesion. Post cholecystectomy baseline of biliary ductal distension similar to prior studies.  Pancreas: Normal without ductal dilation or inflammation. Spleen: Spleen normal size without focal lesion. Adrenals/Urinary Tract: Adrenal glands are normal. Small low-density lesions in the kidneys likely cysts. No suspicious renal lesion or hydronephrosis. Stomach/Bowel: No acute gastrointestinal process. Colonic diverticulosis. Appendix not visualized. No secondary signs of appendicitis. Vascular/Lymphatic: Large endo sac with signs of type 2 endoleak with peripheral increased density showing increase in density from the venous phase to the delay within the endo sac. Caliber approximately 6.5 x 6.5 cm this measured approximately 6.3 x 6.4 cm on the study of February 28, 2019. No adenopathy in the retroperitoneum. No pelvic lymphadenopathy. Reproductive: Post hysterectomy. Urinary bladder is decompressed, limiting assessment. Other: No abdominal ascites. No peritoneal nodularity. Musculoskeletal: No acute bone finding. No destructive bone process. Spinal degenerative changes. IMPRESSION: 1. Spiculated mass along the posterior RIGHT lower lobe is obscured by volume loss and pleural fluid. 2. No adenopathy or metastatic disease. 3. Moderate size RIGHT pleural effusion. 4. Endosac following aorto bi-iliac endograft placement greater than 6 cm (6.5 x 6.5 cm) with suspicion for type 2 endoleak. Vascular surgery assessment may be warranted, CT angiography could be performed as clinically indicated. 5. Cardiomegaly with signs of mitral annular calcification. 6. Dilated central pulmonary arteries, can be seen in the setting of pulmonary  arterial hypertension. 7. Colonic diverticulosis without evidence of diverticulitis. 8. Scarring about the LEFT axilla following LEFT mastectomy and axillary dissection. 9. Emphysema and aortic atherosclerosis. These results will be called to the ordering clinician or representative by the Radiologist Assistant, and communication documented in the PACS or [employer]. Aortic Atherosclerosis (ICD10-I70.0) and Emphysema (ICD10-J43.9). Electronically Signed   By: Zetta Bills M.D.   On: 06/01/2019 16:07   CT ANGIO AO+BIFEM W & OR WO CONTRAST  Result Date: 06/10/2019 CLINICAL DATA:  Soft tissue mass at the thigh with spontaneous hemorrhage. Possible right femoral artery injury status post endovascular intervention. EXAM: CT ANGIOGRAPHY OF ABDOMINAL AORTA WITH ILIOFEMORAL RUNOFF TECHNIQUE: Multidetector CT imaging of the abdomen, pelvis and lower extremities was performed using the standard protocol during bolus administration of intravenous contrast. Multiplanar CT image reconstructions and MIPs were obtained to evaluate the vascular anatomy. CONTRAST:  151mL OMNIPAQUE IOHEXOL 350 MG/ML SOLN COMPARISON:  None. CT dated 06/01/2019.  PET-CT dated Jun 09, 2019 FINDINGS: VASCULAR Aorta: There is a large abdominal aortic aneurysm measuring approximately 6.6 x 6.6 cm. The patient is status post EVAR. There appears to be a persistent type 2 endoleak likely arising from a lumbar artery. Celiac: Patent without evidence of aneurysm, dissection, vasculitis or significant stenosis. SMA: Patent without evidence of aneurysm, dissection, vasculitis or significant stenosis. Renals: Both renal arteries are patent without evidence of aneurysm, dissection, vasculitis, fibromuscular dysplasia or significant stenosis. IMA: The IMA is not appreciated and may be occluded. RIGHT Lower Extremity Inflow: Common, internal and external iliac arteries are patent without evidence of aneurysm, dissection, vasculitis or significant stenosis.  Outflow: Common, superficial and profunda femoral arteries and the popliteal artery are patent without evidence of aneurysm, dissection, vasculitis or significant stenosis. Runoff: The proximal anterior tibial, posterior tibial, and peroneal arteries appear to be patent, however there is suboptimal opacification above the knee which may be in part due to contrast bolus timing. LEFT Lower Extremity Inflow: Common, internal and external iliac arteries are patent without evidence of aneurysm, dissection, vasculitis or significant stenosis. Outflow: Common, superficial and profunda femoral arteries and the popliteal artery are patent without evidence of aneurysm, dissection, vasculitis or significant stenosis. There is a small  amount of hemorrhage about the left common femoral artery. Runoff: The anterior tibial artery, posterior tibial artery, and peroneal arteries appear to be patent proximally. However, the distal aspects are suboptimally evaluated which is felt to be secondary to suboptimal contrast bolus timing. Veins: No obvious venous abnormality within the limitations of this arterial phase study. Review of the MIP images confirms the above findings. NON-VASCULAR Lower chest: There is a moderate to large, partially visualized right-sided pleural effusion with adjacent compressive atelectasis.There is cardiomegaly. Vascular calcifications are noted of the partially visualized thoracic aorta. Hepatobiliary: The liver is normal. Status post cholecystectomy.There is no biliary ductal dilation. Pancreas: Normal contours without ductal dilatation. No peripancreatic fluid collection. Spleen: Unremarkable. Adrenals/Urinary Tract: --Adrenal glands: Unremarkable. --Right kidney/ureter: No hydronephrosis or radiopaque kidney stones. --Left kidney/ureter: There is a large exophytic cyst arising from the lower pole of the left kidney measuring approximately 5 cm. --Urinary bladder: A bladder diverticulum is noted.  Stomach/Bowel: --Stomach/Duodenum: No hiatal hernia or other gastric abnormality. Normal duodenal course and caliber. --Small bowel: Unremarkable. --Colon: Rectosigmoid diverticulosis without acute inflammation. --Appendix: Normal. Lymphatic: --No retroperitoneal lymphadenopathy. --No mesenteric lymphadenopathy. --No pelvic or inguinal lymphadenopathy. Reproductive: Status post hysterectomy. No adnexal mass. Other: No ascites or free air. The abdominal wall is normal. Musculoskeletal. There is a large hematoma involving the proximal right thigh measuring approximately 9.7 x 3.7 by 10 point 8 cm. There is no evidence for active extravasation at this time. There is significant surrounding soft tissue edema. There are metallic clips within the bilateral inguinal regions. There is hyperdensity within the perisacral musculature. This is favored to represent contrast extravasation from the patient's recent angiogram. There is a moderate size right knee joint effusion. Degenerative changes are noted of the right knee. Metallic coils are now noted at the level of the L5 vertebral body and sacrum. These are likely related to the recent endovascular intervention. IMPRESSION: 1. Large proximal right thigh hematoma without evidence for active extravasation. There are small metallic clips are noted within the soft tissues of the bilateral inguinal region likely representing failure of the Starclose closure. 2. The patient is status post EVAR. The aneurysmal sac demonstrates slight interval increase in size since the prior study dated 06/01/2019, now measuring 6.6 x 6.6 cm. There is a persistent type 2 endoleak that is favored to be arising from a lumbar artery. 3. Large, partially visualized right-sided pleural effusion with adjacent atelectasis. 4. Unremarkable runoff to the level of the mid calf bilaterally. There is suboptimal opacification of the tibial vasculature distally which is felt to be secondary to suboptimal  contrast bolus timing. Correlation with the patient's pedal pulses is recommended. 5. Diverticulosis without CT evidence for diverticulitis. Aortic Atherosclerosis (ICD10-I70.0). These results will be called to the ordering clinician or representative by the Radiologist Assistant, and communication documented in the PACS or [employer]. Electronically Signed   By: Constance Holster M.D.   On: 06/10/2019 19:38   PERIPHERAL VASCULAR CATHETERIZATION  Result Date: 06/10/2019 See op note  NM PET Image Restag (PS) Skull Base To Thigh  Result Date: 06/09/2019 CLINICAL DATA:  Subsequent treatment strategy for right lower lobe lung cancer. EXAM: NUCLEAR MEDICINE PET SKULL BASE TO THIGH TECHNIQUE: 11.1 mCi F-18 FDG was injected intravenously. Full-ring PET imaging was performed from the skull base to thigh after the radiotracer. CT data was obtained and used for attenuation correction and anatomic localization. Fasting blood glucose: 104 mg/dl COMPARISON:  Chest CT 06/01/2019.  PET-CT 02/28/2019. FINDINGS: Mediastinal blood pool activity:  SUV max 3.0 Liver activity: SUV max NA NECK: No hypermetabolic lymph nodes in the neck. Incidental CT findings: none CHEST: Persistent hypermetabolism noted in the right lower lobe mass with SUV max = 5.8 today compared to 11.3 previously. Lesion has decreased in size since previous PET-CT measuring 2.9 x 1.2 cm today, not substantially changed since chest CT of 06/01/2019. Right hilar hypermetabolism seen on the previous study has resolved although there is a 11 mm precarinal node (image 67/3) demonstrating low level hypermetabolism with SUV max = 3.3. Incidental CT findings: Small to moderate right pleural effusion noted. There is right lower lobe collapse/consolidation with subsegmental atelectasis in the right middle and left lower lobes. ABDOMEN/PELVIS: No abnormal hypermetabolic activity within the liver, pancreas, adrenal glands, or spleen. No hypermetabolic lymph nodes in  the abdomen or pelvis. 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 Prominent cyst noted lower pole left kidney. Left-sided diverticulosis without diverticulitis. SKELETON: No focal hypermetabolic activity to suggest skeletal metastasis. Incidental CT findings: none IMPRESSION: 1. Decreased hypermetabolism and decreased size of the right lower lobe pulmonary mass comparing to prior PET-CT. 2. The hypermetabolic right infrahilar metastatic disease seen on the previous PET-CT has resolved but there is a new mildly enlarged precarinal node showing low level hypermetabolism on today's study concerning for metastatic involvement. 3.  Aortic Atherosclerois (ICD10-170.0) Electronically Signed   By: Misty Stanley M.D.   On: 06/09/2019 12:04   US THORACENTESIS ASP PLEURAL SPACE W/IMG GUIDE  Result Date: 06/03/2019 INDICATION: Patient with history of adenocarcinoma of the lung and breast cancer status post left mastectomy presents for therapeutic and diagnostic thoracentesis EXAM: ULTRASOUND GUIDED THERAPEUTIC AND DIAGNOSTIC THORACENTESIS MEDICATIONS: Lidocaine 1% 10 mL COMPLICATIONS: None immediate. PROCEDURE: An ultrasound guided thoracentesis was thoroughly discussed with the patient and questions answered. The benefits, risks, alternatives and complications were also discussed. The patient understands and wishes to proceed with the procedure. Written consent was obtained. Ultrasound was performed to localize and mark an adequate pocket of fluid in the right sided chest. The area was then prepped and draped in the normal sterile fashion. 1% Lidocaine was used for local anesthesia. Under ultrasound guidance a 6 Fr Safe-T-Centesis catheter was introduced. Thoracentesis was performed. The catheter was removed and a dressing applied. FINDINGS: A total of approximately 900 mL of serosanguineous fluid was removed. Samples were  sent to the laboratory as requested by the clinical team. IMPRESSION: Successful ultrasound guided therapeutic and diagnostic right sided thoracentesis yielding 900 mL of pleural fluid. Read by Rushie Nyhan NP Electronically Signed   By: Corrie Mckusick D.O.   On: 06/03/2019 11:04    Assessment/Plan Essential hypertension blood pressure control important in reducing the progression of atherosclerotic disease. On appropriate oral medications.  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  She has received radiation treatment and ultimately this may be a terminal problem, with a median survival of 1 to 2 years it sounds like.  This may be the underlying cause of her pain but it is difficult to discern.  Bilateral lower extremity edema Worsened with the procedure and hematoma.  At this point, I think they can go back into her lymphedema pump which should help significantly.  AAA (abdominal aortic aneurysm) without rupture (HCC) Underwent extensive embolization of several branches feeding the type II endoleak.  Procedure did have a hematoma and this has complicated  things and made her course much more slow.  She understands this is going to take several weeks to resolve the hematoma in the right groin but with elevation and the use of the lymphedema pump that will speed things up.  Increasing her activity would be of benefit that is difficult with all of her many ongoing issues.  We will plan to see her back in a few weeks or sooner if problems develop in the interim    Leotis Pain, MD  06/21/2019 4:52 PM    This note was created with Dragon medical transcription system.  Any errors from dictation are purely unintentional

## 2019-06-21 NOTE — Assessment & Plan Note (Signed)
Worsened with the procedure and hematoma.  At this point, I think they can go back into her lymphedema pump which should help significantly.

## 2019-06-22 DIAGNOSIS — J449 Chronic obstructive pulmonary disease, unspecified: Secondary | ICD-10-CM | POA: Diagnosis not present

## 2019-06-23 ENCOUNTER — Other Ambulatory Visit: Payer: Self-pay | Admitting: Internal Medicine

## 2019-06-23 DIAGNOSIS — C3431 Malignant neoplasm of lower lobe, right bronchus or lung: Secondary | ICD-10-CM

## 2019-06-24 ENCOUNTER — Inpatient Hospital Stay: Payer: Medicare Other | Admitting: Hospice and Palliative Medicine

## 2019-06-24 ENCOUNTER — Other Ambulatory Visit (INDEPENDENT_AMBULATORY_CARE_PROVIDER_SITE_OTHER): Payer: Self-pay | Admitting: Nurse Practitioner

## 2019-06-24 ENCOUNTER — Telehealth (INDEPENDENT_AMBULATORY_CARE_PROVIDER_SITE_OTHER): Payer: Self-pay

## 2019-06-24 MED ORDER — HYDROCODONE-ACETAMINOPHEN 5-325 MG PO TABS: 1.0000 | ORAL_TABLET | Freq: Four times a day (QID) | ORAL | 0 refills | Status: DC | PRN

## 2019-06-24 NOTE — Telephone Encounter (Signed)
Patient and her daughter left a voicemail requesting for a refill for Hydrocodone 5-325 to help with pain relief. I spoke with Dr Lucky Cowboy and he is fine with sending a refill into pharmacy. Eulogio Ditch NP has sent refill into pharmacy and patient daughter will make her mother aware.

## 2019-06-27 ENCOUNTER — Ambulatory Visit: Payer: Medicare Other

## 2019-06-27 ENCOUNTER — Telehealth (INDEPENDENT_AMBULATORY_CARE_PROVIDER_SITE_OTHER): Payer: Self-pay

## 2019-06-28 NOTE — Telephone Encounter (Signed)
Patient daughter called stating that her mother lower left leg has been swelling. The patient daughter stated that she has been wearing her sleeves and taking her pain medication. The patient daughter stated that she will have mother get in bed for 24 hours and elevate with pillows to help with the swelling. I spoke with Dr Lucky Cowboy and recommended for the patient to continue wearing her sleeves, elevate above heart level,and to contact PCP for labwork. Patient daughter was made aware with medical advice and stated she will make her mother aware.

## 2019-06-30 ENCOUNTER — Other Ambulatory Visit: Payer: Medicare Other

## 2019-06-30 ENCOUNTER — Ambulatory Visit: Payer: Medicare Other | Admitting: Internal Medicine

## 2019-07-01 ENCOUNTER — Emergency Department
Admission: EM | Admit: 2019-07-01 | Discharge: 2019-07-01 | Disposition: A | Discharge status: Home / Self Care | Payer: Medicare Other | Source: Home / Self Care | Attending: Emergency Medicine | Admitting: Emergency Medicine

## 2019-07-01 ENCOUNTER — Encounter: Payer: Self-pay | Admitting: Emergency Medicine

## 2019-07-01 ENCOUNTER — Emergency Department: Payer: Medicare Other

## 2019-07-01 ENCOUNTER — Other Ambulatory Visit: Payer: Self-pay

## 2019-07-01 DIAGNOSIS — J910 Malignant pleural effusion: Secondary | ICD-10-CM | POA: Diagnosis not present

## 2019-07-01 DIAGNOSIS — J90 Pleural effusion, not elsewhere classified: Secondary | ICD-10-CM | POA: Diagnosis not present

## 2019-07-01 DIAGNOSIS — I10 Essential (primary) hypertension: Secondary | ICD-10-CM | POA: Insufficient documentation

## 2019-07-01 DIAGNOSIS — Z8249 Family history of ischemic heart disease and other diseases of the circulatory system: Secondary | ICD-10-CM | POA: Diagnosis not present

## 2019-07-01 DIAGNOSIS — Z880 Allergy status to penicillin: Secondary | ICD-10-CM | POA: Diagnosis not present

## 2019-07-01 DIAGNOSIS — J449 Chronic obstructive pulmonary disease, unspecified: Secondary | ICD-10-CM | POA: Diagnosis not present

## 2019-07-01 DIAGNOSIS — Z20822 Contact with and (suspected) exposure to covid-19: Secondary | ICD-10-CM | POA: Diagnosis not present

## 2019-07-01 DIAGNOSIS — D631 Anemia in chronic kidney disease: Secondary | ICD-10-CM | POA: Diagnosis not present

## 2019-07-01 DIAGNOSIS — N1831 Chronic kidney disease, stage 3a: Secondary | ICD-10-CM | POA: Diagnosis not present

## 2019-07-01 DIAGNOSIS — Z885 Allergy status to narcotic agent status: Secondary | ICD-10-CM | POA: Diagnosis not present

## 2019-07-01 DIAGNOSIS — R54 Age-related physical debility: Secondary | ICD-10-CM | POA: Diagnosis not present

## 2019-07-01 DIAGNOSIS — J300 Vasomotor rhinitis: Secondary | ICD-10-CM | POA: Diagnosis not present

## 2019-07-01 DIAGNOSIS — I824Y2 Acute embolism and thrombosis of unspecified deep veins of left proximal lower extremity: Secondary | ICD-10-CM

## 2019-07-01 DIAGNOSIS — R0602 Shortness of breath: Secondary | ICD-10-CM | POA: Diagnosis not present

## 2019-07-01 DIAGNOSIS — I129 Hypertensive chronic kidney disease with stage 1 through stage 4 chronic kidney disease, or unspecified chronic kidney disease: Secondary | ICD-10-CM | POA: Diagnosis not present

## 2019-07-01 DIAGNOSIS — J9621 Acute and chronic respiratory failure with hypoxia: Secondary | ICD-10-CM | POA: Diagnosis not present

## 2019-07-01 DIAGNOSIS — I82412 Acute embolism and thrombosis of left femoral vein: Secondary | ICD-10-CM | POA: Diagnosis not present

## 2019-07-01 DIAGNOSIS — I82812 Embolism and thrombosis of superficial veins of left lower extremities: Secondary | ICD-10-CM | POA: Insufficient documentation

## 2019-07-01 DIAGNOSIS — Z66 Do not resuscitate: Secondary | ICD-10-CM | POA: Diagnosis not present

## 2019-07-01 DIAGNOSIS — R000 Tachycardia, unspecified: Secondary | ICD-10-CM | POA: Diagnosis not present

## 2019-07-01 DIAGNOSIS — J9811 Atelectasis: Secondary | ICD-10-CM | POA: Diagnosis not present

## 2019-07-01 DIAGNOSIS — Z9012 Acquired absence of left breast and nipple: Secondary | ICD-10-CM | POA: Diagnosis not present

## 2019-07-01 DIAGNOSIS — Z87891 Personal history of nicotine dependence: Secondary | ICD-10-CM | POA: Insufficient documentation

## 2019-07-01 DIAGNOSIS — I745 Embolism and thrombosis of iliac artery: Secondary | ICD-10-CM | POA: Diagnosis not present

## 2019-07-01 DIAGNOSIS — Z79899 Other long term (current) drug therapy: Secondary | ICD-10-CM | POA: Insufficient documentation

## 2019-07-01 DIAGNOSIS — D6859 Other primary thrombophilia: Secondary | ICD-10-CM | POA: Diagnosis not present

## 2019-07-01 DIAGNOSIS — Z7951 Long term (current) use of inhaled steroids: Secondary | ICD-10-CM | POA: Diagnosis not present

## 2019-07-01 DIAGNOSIS — C3431 Malignant neoplasm of lower lobe, right bronchus or lung: Secondary | ICD-10-CM | POA: Diagnosis not present

## 2019-07-01 DIAGNOSIS — Z881 Allergy status to other antibiotic agents status: Secondary | ICD-10-CM | POA: Diagnosis not present

## 2019-07-01 DIAGNOSIS — C3491 Malignant neoplasm of unspecified part of right bronchus or lung: Secondary | ICD-10-CM | POA: Diagnosis not present

## 2019-07-01 DIAGNOSIS — Z853 Personal history of malignant neoplasm of breast: Secondary | ICD-10-CM | POA: Diagnosis not present

## 2019-07-01 MED ORDER — APIXABAN 5 MG PO TABS
10.0000 mg | ORAL_TABLET | Freq: Once | ORAL | Status: AC
  Administered 2019-07-01: 10 mg via ORAL
  Filled 2019-07-01: qty 2

## 2019-07-01 MED ORDER — APIXABAN (ELIQUIS) VTE STARTER PACK (10MG AND 5MG)
ORAL_TABLET | ORAL | 0 refills | Status: DC
Start: 2019-07-01 — End: 2019-07-19

## 2019-07-01 NOTE — Discharge Instructions (Addendum)
Please seek medical attention for bloody stool, blood in your urine, or if you were to fall and hit your head. Please seek medical attention for chest pain or shortness of breath, or worsening leg swelling or pain.

## 2019-07-01 NOTE — ED Triage Notes (Signed)
Here for left leg pain and swelling.  Pain in ankle and calf.  Pt had procedure earlier this month and has been walking but not as much as normal.  No other pain at this time.  VSS
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
## 2019-07-01 NOTE — ED Notes (Addendum)
Left lower leg appears notably swollen and cool compared to right lower leg. 1+ pitting edema in right lower extremity noted along with bruising. 2+ pitting edema noted in left lower extremity. Unable to palpate or doppler a pedal pulse in left, faint pedal pulse heard via doppler in right. Left capillary refill is 4-5 seconds.  Pt reports she had vascular surgery in the right leg, and states she walked a little yesterday and it started swelling after that.

## 2019-07-04 ENCOUNTER — Emergency Department: Payer: Medicare Other

## 2019-07-04 ENCOUNTER — Other Ambulatory Visit: Payer: Self-pay

## 2019-07-04 ENCOUNTER — Encounter: Payer: Self-pay | Admitting: Emergency Medicine

## 2019-07-04 ENCOUNTER — Inpatient Hospital Stay
Admission: EM | Admit: 2019-07-04 | Discharge: 2019-07-10 | DRG: 270 | Disposition: A | Discharge status: Skilled Nursing Facility | Payer: Medicare Other | Attending: Internal Medicine | Admitting: Internal Medicine

## 2019-07-04 DIAGNOSIS — Z880 Allergy status to penicillin: Secondary | ICD-10-CM | POA: Diagnosis not present

## 2019-07-04 DIAGNOSIS — Z881 Allergy status to other antibiotic agents status: Secondary | ICD-10-CM | POA: Diagnosis not present

## 2019-07-04 DIAGNOSIS — R54 Age-related physical debility: Secondary | ICD-10-CM | POA: Diagnosis present

## 2019-07-04 DIAGNOSIS — I82402 Acute embolism and thrombosis of unspecified deep veins of left lower extremity: Secondary | ICD-10-CM | POA: Diagnosis not present

## 2019-07-04 DIAGNOSIS — R5381 Other malaise: Secondary | ICD-10-CM | POA: Diagnosis not present

## 2019-07-04 DIAGNOSIS — J9621 Acute and chronic respiratory failure with hypoxia: Secondary | ICD-10-CM | POA: Diagnosis present

## 2019-07-04 DIAGNOSIS — Z20822 Contact with and (suspected) exposure to covid-19: Secondary | ICD-10-CM | POA: Diagnosis present

## 2019-07-04 DIAGNOSIS — Z8249 Family history of ischemic heart disease and other diseases of the circulatory system: Secondary | ICD-10-CM

## 2019-07-04 DIAGNOSIS — I82412 Acute embolism and thrombosis of left femoral vein: Secondary | ICD-10-CM | POA: Diagnosis not present

## 2019-07-04 DIAGNOSIS — Z853 Personal history of malignant neoplasm of breast: Secondary | ICD-10-CM

## 2019-07-04 DIAGNOSIS — Z885 Allergy status to narcotic agent status: Secondary | ICD-10-CM | POA: Diagnosis not present

## 2019-07-04 DIAGNOSIS — N1831 Chronic kidney disease, stage 3a: Secondary | ICD-10-CM | POA: Diagnosis present

## 2019-07-04 DIAGNOSIS — Z7901 Long term (current) use of anticoagulants: Secondary | ICD-10-CM

## 2019-07-04 DIAGNOSIS — Z9012 Acquired absence of left breast and nipple: Secondary | ICD-10-CM

## 2019-07-04 DIAGNOSIS — Z743 Need for continuous supervision: Secondary | ICD-10-CM | POA: Diagnosis not present

## 2019-07-04 DIAGNOSIS — R0600 Dyspnea, unspecified: Secondary | ICD-10-CM

## 2019-07-04 DIAGNOSIS — R000 Tachycardia, unspecified: Secondary | ICD-10-CM | POA: Diagnosis not present

## 2019-07-04 DIAGNOSIS — Z6836 Body mass index (BMI) 36.0-36.9, adult: Secondary | ICD-10-CM

## 2019-07-04 DIAGNOSIS — J449 Chronic obstructive pulmonary disease, unspecified: Secondary | ICD-10-CM | POA: Diagnosis present

## 2019-07-04 DIAGNOSIS — I82409 Acute embolism and thrombosis of unspecified deep veins of unspecified lower extremity: Secondary | ICD-10-CM | POA: Diagnosis present

## 2019-07-04 DIAGNOSIS — I745 Embolism and thrombosis of iliac artery: Secondary | ICD-10-CM | POA: Diagnosis present

## 2019-07-04 DIAGNOSIS — C3431 Malignant neoplasm of lower lobe, right bronchus or lung: Secondary | ICD-10-CM | POA: Diagnosis not present

## 2019-07-04 DIAGNOSIS — Z79899 Other long term (current) drug therapy: Secondary | ICD-10-CM | POA: Diagnosis not present

## 2019-07-04 DIAGNOSIS — C3491 Malignant neoplasm of unspecified part of right bronchus or lung: Secondary | ICD-10-CM | POA: Diagnosis not present

## 2019-07-04 DIAGNOSIS — J910 Malignant pleural effusion: Secondary | ICD-10-CM | POA: Diagnosis present

## 2019-07-04 DIAGNOSIS — Z515 Encounter for palliative care: Secondary | ICD-10-CM

## 2019-07-04 DIAGNOSIS — I129 Hypertensive chronic kidney disease with stage 1 through stage 4 chronic kidney disease, or unspecified chronic kidney disease: Secondary | ICD-10-CM | POA: Diagnosis present

## 2019-07-04 DIAGNOSIS — D649 Anemia, unspecified: Secondary | ICD-10-CM | POA: Diagnosis not present

## 2019-07-04 DIAGNOSIS — E669 Obesity, unspecified: Secondary | ICD-10-CM | POA: Diagnosis present

## 2019-07-04 DIAGNOSIS — Z9889 Other specified postprocedural states: Secondary | ICD-10-CM

## 2019-07-04 DIAGNOSIS — J300 Vasomotor rhinitis: Secondary | ICD-10-CM | POA: Diagnosis present

## 2019-07-04 DIAGNOSIS — R0602 Shortness of breath: Secondary | ICD-10-CM | POA: Diagnosis not present

## 2019-07-04 DIAGNOSIS — J90 Pleural effusion, not elsewhere classified: Secondary | ICD-10-CM

## 2019-07-04 DIAGNOSIS — J9811 Atelectasis: Secondary | ICD-10-CM | POA: Diagnosis not present

## 2019-07-04 DIAGNOSIS — Z7401 Bed confinement status: Secondary | ICD-10-CM | POA: Diagnosis not present

## 2019-07-04 DIAGNOSIS — D6859 Other primary thrombophilia: Secondary | ICD-10-CM | POA: Diagnosis present

## 2019-07-04 DIAGNOSIS — R531 Weakness: Secondary | ICD-10-CM | POA: Diagnosis not present

## 2019-07-04 DIAGNOSIS — Z7951 Long term (current) use of inhaled steroids: Secondary | ICD-10-CM

## 2019-07-04 DIAGNOSIS — I10 Essential (primary) hypertension: Secondary | ICD-10-CM | POA: Diagnosis present

## 2019-07-04 DIAGNOSIS — D631 Anemia in chronic kidney disease: Secondary | ICD-10-CM | POA: Diagnosis present

## 2019-07-04 DIAGNOSIS — Z87891 Personal history of nicotine dependence: Secondary | ICD-10-CM

## 2019-07-04 DIAGNOSIS — Z9981 Dependence on supplemental oxygen: Secondary | ICD-10-CM

## 2019-07-04 DIAGNOSIS — J430 Unilateral pulmonary emphysema [MacLeod's syndrome]: Secondary | ICD-10-CM | POA: Diagnosis not present

## 2019-07-04 DIAGNOSIS — Z923 Personal history of irradiation: Secondary | ICD-10-CM

## 2019-07-04 DIAGNOSIS — M2550 Pain in unspecified joint: Secondary | ICD-10-CM | POA: Diagnosis not present

## 2019-07-04 DIAGNOSIS — Z66 Do not resuscitate: Secondary | ICD-10-CM | POA: Diagnosis not present

## 2019-07-04 DIAGNOSIS — Z86718 Personal history of other venous thrombosis and embolism: Secondary | ICD-10-CM

## 2019-07-04 DIAGNOSIS — C801 Malignant (primary) neoplasm, unspecified: Secondary | ICD-10-CM | POA: Diagnosis not present

## 2019-07-04 LAB — CBC WITH DIFFERENTIAL/PLATELET
Abs Immature Granulocytes: 0.06 10*3/uL (ref 0.00–0.07)
Basophils Absolute: 0 10*3/uL (ref 0.0–0.1)
Basophils Relative: 0 %
Eosinophils Absolute: 0.1 10*3/uL (ref 0.0–0.5)
Eosinophils Relative: 2 %
HCT: 36.9 % (ref 36.0–46.0)
Hemoglobin: 11.9 g/dL — ABNORMAL LOW (ref 12.0–15.0)
Immature Granulocytes: 1 %
Lymphocytes Relative: 8 %
Lymphs Abs: 0.6 10*3/uL — ABNORMAL LOW (ref 0.7–4.0)
MCH: 29.2 pg (ref 26.0–34.0)
MCHC: 32.2 g/dL (ref 30.0–36.0)
MCV: 90.7 fL (ref 80.0–100.0)
Monocytes Absolute: 0.7 10*3/uL (ref 0.1–1.0)
Monocytes Relative: 9 %
Neutro Abs: 5.6 10*3/uL (ref 1.7–7.7)
Neutrophils Relative %: 80 %
Platelets: 181 10*3/uL (ref 150–400)
RBC: 4.07 MIL/uL (ref 3.87–5.11)
RDW: 16.8 % — ABNORMAL HIGH (ref 11.5–15.5)
WBC: 7 10*3/uL (ref 4.0–10.5)
nRBC: 0 % (ref 0.0–0.2)

## 2019-07-04 LAB — BASIC METABOLIC PANEL
Anion gap: 10 (ref 5–15)
BUN: 25 mg/dL — ABNORMAL HIGH (ref 8–23)
CO2: 30 mmol/L (ref 22–32)
Calcium: 9.2 mg/dL (ref 8.9–10.3)
Chloride: 95 mmol/L — ABNORMAL LOW (ref 98–111)
Creatinine, Ser: 1.13 mg/dL — ABNORMAL HIGH (ref 0.44–1.00)
GFR calc Af Amer: 49 mL/min — ABNORMAL LOW (ref >60)
GFR calc non Af Amer: 42 mL/min — ABNORMAL LOW (ref >60)
Glucose, Bld: 118 mg/dL — ABNORMAL HIGH (ref 70–99)
Potassium: 4.5 mmol/L (ref 3.5–5.1)
Sodium: 135 mmol/L (ref 135–145)

## 2019-07-04 LAB — APTT: aPTT: 44 seconds — ABNORMAL HIGH (ref 24–36)

## 2019-07-04 LAB — SARS CORONAVIRUS 2 BY RT PCR (HOSPITAL ORDER, PERFORMED IN ~~LOC~~ HOSPITAL LAB): SARS Coronavirus 2: NEGATIVE

## 2019-07-04 LAB — TROPONIN I (HIGH SENSITIVITY)
Troponin I (High Sensitivity): 12 ng/L (ref <18)
Troponin I (High Sensitivity): 12 ng/L (ref <18)

## 2019-07-04 LAB — PROTIME-INR
INR: 1.8 — ABNORMAL HIGH (ref 0.8–1.2)
Prothrombin Time: 19.8 seconds — ABNORMAL HIGH (ref 11.4–15.2)

## 2019-07-04 LAB — HEPARIN LEVEL (UNFRACTIONATED): Heparin Unfractionated: >3.6 IU/mL — ABNORMAL HIGH (ref 0.30–0.70)

## 2019-07-04 LAB — BRAIN NATRIURETIC PEPTIDE: B Natriuretic Peptide: 68.9 pg/mL (ref 0.0–100.0)

## 2019-07-04 MED ORDER — ACETAMINOPHEN 650 MG RE SUPP: 650.0000 mg | Freq: Four times a day (QID) | RECTAL | Status: DC | PRN

## 2019-07-04 MED ORDER — HYDROCODONE-ACETAMINOPHEN 5-325 MG PO TABS
1.0000 | ORAL_TABLET | Freq: Four times a day (QID) | ORAL | Status: DC | PRN
  Administered 2019-07-05 – 2019-07-10 (×12): 1 via ORAL
  Filled 2019-07-04 (×12): qty 1

## 2019-07-04 MED ORDER — ACETAMINOPHEN 325 MG PO TABS: 650.0000 mg | ORAL_TABLET | Freq: Four times a day (QID) | ORAL | Status: DC | PRN

## 2019-07-04 MED ORDER — CARVEDILOL 3.125 MG PO TABS
3.1250 mg | ORAL_TABLET | Freq: Two times a day (BID) | ORAL | Status: DC
  Administered 2019-07-04 – 2019-07-10 (×11): 3.125 mg via ORAL
  Filled 2019-07-04: qty 0.5
  Filled 2019-07-04 (×3): qty 1
  Filled 2019-07-04 (×2): qty 0.5
  Filled 2019-07-04 (×2): qty 1
  Filled 2019-07-04: qty 0.5
  Filled 2019-07-04 (×3): qty 1

## 2019-07-04 MED ORDER — LOSARTAN POTASSIUM 50 MG PO TABS
50.0000 mg | ORAL_TABLET | Freq: Every day | ORAL | Status: DC
  Administered 2019-07-05 – 2019-07-10 (×5): 50 mg via ORAL
  Filled 2019-07-04 (×5): qty 1

## 2019-07-04 MED ORDER — SODIUM CHLORIDE 0.9% FLUSH
3.0000 mL | Freq: Two times a day (BID) | INTRAVENOUS | Status: DC
  Administered 2019-07-05 – 2019-07-09 (×7): 3 mL via INTRAVENOUS

## 2019-07-04 MED ORDER — HEPARIN (PORCINE) 25000 UT/250ML-% IV SOLN
1200.0000 [IU]/h | INTRAVENOUS | Status: DC
  Administered 2019-07-04: 1200 [IU]/h via INTRAVENOUS
  Filled 2019-07-04: qty 250

## 2019-07-04 MED ORDER — ONDANSETRON HCL 4 MG PO TABS: 4.0000 mg | ORAL_TABLET | Freq: Four times a day (QID) | ORAL | Status: DC | PRN

## 2019-07-04 MED ORDER — ALBUTEROL SULFATE (2.5 MG/3ML) 0.083% IN NEBU: 2.5000 mg | INHALATION_SOLUTION | Freq: Four times a day (QID) | RESPIRATORY_TRACT | Status: DC | PRN

## 2019-07-04 MED ORDER — SODIUM CHLORIDE 0.9 % IV SOLN: 250.0000 mL | INTRAVENOUS | Status: DC | PRN

## 2019-07-04 MED ORDER — VITAMIN B-12 1000 MCG PO TABS
1000.0000 ug | ORAL_TABLET | Freq: Every day | ORAL | Status: DC
  Administered 2019-07-05 – 2019-07-10 (×5): 1000 ug via ORAL
  Filled 2019-07-04 (×5): qty 1

## 2019-07-04 MED ORDER — ONDANSETRON HCL 4 MG/2ML IJ SOLN: 4.0000 mg | Freq: Four times a day (QID) | INTRAMUSCULAR | Status: DC | PRN

## 2019-07-04 MED ORDER — SODIUM CHLORIDE 0.9% FLUSH: 3.0000 mL | INTRAVENOUS | Status: DC | PRN

## 2019-07-04 MED ORDER — VITAMIN D 25 MCG (1000 UNIT) PO TABS
1000.0000 [IU] | ORAL_TABLET | Freq: Every day | ORAL | Status: DC
  Administered 2019-07-05 – 2019-07-10 (×5): 1000 [IU] via ORAL
  Filled 2019-07-04 (×5): qty 1

## 2019-07-04 MED ORDER — MOMETASONE FURO-FORMOTEROL FUM 200-5 MCG/ACT IN AERO
2.0000 | INHALATION_SPRAY | Freq: Two times a day (BID) | RESPIRATORY_TRACT | Status: DC
  Administered 2019-07-04 – 2019-07-10 (×11): 2 via RESPIRATORY_TRACT
  Filled 2019-07-04: qty 8.8

## 2019-07-04 MED ORDER — IOHEXOL 350 MG/ML SOLN
60.0000 mL | Freq: Once | INTRAVENOUS | Status: AC | PRN
  Administered 2019-07-04: 60 mL via INTRAVENOUS

## 2019-07-04 NOTE — ED Notes (Signed)
Pt given snack and drink with verbal okay from [place].

## 2019-07-04 NOTE — ED Notes (Signed)
Attempted for IV at [facility].

## 2019-07-04 NOTE — ED Notes (Signed)
Pt given dinner tray. Pt didn't eat much from it as she had filled up on peanut butter.

## 2019-07-04 NOTE — ED Notes (Signed)
Pt ambulated to the restroom with standby assistance. Urine sample placed on the counter in the room due to no urine sample order at this time.
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
## 2019-07-04 NOTE — ED Notes (Signed)
Floor given this RN's name and ascom number.

## 2019-07-04 NOTE — ED Triage Notes (Signed)
Pt was here Friday for leg swelling and dx with blood clot. Started eliquis.  Swelling getting worse per pt and family.  Swelling now above knee and was previously only below.  Daughter reports blisters from swelling.

## 2019-07-04 NOTE — ED Notes (Addendum)
Pt's R lower leg red/purple in discoloration at ankle. L leg edematous from above knee to foot. Foot 3+ pitting; just below knee pitting. Pulses 1+; feet warm; L leg cool were R leg warm. L leg appropriate in color. Family reports "water blisters" to legs. Small blister-like areas noted around pt's calves. Pt has pink arm band to L wrist. Pt sitting calmly in bed. Resp: reg/unlabored. Pt HOH; has hearing aides in.

## 2019-07-04 NOTE — ED Notes (Signed)
Checked for dinner tray. Trays not yet to ED.
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
## 2019-07-04 NOTE — ED Notes (Signed)
Pt up bedside toilet. Will complete EKG and bloodwork once pt back to bed. Family remain at bedside. Pt steady from bed to toilet. Family states pt recently started using a walker.

## 2019-07-04 NOTE — ED Notes (Signed)
Pt remains off unit at [place].

## 2019-07-05 ENCOUNTER — Telehealth: Payer: Self-pay

## 2019-07-05 ENCOUNTER — Other Ambulatory Visit (INDEPENDENT_AMBULATORY_CARE_PROVIDER_SITE_OTHER): Payer: Self-pay | Admitting: Vascular Surgery

## 2019-07-05 DIAGNOSIS — C3491 Malignant neoplasm of unspecified part of right bronchus or lung: Secondary | ICD-10-CM

## 2019-07-05 LAB — BASIC METABOLIC PANEL
Anion gap: 11 (ref 5–15)
BUN: 21 mg/dL (ref 8–23)
CO2: 31 mmol/L (ref 22–32)
Calcium: 9.2 mg/dL (ref 8.9–10.3)
Chloride: 96 mmol/L — ABNORMAL LOW (ref 98–111)
Creatinine, Ser: 0.91 mg/dL (ref 0.44–1.00)
GFR calc Af Amer: >60 mL/min (ref >60)
GFR calc non Af Amer: 54 mL/min — ABNORMAL LOW (ref >60)
Glucose, Bld: 108 mg/dL — ABNORMAL HIGH (ref 70–99)
Potassium: 4.2 mmol/L (ref 3.5–5.1)
Sodium: 138 mmol/L (ref 135–145)

## 2019-07-05 LAB — CBC
HCT: 37.5 % (ref 36.0–46.0)
Hemoglobin: 11.9 g/dL — ABNORMAL LOW (ref 12.0–15.0)
MCH: 28.6 pg (ref 26.0–34.0)
MCHC: 31.7 g/dL (ref 30.0–36.0)
MCV: 90.1 fL (ref 80.0–100.0)
Platelets: 203 10*3/uL (ref 150–400)
RBC: 4.16 MIL/uL (ref 3.87–5.11)
RDW: 16.7 % — ABNORMAL HIGH (ref 11.5–15.5)
WBC: 6.5 10*3/uL (ref 4.0–10.5)
nRBC: 0 % (ref 0.0–0.2)

## 2019-07-05 LAB — HEPARIN LEVEL (UNFRACTIONATED): Heparin Unfractionated: >3.6 IU/mL — ABNORMAL HIGH (ref 0.30–0.70)

## 2019-07-05 LAB — APTT
aPTT: >160 seconds (ref 24–36)
aPTT: >160 seconds (ref 24–36)

## 2019-07-05 MED ORDER — HEPARIN (PORCINE) 25000 UT/250ML-% IV SOLN
950.0000 [IU]/h | INTRAVENOUS | Status: DC
  Administered 2019-07-05 (×2): 950 [IU]/h via INTRAVENOUS
  Filled 2019-07-05: qty 250

## 2019-07-05 MED ORDER — ENSURE ENLIVE PO LIQD
237.0000 mL | Freq: Two times a day (BID) | ORAL | Status: DC
  Administered 2019-07-06 – 2019-07-09 (×2): 237 mL via ORAL

## 2019-07-05 MED ORDER — SODIUM CHLORIDE 0.9 % IV SOLN: INTRAVENOUS | Status: DC

## 2019-07-05 MED ORDER — HEPARIN (PORCINE) 25000 UT/250ML-% IV SOLN
750.0000 [IU]/h | INTRAVENOUS | Status: DC
  Administered 2019-07-05: 750 [IU]/h via INTRAVENOUS

## 2019-07-05 NOTE — Progress Notes (Signed)
PROGRESS NOTE    Gail Nelson  GEX:[id number] DOB: 06-28-1923 DOA: 07/04/2019 PCP: Lavera Guise, MD     Brief Narrative:   Gail Nelson is a 86 y.o. WF PMHx RIGHT lower lobe stage IV Adenocarcinoma lung cancer (recently diagnosed during chemotherapy), COPD, HTN,Abdominal Aortic Aneurysm s/p repair and DVT on Eliquis   who presents to the ED with complaints of left leg swelling.  Patient had an endoleak following AAA repair and had a hematoma involving her right lower extremity but that seems to have improved.  She was started on Eliquis for her DVT and follow-up ultrasound shows progression of DVT now showing complete occlusion of the common femoral vein.  Patient complains of chest discomfort which she describes as a pleuritic pain that occurs when she coughs.   Patient had a CT angiogram which shows no evidence acute pulmonary embolism. Chronic large RIGHT pleural effusion with passive atelectasis of the RIGHT lung. Left leg venous Doppler shows in comparison with recent study, there is now essentially complete occlusion of the left common femoral vein and left saphenofemoral junction regions. Thrombus is no longer appreciable in the left profunda femoral vein. There has been likely propagation of acute deep venous thrombosis more proximally into the left common femoral junction region. Note that this circumstance places patient at significant increased risk for pulmonary embolus development. She denies having any fever or chills, no cough, no dizziness or lightheadedness, no urinary symptoms or changes in her bowel habits.   ED Course: 86 year old female with a history of lung cancer and recently diagnosed DVT who presents to the emergency room for worsening lower extremity swelling.  Patient had a left leg venous Doppler which showed In comparison with recent study, there is now essentially complete occlusion of the left common femoral vein and left saphenofemoral junction  regions. Thrombus is no longer appreciable in the left profunda femoral vein. There has been likely propagation of acute deep venous thrombosis more proximally into the left common femoral junction region. Note that this circumstance places patient at significant increased risk for pulmonary embolus development. Visualized proximal inferior vena cava appears patent without evident thrombus.    Subjective: A/O x4, negative CP, negative S OB, negative ABDOMINAL PAIN.  POSITIVE left lower extremity pain.   Assessment & Plan:   Principal Problem:   DVT (deep venous thrombosis) (HCC) Active Problems:   Chronic obstructive pulmonary disease (HCC)   Benign hypertension   Primary malignant neoplasm of right lower lobe of lung (HCC)    DVT (deep venous thrombosis) (HCC) Active Problems:   Chronic obstructive pulmonary disease (HCC)   Benign hypertension   Primary malignant neoplasm of right lower lobe of lung (HCC)   LEFT leg DVT -Failed anticoagulant therapy (Eliquis); DVT has extended and fully occluded left lower extremity common femoral vein and left saphenofemoral -Evaluated by Dr. Harold Barban vascular surgery on 5/31.  Agrees with starting patient on heparin see note ADDENDUM; plan is to take patient for undergoing mechanical thrombectomy with placement of an IVC filter on 6/2.  RIGHT lower lobe stage IV Adenocarcinoma lung cancer -Per Dr.Govinda Brahmanday Oncology note from 06/20/2019 patient s/p radiation treatment 06/09/2019.  Also received Keytruda but was placed on hold secondary to side effects. -Patient was to follow-up in 3 weeks  Hypertension -Coreg 3.125 mg BID -Losartan 50 mg daily   COPD Continue as needed bronchodilator therapy as well as inhaled steroids     DVT prophylaxis: Heparin drip Code Status: DNR Family  Communication:  Disposition Plan:  Status is: Inpatient    Dispo: The patient is from: Home              Anticipated d/c is to: Home               Anticipated d/c date is: Per vascular surgery              Patient currently unstable      Consultants:  Dr. Harold Barban vascular surgery    Procedures/Significant Events:    I have personally reviewed and interpreted all radiology studies and my findings are as above.  VENTILATOR SETTINGS:    Cultures   Antimicrobials:    Devices    LINES / TUBES:      Continuous Infusions: . sodium chloride    . heparin 950 Units/hr (07/05/19 0806)     Objective: Vitals:   07/04/19 1830 07/04/19 1920 07/04/19 2242 07/05/19 0418  BP: (!) 113/58 140/76 (!) 143/77 (!) 156/77  Pulse: 98 96 96 92  Resp: 17 16  16   Temp:  (!) 97.5 F (36.4 C) 98.1 F (36.7 C) 98.2 F (36.8 C)  TempSrc:  Oral Oral Oral  SpO2: 95% 92% 95% 94%  Weight:      Height:        Intake/Output Summary (Last 24 hours) at 07/05/2019 0813 Last data filed at 07/05/2019 0300 Gross per 24 hour  Intake 81.81 ml  Output 300 ml  Net -218.19 ml   Filed Weights   07/04/19 1103  Weight: 89.9 kg    Examination:  General: A/O x4, no acute respiratory distress Eyes: negative scleral hemorrhage, negative anisocoria, negative icterus ENT: Negative Runny nose, negative gingival bleeding, Neck:  Negative scars, masses, torticollis, lymphadenopathy, JVD Lungs: Clear to auscultation bilaterally without wheezes or crackles Cardiovascular: Regular rate and rhythm without murmur gallop or rub normal S1 and S2 Abdomen: negative abdominal pain, nondistended, positive soft, bowel sounds, no rebound, no ascites, no appreciable mass Extremities: No significant cyanosis, clubbing, 3+ pitting edema with pain left lower extremity.  Right lower extremity multiple areas of ecchymosis (old) Skin: Negative rashes, lesions, ulcers Psychiatric:  Negative depression, negative anxiety, negative fatigue, negative mania  Central nervous system:  Cranial nerves II through XII intact, tongue/uvula midline, all  extremities muscle strength 5/5, sensation intact throughout,negative dysarthria, negative expressive aphasia, negative receptive aphasia.  .     Data Reviewed: Care during the described time interval was provided by me .  I have reviewed this patient's available data, including medical history, events of note, physical examination, and all test results as part of my evaluation.  CBC: Recent Labs  Lab 07/04/19 1243 07/05/19 0419  WBC 7.0 6.5  NEUTROABS 5.6  --   HGB 11.9* 11.9*  HCT 36.9 37.5  MCV 90.7 90.1  PLT 181 001   Basic Metabolic Panel: Recent Labs  Lab 07/04/19 1243 07/05/19 0419  NA 135 138  K 4.5 4.2  CL 95* 96*  CO2 30 31  GLUCOSE 118* 108*  BUN 25* 21  CREATININE 1.13* 0.91  CALCIUM 9.2 9.2   GFR: Estimated Creatinine Clearance: 40.2 mL/min (by C-G formula based on SCr of 0.91 mg/dL). Liver Function Tests: No results for input(s): AST, ALT, ALKPHOS, BILITOT, PROT, ALBUMIN in the last 168 hours. No results for input(s): LIPASE, AMYLASE in the last 168 hours. No results for input(s): AMMONIA in the last 168 hours. Coagulation Profile: Recent Labs  Lab 07/04/19 1243  INR 1.8*  Cardiac Enzymes: No results for input(s): CKTOTAL, CKMB, CKMBINDEX, TROPONINI in the last 168 hours. BNP (last 3 results) No results for input(s): PROBNP in the last 8760 hours. HbA1C: No results for input(s): HGBA1C in the last 72 hours. CBG: No results for input(s): GLUCAP in the last 168 hours. Lipid Profile: No results for input(s): CHOL, HDL, LDLCALC, TRIG, CHOLHDL, LDLDIRECT in the last 72 hours. Thyroid Function Tests: No results for input(s): TSH, T4TOTAL, FREET4, T3FREE, THYROIDAB in the last 72 hours. Anemia Panel: No results for input(s): VITAMINB12, FOLATE, FERRITIN, TIBC, IRON, RETICCTPCT in the last 72 hours. Sepsis Labs: No results for input(s): PROCALCITON, LATICACIDVEN in the last 168 hours.  Recent Results (from the past 240 hour(s))  SARS Coronavirus  2 by RT PCR (hospital order, performed in [facility] hospital lab) Nasopharyngeal Nasopharyngeal Swab     Status: None   Collection Time: 07/04/19  3:54 PM   Specimen: Nasopharyngeal Swab  Result Value Ref Range Status   SARS Coronavirus 2 NEGATIVE NEGATIVE Final    Comment: (NOTE) SARS-CoV-2 target nucleic acids are NOT DETECTED. The SARS-CoV-2 RNA is generally detectable in upper and lower respiratory specimens during the acute phase of infection. The lowest concentration of SARS-CoV-2 viral copies this assay can detect is 250 copies / mL. A negative result does not preclude SARS-CoV-2 infection and should not be used as the sole basis for treatment or other patient management decisions.  A negative result may occur with improper specimen collection / handling, submission of specimen other than nasopharyngeal swab, presence of viral mutation(s) within the areas targeted by this assay, and inadequate number of viral copies (<250 copies / mL). A negative result must be combined with clinical observations, patient history, and epidemiological information. Fact Sheet for Patients:   StrictlyIdeas.no Fact Sheet for Healthcare Providers: BankingDealers.co.za This test is not yet approved or cleared  by the [place] FDA and has been authorized for detection and/or diagnosis of SARS-CoV-2 by FDA under an Emergency Use Authorization (EUA).  This EUA will remain in effect (meaning this test can be used) for the duration of the COVID-19 declaration under Section 564(b)(1) of the Act, 21 U.S.C. section 360bbb-3(b)(1), unless the authorization is terminated or revoked sooner. Performed at [facility], [address] [place],  [address]          Radiology Studies: CT Angio Chest PE W/Cm &/Or Wo Cm  Result Date: 07/04/2019 CLINICAL DATA:  LEFT leg swelling.  Short of breath. EXAM: CT ANGIOGRAPHY CHEST WITH CONTRAST  TECHNIQUE: Multidetector CT imaging of the chest was performed using the standard protocol during bolus administration of intravenous contrast. Multiplanar CT image reconstructions and MIPs were obtained to evaluate the vascular anatomy. CONTRAST:  8mL OMNIPAQUE IOHEXOL 350 MG/ML SOLN COMPARISON:  Doppler ultrasound lower extremities 07/04/2019, CT angiogram chest 06/10/2019. FINDINGS: Cardiovascular: No filling defects within the pulmonary arteries to suggest acute pulmonary embolism. Coronary artery calcification and aortic atherosclerotic calcification. Mediastinum/Nodes: No axillary supraclavicular adenopathy. No mediastinal hilar adenopathy no pericardial effusion. Esophagus normal Lungs/Pleura: Large layering RIGHT pleural effusion similar to comparison CT. There is passive atelectasis of the RIGHT lower lobe. No pulmonary infarction identified. Atelectasis within the RIGHT middle lobe is also similar prior. Upper Abdomen: Limited view of the liver, kidneys, pancreas are unremarkable. Normal adrenal glands. Musculoskeletal: No aggressive osseous lesion. Review of the MIP images confirms the above findings. IMPRESSION: 1. No evidence acute pulmonary embolism. 2. Chronic large RIGHT pleural effusion with passive atelectasis of the RIGHT  lung. Electronically Signed   By: Suzy Bouchard M.D.   On: 07/04/2019 14:35   US Venous Img Lower Unilateral Left  Result Date: 07/04/2019 CLINICAL DATA:  Soft tissue swelling EXAM: LEFT LOWER EXTREMITY VENOUS DUPLEX ULTRASOUND TECHNIQUE: Gray-scale sonography with graded compression, as well as color Doppler and duplex ultrasound were performed to evaluate the left lower extremity deep venous system from the level of the common femoral vein and including the common femoral, femoral, profunda femoral, popliteal and calf veins including the posterior tibial, peroneal and gastrocnemius veins when visible. The superficial great saphenous vein was also interrogated. Spectral  Doppler was utilized to evaluate flow at rest and with distal augmentation maneuvers in the common femoral, femoral and popliteal veins. COMPARISON:  Jul 01, 2019 FINDINGS: Contralateral Common Femoral Vein: There is acute appearing thrombus in the left common femoral vein with loss of compressibility and Doppler signal. Common Femoral Vein: There is absence of flow at the left saphenofemoral junction with loss of apparent flow and compressibility. Saphenofemoral Junction: No evidence of thrombus. Normal compressibility and flow on color Doppler imaging. Profunda Femoral Vein: No evidence of thrombus. Normal compressibility and flow on color Doppler imaging. Femoral Vein: No evidence of thrombus. Normal compressibility, respiratory phasicity and response to augmentation. Popliteal Vein: No evidence of thrombus. Normal compressibility, respiratory phasicity and response to augmentation. Calf Veins: No thrombus evident in the posterior tibial vein. There is incomplete obstruction of the left peroneal vein with diminished flow and diminished compressibility. Superficial Great Saphenous Vein: No evidence of thrombus. Normal compressibility. Venous Reflux:  None. Other Findings:  Patency of the proximal inferior vena cava noted. IMPRESSION: In comparison with recent study, there is now essentially complete occlusion of the left common femoral vein and left saphenofemoral junction regions. Thrombus is no longer appreciable in the left profunda femoral vein. There has been likely propagation of acute deep venous thrombosis more proximally into the left common femoral junction region. Note that this circumstance places patient at significant increased risk for pulmonary embolus development. Visualized proximal inferior vena cava appears patent without evident thrombus. Electronically Signed   By: Lowella Grip III M.D.   On: 07/04/2019 12:08        Scheduled Meds: . carvedilol  3.125 mg Oral BID  .  cholecalciferol  1,000 Units Oral Daily  . losartan  50 mg Oral Daily  . mometasone-formoterol  2 puff Inhalation BID  . sodium chloride flush  3 mL Intravenous Q12H  . vitamin B-12  1,000 mcg Oral Daily   Continuous Infusions: . sodium chloride    . heparin 950 Units/hr (07/05/19 0806)     LOS: 1 day    Time spent:40 min    Mitul Hallowell, Geraldo Docker, MD [facility] Pager [phone]  If 7PM-7AM, please contact night-coverage [email or web address] Password [facility] 07/05/2019, 8:13 AM
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
## 2019-07-05 NOTE — Progress Notes (Addendum)
Lab notified critical lab PTT greater than 160, phoned pharmacy and made Tobie Lords, [facility] aware, orders to stop current drip x1.5 hour then restart at lower dose received, Shanon Brow states new dose and orders will be entered, Heparin drip stopped and education provided to pt, pt verbalized understanding, pt currently in bed with call bell in reach and bed in low safe position, will continue to monitor pt this shift.

## 2019-07-05 NOTE — Progress Notes (Signed)
[facility] Daily Progress Note  Subjective: Patient with continued left lower extremity pain and swelling.  No issues overnight.  Objective: Vitals:   07/04/19 1920 07/04/19 2242 07/05/19 0418 07/05/19 0827  BP: 140/76 (!) 143/77 (!) 156/77 137/61  Pulse: 96 96 92 81  Resp: 16  16 20   Temp: (!) 97.5 F (36.4 C) 98.1 F (36.7 C) 98.2 F (36.8 C) 97.7 F (36.5 C)  TempSrc: Oral Oral Oral Oral  SpO2: 92% 95% 94% 93%  Weight:      Height:        Intake/Output Summary (Last 24 hours) at 07/05/2019 1231 Last data filed at 07/05/2019 0300 Gross per 24 hour  Intake 81.81 ml  Output 300 ml  Net -218.19 ml   Physical Exam: A&Ox3, NAD CV: RRR Pulmonary: CTA Bilaterally Abdomen: Soft, Nontender, Nondistended Vascular:  Left lower extremity: Thigh soft.  Calf soft.  Extremity is moderately edematous.  Warm to toes.  Hard to palpate pedal pulses however there is no acute vascular compromise noted to the extremity at this time.  Motor/sensory is intact.   Laboratory: CBC    Component Value Date/Time   WBC 6.5 07/05/2019 0419   HGB 11.9 (L) 07/05/2019 0419   HGB 14.0 08/12/2017 1332   HCT 37.5 07/05/2019 0419   HCT 43.1 08/12/2017 1332   PLT 203 07/05/2019 0419   PLT 173 12/23/2013 0513   BMET    Component Value Date/Time   NA 138 07/05/2019 0419   NA 140 12/23/2013 0513   K 4.2 07/05/2019 0419   K 4.1 12/23/2013 0513   CL 96 (L) 07/05/2019 0419   CL 104 12/23/2013 0513   CO2 31 07/05/2019 0419   CO2 29 12/23/2013 0513   GLUCOSE 108 (H) 07/05/2019 0419   GLUCOSE 102 (H) 12/23/2013 0513   BUN 21 07/05/2019 0419   BUN 8 12/23/2013 0513   CREATININE 0.91 07/05/2019 0419   CREATININE 0.71 12/23/2013 0513   CALCIUM 9.2 07/05/2019 0419   CALCIUM 7.6 (L) 12/23/2013 0513   GFRNONAA 54 (L) 07/05/2019 0419   GFRNONAA >60 12/23/2013 0513   GFRAA >60 07/05/2019 0419   GFRAA >60 12/23/2013 0513   Assessment/Planning: The patient is a 86 year old with  multiple medical issues including terminal lung cancer found to have propagation of a left lower extremity DVT  1) Left Lower Extremity DVT: - Provoked.  Most likely due to hypercoagulability (cancer). - Patient is rather symptomatic complaining of pain.  Patient with a moderate amount of edema noted on physical exam. - Recommend undergoing mechanical thrombectomy with placement of an IVC filter. - Unable to use TPA due to the patient having cancer as this increases her risk of bleeding. - Agree with initiation of heparin and transitioning to Eliquis post procedure. - Daughter has concerns with patient being discharged back home.  She feels that it is unsafe.  Possibly consider PT/OT evaluations for dispo to SNF if appropriate.  Discussed with Dr. Ellis Parents [place] Schweppe PA-C 07/05/2019 12:31 PM
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
## 2019-07-05 NOTE — Progress Notes (Signed)
Initial Nutrition Assessment  DOCUMENTATION CODES:   Obesity unspecified  INTERVENTION:  Ensure Enlive po BID, each supplement provides 350 kcal and 20 grams of protein   NUTRITION DIAGNOSIS:   Increased nutrient needs related to cancer and cancer related treatments as evidenced by estimated needs.    GOAL:   Patient will meet greater than or equal to 90% of their needs    MONITOR:   PO intake, Weight trends, Labs, I & O's, Supplement acceptance, Skin  REASON FOR ASSESSMENT:   Malnutrition Screening Tool    ASSESSMENT:  86 year old admitted for DVT after presenting with worsening lower extremity swelling and complaints of chest discomfort with cough. Past medical history significant of lung cancer on chemotherapy, recent diagnosis of DVT, HTN, COPD, AAA s/p repair, history of left breast cancer s/p mastectomy, and HTN.  Very delightful patient awake, alert sitting up in bed this morning at RD visit. Patient reports decreased appetite and energy beginning in April after being diagnosed with lung cancer. Patient reports that she is very active at baseline, shares that she had plans to go zip lining with girlfriends, which was unfortunately cancelled due to pandemic. Patient reports that she was to start [facility] when she was diagnosed with DVT. Patient recalls usually eating 3 meals/day with snacks. She recalls a cup of coffee and mini muffin split with her dog for breakfast, assorted sandwiches for lunch such as chicken or ham topped with lettuce and lots of mayonnaise and a coke, toasted English muffin with butter for afternoon snack and recalls a balanced dinner with a protein, a potato/rice, and vegetable. Encouraged patient to consume daily nutrition supplement at home with decreased appetite,  amenable to receiving chocolate Ensure during admission.   Current wt 197.78 lb, moderate pitting RLE; deep pitting LLE edema noted per RN assessment. Weight history reviewed, stable  over the past 2 months.  Medications reviewed and include: D3, B12 IV Heparin 7.5 ml/hr Labs reviewed  NUTRITION - FOCUSED PHYSICAL EXAM: Mild fat depletion to orbital and buccal regions; mild muscle depletion to temple and dorsal hand regions   Diet Order:   Diet Order            Diet NPO time specified  Diet effective midnight        Diet 2 gram sodium Room service appropriate? Yes; Fluid consistency: Thin  Diet effective now              EDUCATION NEEDS:   No education needs have been identified at this time  Skin:  Skin Assessment: Skin Integrity Issues: Skin Integrity Issues:: Other (Comment) Other: serous blister; left lower leg  Last BM:  5/30  Height:   Ht Readings from Last 1 Encounters:  07/04/19 5\' 2"  (1.575 m)    Weight:   Wt Readings from Last 1 Encounters:  07/04/19 89.9 kg   BMI:  Body mass index is 36.25 kg/m.  Estimated Nutritional Needs:   Kcal:  1700-1900  Protein:  80-90  Fluid:  >= 1.6 L   Lajuan Lines, RD, LDN Clinical Nutrition After Hours/Weekend Pager # in [place]

## 2019-07-05 NOTE — Plan of Care (Signed)
  Problem: Skin Integrity: Goal: Risk for impaired skin integrity will decrease Outcome: Progressing   BLE edema will decrease before discharge, and pt will have feeling on her left leg, reported during assessment she does not have feeling on her left leg

## 2019-07-06 ENCOUNTER — Encounter
Admission: EM | Disposition: A | Discharge status: Skilled Nursing Facility | Payer: Self-pay | Source: Home / Self Care | Attending: Internal Medicine

## 2019-07-06 DIAGNOSIS — J430 Unilateral pulmonary emphysema [MacLeod's syndrome]: Secondary | ICD-10-CM

## 2019-07-06 DIAGNOSIS — D649 Anemia, unspecified: Secondary | ICD-10-CM

## 2019-07-06 DIAGNOSIS — I10 Essential (primary) hypertension: Secondary | ICD-10-CM

## 2019-07-06 DIAGNOSIS — I82402 Acute embolism and thrombosis of unspecified deep veins of left lower extremity: Secondary | ICD-10-CM

## 2019-07-06 HISTORY — PX: PERIPHERAL VASCULAR THROMBECTOMY: CATH118306

## 2019-07-06 LAB — COMPREHENSIVE METABOLIC PANEL
ALT: 6 U/L (ref 0–44)
AST: 17 U/L (ref 15–41)
Albumin: 3.4 g/dL — ABNORMAL LOW (ref 3.5–5.0)
Alkaline Phosphatase: 53 U/L (ref 38–126)
Anion gap: 11 (ref 5–15)
BUN: 21 mg/dL (ref 8–23)
CO2: 30 mmol/L (ref 22–32)
Calcium: 9.1 mg/dL (ref 8.9–10.3)
Chloride: 97 mmol/L — ABNORMAL LOW (ref 98–111)
Creatinine, Ser: 1.04 mg/dL — ABNORMAL HIGH (ref 0.44–1.00)
GFR calc Af Amer: 54 mL/min — ABNORMAL LOW (ref >60)
GFR calc non Af Amer: 46 mL/min — ABNORMAL LOW (ref >60)
Glucose, Bld: 117 mg/dL — ABNORMAL HIGH (ref 70–99)
Potassium: 4 mmol/L (ref 3.5–5.1)
Sodium: 138 mmol/L (ref 135–145)
Total Bilirubin: 0.9 mg/dL (ref 0.3–1.2)
Total Protein: 6.3 g/dL — ABNORMAL LOW (ref 6.5–8.1)

## 2019-07-06 LAB — CBC WITH DIFFERENTIAL/PLATELET
Abs Immature Granulocytes: 0.06 10*3/uL (ref 0.00–0.07)
Basophils Absolute: 0 10*3/uL (ref 0.0–0.1)
Basophils Relative: 1 %
Eosinophils Absolute: 0.2 10*3/uL (ref 0.0–0.5)
Eosinophils Relative: 4 %
HCT: 36 % (ref 36.0–46.0)
Hemoglobin: 11.3 g/dL — ABNORMAL LOW (ref 12.0–15.0)
Immature Granulocytes: 1 %
Lymphocytes Relative: 10 %
Lymphs Abs: 0.6 10*3/uL — ABNORMAL LOW (ref 0.7–4.0)
MCH: 29.1 pg (ref 26.0–34.0)
MCHC: 31.4 g/dL (ref 30.0–36.0)
MCV: 92.8 fL (ref 80.0–100.0)
Monocytes Absolute: 0.7 10*3/uL (ref 0.1–1.0)
Monocytes Relative: 13 %
Neutro Abs: 3.9 10*3/uL (ref 1.7–7.7)
Neutrophils Relative %: 71 %
Platelets: 183 10*3/uL (ref 150–400)
RBC: 3.88 MIL/uL (ref 3.87–5.11)
RDW: 16.7 % — ABNORMAL HIGH (ref 11.5–15.5)
WBC: 5.4 10*3/uL (ref 4.0–10.5)
nRBC: 0 % (ref 0.0–0.2)

## 2019-07-06 LAB — MAGNESIUM: Magnesium: 2.1 mg/dL (ref 1.7–2.4)

## 2019-07-06 LAB — PHOSPHORUS: Phosphorus: 3.4 mg/dL (ref 2.5–4.6)

## 2019-07-06 LAB — APTT
aPTT: 119 seconds — ABNORMAL HIGH (ref 24–36)
aPTT: 63 seconds — ABNORMAL HIGH (ref 24–36)

## 2019-07-06 LAB — HEPARIN LEVEL (UNFRACTIONATED): Heparin Unfractionated: >3.6 IU/mL — ABNORMAL HIGH (ref 0.30–0.70)

## 2019-07-06 SURGERY — PERIPHERAL VASCULAR THROMBECTOMY
Anesthesia: Moderate Sedation | Laterality: Left

## 2019-07-06 MED ORDER — METHYLPREDNISOLONE SODIUM SUCC 125 MG IJ SOLR: 125.0000 mg | Freq: Once | INTRAMUSCULAR | Status: DC | PRN

## 2019-07-06 MED ORDER — MIDAZOLAM HCL 2 MG/ML PO SYRP: 8.0000 mg | ORAL_SOLUTION | Freq: Once | ORAL | Status: DC | PRN

## 2019-07-06 MED ORDER — DIPHENHYDRAMINE HCL 50 MG/ML IJ SOLN: 50.0000 mg | Freq: Once | INTRAMUSCULAR | Status: DC | PRN

## 2019-07-06 MED ORDER — ONDANSETRON HCL 4 MG/2ML IJ SOLN: 4.0000 mg | Freq: Four times a day (QID) | INTRAMUSCULAR | Status: DC | PRN

## 2019-07-06 MED ORDER — CLINDAMYCIN PHOSPHATE 300 MG/50ML IV SOLN
INTRAVENOUS | Status: AC
  Filled 2019-07-06: qty 50

## 2019-07-06 MED ORDER — MIDAZOLAM HCL 5 MG/5ML IJ SOLN
INTRAMUSCULAR | Status: AC
  Filled 2019-07-06: qty 5

## 2019-07-06 MED ORDER — SODIUM CHLORIDE 0.9 % IV SOLN: INTRAVENOUS | Status: DC

## 2019-07-06 MED ORDER — FENTANYL CITRATE (PF) 100 MCG/2ML IJ SOLN
INTRAMUSCULAR | Status: AC
  Filled 2019-07-06: qty 2

## 2019-07-06 MED ORDER — MIDAZOLAM HCL 2 MG/ML PO SYRP
4.0000 mg | ORAL_SOLUTION | Freq: Once | ORAL | Status: AC
  Administered 2019-07-06: 4 mg via ORAL

## 2019-07-06 MED ORDER — HEPARIN SODIUM (PORCINE) 1000 UNIT/ML IJ SOLN
INTRAMUSCULAR | Status: AC
  Filled 2019-07-06: qty 1

## 2019-07-06 MED ORDER — FENTANYL CITRATE (PF) 100 MCG/2ML IJ SOLN
INTRAMUSCULAR | Status: DC | PRN
  Administered 2019-07-06: 25 ug via INTRAVENOUS
  Administered 2019-07-06 (×2): 50 ug via INTRAVENOUS

## 2019-07-06 MED ORDER — MIDAZOLAM HCL 2 MG/ML PO SYRP
ORAL_SOLUTION | ORAL | Status: AC
  Filled 2019-07-06: qty 4

## 2019-07-06 MED ORDER — HEPARIN (PORCINE) 25000 UT/250ML-% IV SOLN
600.0000 [IU]/h | INTRAVENOUS | Status: DC
  Administered 2019-07-06: 600 [IU]/h via INTRAVENOUS

## 2019-07-06 MED ORDER — MIDAZOLAM HCL 2 MG/2ML IJ SOLN
INTRAMUSCULAR | Status: DC | PRN
  Administered 2019-07-06 (×3): 1 mg via INTRAVENOUS

## 2019-07-06 MED ORDER — HEPARIN (PORCINE) 25000 UT/250ML-% IV SOLN
700.0000 [IU]/h | INTRAVENOUS | Status: DC
  Administered 2019-07-06: 600 [IU]/h via INTRAVENOUS

## 2019-07-06 MED ORDER — HEPARIN SODIUM (PORCINE) 1000 UNIT/ML IJ SOLN
INTRAMUSCULAR | Status: DC | PRN
  Administered 2019-07-06: 3000 [IU] via INTRAVENOUS

## 2019-07-06 MED ORDER — IODIXANOL 320 MG/ML IV SOLN
INTRAVENOUS | Status: DC | PRN
  Administered 2019-07-06: 20 mL via INTRAVENOUS

## 2019-07-06 MED ORDER — CLINDAMYCIN PHOSPHATE 300 MG/50ML IV SOLN
300.0000 mg | Freq: Once | INTRAVENOUS | Status: DC
  Filled 2019-07-06: qty 50

## 2019-07-06 MED ORDER — HYDROMORPHONE HCL 1 MG/ML IJ SOLN: 1.0000 mg | Freq: Once | INTRAMUSCULAR | Status: DC | PRN

## 2019-07-06 MED ORDER — FAMOTIDINE 20 MG PO TABS: 40.0000 mg | ORAL_TABLET | Freq: Once | ORAL | Status: DC | PRN

## 2019-07-06 SURGICAL SUPPLY — 11 items
BALLN MUSTANG 10X60X75 (BALLOONS) ×2
BALLOON MUSTANG 10X60X75 (BALLOONS) IMPLANT
CANISTER PENUMBRA ENGINE (MISCELLANEOUS) ×1 IMPLANT
CANNULA 5F STIFF (CANNULA) ×1 IMPLANT
CATH BEACON 5 .035 65 KMP TIP (CATHETERS) ×1 IMPLANT
CATH INDIGO 12XTORQ 100 (CATHETERS) ×1 IMPLANT
DEVICE PRESTO INFLATION (MISCELLANEOUS) ×1 IMPLANT
GLIDEWIRE ADV .035X180CM (WIRE) ×1 IMPLANT
PACK ANGIOGRAPHY (CUSTOM PROCEDURE TRAY) ×1 IMPLANT
SHEATH PINNACLE 11FRX10 (SHEATH) ×1 IMPLANT
WIRE J 3MM .035X145CM (WIRE) ×1 IMPLANT

## 2019-07-06 NOTE — H&P (Signed)
[facility] History & Physical Update  The patient was interviewed and re-examined.  The patient's previous History and Physical has been reviewed and is unchanged.  There is no change in the plan of care. We plan to proceed with the scheduled procedure.  Leotis Pain, MD  07/06/2019, 10:21 AM

## 2019-07-06 NOTE — Op Note (Signed)
[facility]   OPERATIVE NOTE   PRE-OPERATIVE DIAGNOSIS: extensive LLE DVT  POST-OPERATIVE DIAGNOSIS: same   PROCEDURE: 1. US guidance for vascular access to left popliteal vein 2. Catheter placement into left common iliac vein from left popliteal approach 3. IVC gram and left lower extremity venogram 4. Mechanical thrombectomy to proximal left superficial femoral vein, common femoral vein, external and common iliac veins with the penumbra CAT 12 device 5. PTA of left external and common iliac veins with 10 mm diameter angioplasty balloon    SURGEON: Leotis Pain, MD  ASSISTANT(S): none  ANESTHESIA: local with moderate conscious sedation for 30 minutes using 3 mg of Versed and 125 mcg of Fentanyl  ESTIMATED BLOOD LOSS: 200 cc  FINDING(S): 1. Extensive occlusive thrombus starting in the proximal superficial femoral vein and extending through the common femoral vein and iliac veins.  The IVC was patent.  The popliteal vein and distal and mid superficial femoral veins were patent.  SPECIMEN(S): none  INDICATIONS:  Patient is a 86 y.o. female who presents with an extensive left lower extremity DVT with progression on anticoagulation. Patient has marked leg swelling and pain. Venous intervention is performed to reduce the symtpoms and avoid long term postphlebitic symptoms.   DESCRIPTION: After obtaining full informed written consent, the patient was brought back to the vascular suite and placed supine upon the table.Moderate conscious sedation was administered during a face to face encounter with the patient throughout the procedure with my supervision of the RN administering medicines and monitoring the patient's vital signs, pulse oximetry, telemetry and mental status throughout from the start of the procedure until the patient was taken to the recovery room. After obtaining adequate anesthesia, the patient was prepped and draped in the standard  fashion. The patient was then placed into the prone position. The left popliteal vein was then accessed under direct ultrasound guidance without difficulty with a micropuncture needle and a permanent image was recorded. I then upsized to an 11Fr sheath over a J wire. 3000 units of heparin were then given. Imaging showed extensive DVT with minimal flow starting in the proximal superficial femoral vein and extending through the common femoral vein and iliac veins. A Kumpe catheter and advantage tourque wire were then advanced into the CFV and images were performed. Complete thrombosis was seen I was able to cross the thrombus and stenosis and advance into the left common iliac vein which still had thrombus present but the IVC was imaged and was patent. I elected not to use thrombolytic therapy given her advanced age, advanced malignancy, and recent hematoma with angiogram 2 weeks ago.  I used the Penumbra Cat 12 catheter and evacuated about 200 of effluent with mechanical thrombectomy throughout the iliac veins, CFV, and proximal SFV.  The popliteal vein and the mid and distal superficial femoral vein did not have any significant thrombus present. This had marked improvement. The superficial femoral vein and common femoral vein were now widely patent without no thrombus. The thrombus in the iliac veins was almost entirely removed but there was significant narrowing in the external iliac vein and extending into the distal common iliac vein creating greater than 50% stenosis.  I then turned my attention to the iliac veins. The stenosis/occlusion and small amount of residual thrombus was treated with a 10 mm diameter angioplasty balloon.  2 inflations with 10 mm diameter by 6 cm length angioplasty balloon were done in the external iliac vein and the distal common iliac vein  on the left.  Following this, there was mild to moderate residual stenosis in the 30 to 40% range but I elected not to stent this area  due to the high likelihood of thrombosis with stent placement in a patient with an active malignancy.  Her flow was markedly improved.  I then elected to terminate the procedure. The sheath was removed and a dressing was placed. She was taken to the recovery room in stable condition having tolerated the procedure well.   COMPLICATIONS: None  CONDITION: Stable  Leotis Pain 07/06/2019 12:54 PM

## 2019-07-06 NOTE — Progress Notes (Signed)
   07/06/19 0849  Clinical Encounter Type  Visited With Patient  Visit Type Initial  Referral From Nurse  Consult/Referral To Chaplain  Spiritual Encounters  Spiritual Needs Prayer;Emotional;Other (Comment)  [place] completed visit after being informed by [facility] of pt's desire for prayer before her procedure. Met pt in the special procedure unit and read Scripture and prayed for patient. Pastoral visit was appreciated. No further needs expressed at this time.
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
## 2019-07-06 NOTE — Progress Notes (Signed)
PROGRESS NOTE    Gail Nelson  SWF:[id number] DOB: 02-Apr-1923 DOA: 07/04/2019 PCP: Lavera Guise, MD  Brief Narrative:  HPI per Dr. Collier Bullock on 07/04/19 Gail Nelson is a 86 y.o. female with medical of lung cancer significant for lung cancer (recently diagnosed during chemotherapy), hypertension, COPD, abdominal aortic aneurysm status post repair and DVT on Eliquis who presents to the ED with complaints of left leg swelling.  Patient had an endoleak following AAA repair and had a hematoma involving her right lower extremity but that seems to have improved.  She was started on Eliquis for her DVT and follow-up ultrasound shows progression of DVT now showing complete occlusion of the common femoral vein.  Patient complains of chest discomfort which she describes as a pleuritic pain that occurs when she coughs.   Patient had a CT angiogram which shows no evidence acute pulmonary embolism. Chronic large RIGHT pleural effusion with passive atelectasis of the RIGHT lung. Left leg venous Doppler shows in comparison with recent study, there is now essentially complete occlusion of the left common femoral vein and left saphenofemoral junction regions. Thrombus is no longer appreciable in the left profunda femoral vein. There has been likely propagation of acute deep venous thrombosis more proximally into the left common femoral junction region. Note that this circumstance places patient at significant increased risk for pulmonary embolus development. She denies having any fever or chills, no cough, no dizziness or lightheadedness, no urinary symptoms or changes in her bowel habits.  ED Course: 86 year old female with a history of lung cancer and recently diagnosed DVT who presents to the emergency room for worsening lower extremity swelling.  Patient had a left leg venous Doppler which showed In comparison with recent study, there is now essentially complete occlusion of the left common  femoral vein and left saphenofemoral junction regions. Thrombus is no longer appreciable in the left profunda femoral vein. There has been likely propagation of acute deep venous thrombosis more proximally into the left common femoral junction region. Note that this circumstance places patient at significant increased risk for pulmonary embolus development. Visualized proximal inferior vena cava appears patent without evident thrombus.  **Interim History Because of her persistent symptoms she underwent a mechanical thrombectomy with placement of an IVC filter.  Medical oncology was consulted for further anticoagulation recommendations and at their recommendation palliative care was also consulted for goals of care discussion.  Assessment & Plan:   Principal Problem:   DVT (deep venous thrombosis) (HCC) Active Problems:   Chronic obstructive pulmonary disease (HCC)   Benign hypertension   Primary malignant neoplasm of right lower lobe of lung (HCC)  LEFT Leg DVT -Failed anticoagulant therapy (Eliquis); DVT has extended and fully occluded left lower extremity common femoral vein and left saphenofemoral -Evaluated by Dr. Harold Barban vascular surgery on 5/31.  Agrees with starting patient on heparin see note  -Vascular Surgery took the patient for undergoing mechanical thrombectomy with placement of an IVC filter on 6/2 due to continued LLE Pain and swelling; See below. Dr Lucky Cowboy preformed the procedure  -Per Vascular unable to use TPA due to the patient having cancer given increased risk of bleeding -Vascular Surgery recommending continuing Heparin and transitioning back to Eliquis post procedure but will discuss with Oncology about Anticoagulation recc's -C/w Heparin gtt for now and Dr. Rogue Bussing he personally feels that she will need Lovenox Injections but feels that family and patient may not be amenable. He feels that a [place] discussion should be  had including hospice discussion -Dr.  Rogue Bussing to see the patient in the a.m. and he is also recommended palliative care consultation which is also been placed  RIGHT Lower Lobe stage IV Adenocarcinoma lung cancer -Per Dr.Govinda Brahmanday Oncology note from 06/20/2019 patient s/p radiation treatment 06/09/2019.  Also received Keytruda but was placed on hold secondary to side effects. -Has Chronic Large Right Pleural Effusion with Passive Atelectasis of the Right Lung -Patient was to follow-up in 3 weeks and will discuss with Dr. Rogue Bussing and he will see the patient in the a.m. -Continue with supportive care  Hypertension -C/w Coreg 3.125 mg BID -C/w Losartan 50 mg daily  CKD Stage 3a -Patient's BUN/Cr has remained relatively stable and today is now 21/1.04 -Continue to monitor and trend -He is getting normal saline at 75 mL's per hour will stop after 16 more hours and avoidance of volume overloading her -Avoid Nephrotoxic Medications, Contrast Dyes, Hypotension and Renally adjust Medications -Repeat CMP in the AM   AAA s/p Repair -Follow-up in the outpatient setting  COPD -Continue as needed bronchodilator therapy with albuterol 2.5 mg nebs every 6 hours as needed for wheezing as well as mometasone-formoterol 2 puffs elation twice daily  Normocytic Anemia -Patient's Hgb/Hct went from 11.9/36.9 and is now 11.3/36.0 -Check Anemia Panel in the AM -Continue to Monitor for S/Sx of Bleeding as she is Anticoagulated with Heparin gtt; Currently no overt bleeding noted -Repeat CBC in AM  Obesity -Estimated body mass index is 36.25 kg/m as calculated from the following:   Height as of this encounter: 5\' 2"  (1.575 m).   Weight as of this encounter: 89.9 kg. -Weight Loss and Dietary Counseling given  -Nutritionist consulted and recommending Ensure Enlive po BID  GOC: DNR, poA -Palliative care consulted for further evaluation recommendations given medical oncology recommendations -Likely will need hospice discussion at  some point given Oncology's concerned that she is not going to do well long-term  DVT prophylaxis: Anticoagulated with Heparin gtt Code Status: DO NOT RESUSCITATE Family Communication: No family present at bedside  Disposition Plan: Remain inpatient for further evaluation recommendations vascular surgery as well as medical oncology  Status is: Inpatient  Remains inpatient appropriate because:Ongoing diagnostic testing needed not appropriate for outpatient work up, Unsafe d/c plan, IV treatments appropriate due to intensity of illness or inability to take PO and Inpatient level of care appropriate due to severity of illness   Dispo: The patient is from: Home              Anticipated d/c is to: SNF              Anticipated d/c date is: 2 days              Patient currently is not medically stable to d/c.  Consultants:   Vascular Surgery Dr. Harold Barban and Dr. Lucky Cowboy  Medical Oncology Dr. Rogue Bussing  Palliative Care Medicine    Procedures:  CTA   LLE VENOUS DUPLEX 07/04/19 IMPRESSION: In comparison with recent study, there is now essentially complete occlusion of the left common femoral vein and left saphenofemoral junction regions. Thrombus is no longer appreciable in the left profunda femoral vein. There has been likely propagation of acute deep venous thrombosis more proximally into the left common femoral junction region. Note that this circumstance places patient at significant increased risk for pulmonary embolus development.  Visualized proximal inferior vena cava appears patent without evident thrombus.  PROCEDURE 07/06/19 by Dr. Lucky Cowboy 1. US guidance for  vascular access to left popliteal vein 2. Catheter placement into left common iliac vein from left popliteal approach 3. IVC gram and left lower extremity venogram 4. Mechanical thrombectomy to proximal left superficial femoral vein, common femoral vein, external and common iliac veins with the penumbra CAT 12  device 5. PTA of left external and common iliac veins with 10 mm diameter angioplasty balloon   Antimicrobials: Anti-infectives (From admission, onward)   None     Subjective: Patient seen and examined at bedside after her thrombectomy she states that she is doing okay.  Still felt a little sleepy.  No nausea or vomiting.  Denies any lightheadedness or dizziness.  States the pain in the legs is not as severe as what it was.  Feels better.  No other concerns or complaints at this time.  Objective: Vitals:   07/05/19 0418 07/05/19 0827 07/05/19 1546 07/05/19 2159  BP: (!) 156/77 137/61 125/72 (!) 153/70  Pulse: 92 81 78 87  Resp: 16 20 16 16   Temp: 98.2 F (36.8 C) 97.7 F (36.5 C) 97.9 F (36.6 C) 98.6 F (37 C)  TempSrc: Oral Oral Oral Oral  SpO2: 94% 93% 96% 95%  Weight:      Height:        Intake/Output Summary (Last 24 hours) at 07/06/2019 9476 Last data filed at 07/06/2019 0300 Gross per 24 hour  Intake 793.04 ml  Output --  Net 793.04 ml   Filed Weights   07/04/19 1103  Weight: 89.9 kg   Examination: Physical Exam:  Constitutional: WN/WD obese Caucasian female currently in NAD and appears calm  Eyes: Lids and conjunctivae normal, sclerae anicteric  ENMT: External Ears, Nose appear normal. Grossly normal hearing.  Neck: Appears normal, supple, no cervical masses, normal ROM, no appreciable thyromegaly: No JVD Respiratory: Diminished to auscultation bilaterally with coarse breath sounds and some crackles on the right compared to left; no appreciable wheezing, rales.  She has unlabored breathing but is wearing supplemental oxygen via nasal cannula Cardiovascular: RRR, no murmurs / rubs / gallops. S1 and S2 auscultated. 1+  extremity edema and has lower extremity bruising on the right leg Abdomen: Soft, non-tender, distended secondary body habitus. Bowel sounds positive.  GU: Deferred. Musculoskeletal: No clubbing / cyanosis of digits/nails. No joint deformity upper  and lower extremities.  Skin: No rashes, lesions, ulcers but right lower extremity is wrapped and left extremity is mildly swollen with some bruising and she does have tattoo on her left leg.. No induration; Warm and dry.  Neurologic: CN 2-12 grossly intact with no focal deficits. Romberg sign and cerebellar reflexes not assessed.  Psychiatric: Normal judgment and insight. Alert and oriented x 3. Normal mood and appropriate affect.   Data Reviewed: I have personally reviewed following labs and imaging studies  CBC: Recent Labs  Lab 07/04/19 1243 07/05/19 0419 07/06/19 0159  WBC 7.0 6.5 5.4  NEUTROABS 5.6  --  3.9  HGB 11.9* 11.9* 11.3*  HCT 36.9 37.5 36.0  MCV 90.7 90.1 92.8  PLT 181 203 546   Basic Metabolic Panel: Recent Labs  Lab 07/04/19 1243 07/05/19 0419 07/06/19 0159  NA 135 138 138  K 4.5 4.2 4.0  CL 95* 96* 97*  CO2 30 31 30   GLUCOSE 118* 108* 117*  BUN 25* 21 21  CREATININE 1.13* 0.91 1.04*  CALCIUM 9.2 9.2 9.1  MG  --   --  2.1  PHOS  --   --  3.4   GFR: Estimated Creatinine  Clearance: 35.2 mL/min (A) (by C-G formula based on SCr of 1.04 mg/dL (H)). Liver Function Tests: Recent Labs  Lab 07/06/19 0159  AST 17  ALT 6  ALKPHOS 53  BILITOT 0.9  PROT 6.3*  ALBUMIN 3.4*   No results for input(s): LIPASE, AMYLASE in the last 168 hours. No results for input(s): AMMONIA in the last 168 hours. Coagulation Profile: Recent Labs  Lab 07/04/19 1243  INR 1.8*   Cardiac Enzymes: No results for input(s): CKTOTAL, CKMB, CKMBINDEX, TROPONINI in the last 168 hours. BNP (last 3 results) No results for input(s): PROBNP in the last 8760 hours. HbA1C: No results for input(s): HGBA1C in the last 72 hours. CBG: No results for input(s): GLUCAP in the last 168 hours. Lipid Profile: No results for input(s): CHOL, HDL, LDLCALC, TRIG, CHOLHDL, LDLDIRECT in the last 72 hours. Thyroid Function Tests: No results for input(s): TSH, T4TOTAL, FREET4, T3FREE, THYROIDAB in  the last 72 hours. Anemia Panel: No results for input(s): VITAMINB12, FOLATE, FERRITIN, TIBC, IRON, RETICCTPCT in the last 72 hours. Sepsis Labs: No results for input(s): PROCALCITON, LATICACIDVEN in the last 168 hours.  Recent Results (from the past 240 hour(s))  SARS Coronavirus 2 by RT PCR (hospital order, [facility]) Nasopharyngeal Nasopharyngeal Swab     Status: None   Collection Time: 07/04/19  3:54 PM   Specimen: Nasopharyngeal Swab  Result Value Ref Range Status   SARS Coronavirus 2 NEGATIVE NEGATIVE Final    Comment: (NOTE) SARS-CoV-2 target nucleic acids are NOT DETECTED. The SARS-CoV-2 RNA is generally detectable in upper and lower respiratory specimens during the acute phase of infection. The lowest concentration of SARS-CoV-2 viral copies this assay can detect is 250 copies / mL. A negative result does not preclude SARS-CoV-2 infection and should not be used as the sole basis for treatment or other patient management decisions.  A negative result may occur with improper specimen collection / handling, submission of specimen other than nasopharyngeal swab, presence of viral mutation(s) within the areas targeted by this assay, and inadequate number of viral copies (<250 copies / mL). A negative result must be combined with clinical observations, patient history, and epidemiological information. Fact Sheet for Patients:   StrictlyIdeas.no Fact Sheet for Healthcare Providers: BankingDealers.co.za This test is not yet approved or cleared  by the [place] FDA and has been authorized for detection and/or diagnosis of SARS-CoV-2 by FDA under an Emergency Use Authorization (EUA).  This EUA will remain in effect (meaning this test can be used) for the duration of the COVID-19 declaration under Section 564(b)(1) of the Act, 21 U.S.C. section 360bbb-3(b)(1), unless the authorization is terminated  or revoked sooner. Performed at [facility], [address] [place], [place] [address]      RN Pressure Injury Documentation:     Estimated body mass index is 36.25 kg/m as calculated from the following:   Height as of this encounter: 5\' 2"  (1.575 m).   Weight as of this encounter: 89.9 kg.  Malnutrition Type:  Nutrition Problem: Increased nutrient needs Etiology: cancer and cancer related treatments   Malnutrition Characteristics:  Signs/Symptoms: estimated needs   Nutrition Interventions:  Interventions: Ensure Enlive (each supplement provides 350kcal and 20 grams of protein)   Radiology Studies: CT Angio Chest PE W/Cm &/Or Wo Cm  Result Date: 07/04/2019 CLINICAL DATA:  LEFT leg swelling.  Short of breath. EXAM: CT ANGIOGRAPHY CHEST WITH CONTRAST TECHNIQUE: Multidetector CT imaging of the chest was performed using the  standard protocol during bolus administration of intravenous contrast. Multiplanar CT image reconstructions and MIPs were obtained to evaluate the vascular anatomy. CONTRAST:  65mL OMNIPAQUE IOHEXOL 350 MG/ML SOLN COMPARISON:  Doppler ultrasound lower extremities 07/04/2019, CT angiogram chest 06/10/2019. FINDINGS: Cardiovascular: No filling defects within the pulmonary arteries to suggest acute pulmonary embolism. Coronary artery calcification and aortic atherosclerotic calcification. Mediastinum/Nodes: No axillary supraclavicular adenopathy. No mediastinal hilar adenopathy no pericardial effusion. Esophagus normal Lungs/Pleura: Large layering RIGHT pleural effusion similar to comparison CT. There is passive atelectasis of the RIGHT lower lobe. No pulmonary infarction identified. Atelectasis within the RIGHT middle lobe is also similar prior. Upper Abdomen: Limited view of the liver, kidneys, pancreas are unremarkable. Normal adrenal glands. Musculoskeletal: No aggressive osseous lesion. Review of the MIP images confirms the above findings. IMPRESSION: 1.  No evidence acute pulmonary embolism. 2. Chronic large RIGHT pleural effusion with passive atelectasis of the RIGHT lung. Electronically Signed   By: Suzy Bouchard M.D.   On: 07/04/2019 14:35   US Venous Img Lower Unilateral Left  Result Date: 07/04/2019 CLINICAL DATA:  Soft tissue swelling EXAM: LEFT LOWER EXTREMITY VENOUS DUPLEX ULTRASOUND TECHNIQUE: Gray-scale sonography with graded compression, as well as color Doppler and duplex ultrasound were performed to evaluate the left lower extremity deep venous system from the level of the common femoral vein and including the common femoral, femoral, profunda femoral, popliteal and calf veins including the posterior tibial, peroneal and gastrocnemius veins when visible. The superficial great saphenous vein was also interrogated. Spectral Doppler was utilized to evaluate flow at rest and with distal augmentation maneuvers in the common femoral, femoral and popliteal veins. COMPARISON:  Jul 01, 2019 FINDINGS: Contralateral Common Femoral Vein: There is acute appearing thrombus in the left common femoral vein with loss of compressibility and Doppler signal. Common Femoral Vein: There is absence of flow at the left saphenofemoral junction with loss of apparent flow and compressibility. Saphenofemoral Junction: No evidence of thrombus. Normal compressibility and flow on color Doppler imaging. Profunda Femoral Vein: No evidence of thrombus. Normal compressibility and flow on color Doppler imaging. Femoral Vein: No evidence of thrombus. Normal compressibility, respiratory phasicity and response to augmentation. Popliteal Vein: No evidence of thrombus. Normal compressibility, respiratory phasicity and response to augmentation. Calf Veins: No thrombus evident in the posterior tibial vein. There is incomplete obstruction of the left peroneal vein with diminished flow and diminished compressibility. Superficial Great Saphenous Vein: No evidence of thrombus. Normal  compressibility. Venous Reflux:  None. Other Findings:  Patency of the proximal inferior vena cava noted. IMPRESSION: In comparison with recent study, there is now essentially complete occlusion of the left common femoral vein and left saphenofemoral junction regions. Thrombus is no longer appreciable in the left profunda femoral vein. There has been likely propagation of acute deep venous thrombosis more proximally into the left common femoral junction region. Note that this circumstance places patient at significant increased risk for pulmonary embolus development. Visualized proximal inferior vena cava appears patent without evident thrombus. Electronically Signed   By: Lowella Grip III M.D.   On: 07/04/2019 12:08   Scheduled Meds:  carvedilol  3.125 mg Oral BID   cholecalciferol  1,000 Units Oral Daily   feeding supplement (ENSURE ENLIVE)  237 mL Oral BID BM   losartan  50 mg Oral Daily   mometasone-formoterol  2 puff Inhalation BID   sodium chloride flush  3 mL Intravenous Q12H   vitamin B-12  1,000 mcg Oral Daily   Continuous Infusions:  sodium chloride     sodium chloride 75 mL/hr at 07/06/19 0300   heparin 600 Units/hr (07/06/19 0626)    LOS: 2 days   Kerney Elbe, DO [facility] PAGER is on AMION  If 7PM-7AM, please contact night-coverage [email or web address]

## 2019-07-06 NOTE — Plan of Care (Signed)
  Problem: Skin Integrity: Goal: Risk for impaired skin integrity will decrease Outcome: Progressing   

## 2019-07-06 NOTE — Progress Notes (Signed)
Patient requested visit from [place] before procedure. Chaplain paged and is at bedside with patient at this time.
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
## 2019-07-07 ENCOUNTER — Encounter: Payer: Self-pay | Admitting: Cardiology

## 2019-07-07 ENCOUNTER — Inpatient Hospital Stay: Payer: Medicare Other

## 2019-07-07 DIAGNOSIS — Z515 Encounter for palliative care: Secondary | ICD-10-CM

## 2019-07-07 DIAGNOSIS — J90 Pleural effusion, not elsewhere classified: Secondary | ICD-10-CM

## 2019-07-07 DIAGNOSIS — R531 Weakness: Secondary | ICD-10-CM

## 2019-07-07 DIAGNOSIS — I82412 Acute embolism and thrombosis of left femoral vein: Principal | ICD-10-CM

## 2019-07-07 DIAGNOSIS — Z66 Do not resuscitate: Secondary | ICD-10-CM

## 2019-07-07 DIAGNOSIS — C3431 Malignant neoplasm of lower lobe, right bronchus or lung: Secondary | ICD-10-CM

## 2019-07-07 LAB — RETICULOCYTES
Immature Retic Fract: 13 % (ref 2.3–15.9)
RBC.: 3.63 MIL/uL — ABNORMAL LOW (ref 3.87–5.11)
Retic Count, Absolute: 93.3 10*3/uL (ref 19.0–186.0)
Retic Ct Pct: 2.6 % (ref 0.4–3.1)

## 2019-07-07 LAB — CBC WITH DIFFERENTIAL/PLATELET
Abs Immature Granulocytes: 0.07 10*3/uL (ref 0.00–0.07)
Basophils Absolute: 0 10*3/uL (ref 0.0–0.1)
Basophils Relative: 0 %
Eosinophils Absolute: 0.2 10*3/uL (ref 0.0–0.5)
Eosinophils Relative: 3 %
HCT: 33.9 % — ABNORMAL LOW (ref 36.0–46.0)
Hemoglobin: 10.6 g/dL — ABNORMAL LOW (ref 12.0–15.0)
Immature Granulocytes: 1 %
Lymphocytes Relative: 10 %
Lymphs Abs: 0.5 10*3/uL — ABNORMAL LOW (ref 0.7–4.0)
MCH: 28.9 pg (ref 26.0–34.0)
MCHC: 31.3 g/dL (ref 30.0–36.0)
MCV: 92.4 fL (ref 80.0–100.0)
Monocytes Absolute: 0.6 10*3/uL (ref 0.1–1.0)
Monocytes Relative: 12 %
Neutro Abs: 3.8 10*3/uL (ref 1.7–7.7)
Neutrophils Relative %: 74 %
Platelets: 179 10*3/uL (ref 150–400)
RBC: 3.67 MIL/uL — ABNORMAL LOW (ref 3.87–5.11)
RDW: 16.4 % — ABNORMAL HIGH (ref 11.5–15.5)
WBC: 5.2 10*3/uL (ref 4.0–10.5)
nRBC: 0 % (ref 0.0–0.2)

## 2019-07-07 LAB — APTT: aPTT: 46 seconds — ABNORMAL HIGH (ref 24–36)

## 2019-07-07 LAB — MAGNESIUM: Magnesium: 2 mg/dL (ref 1.7–2.4)

## 2019-07-07 LAB — FERRITIN: Ferritin: 110 ng/mL (ref 11–307)

## 2019-07-07 LAB — COMPREHENSIVE METABOLIC PANEL
ALT: 6 U/L (ref 0–44)
AST: 16 U/L (ref 15–41)
Albumin: 3.3 g/dL — ABNORMAL LOW (ref 3.5–5.0)
Alkaline Phosphatase: 50 U/L (ref 38–126)
Anion gap: 10 (ref 5–15)
BUN: 13 mg/dL (ref 8–23)
CO2: 31 mmol/L (ref 22–32)
Calcium: 9.1 mg/dL (ref 8.9–10.3)
Chloride: 99 mmol/L (ref 98–111)
Creatinine, Ser: 0.83 mg/dL (ref 0.44–1.00)
GFR calc Af Amer: >60 mL/min (ref >60)
GFR calc non Af Amer: >60 mL/min (ref >60)
Glucose, Bld: 106 mg/dL — ABNORMAL HIGH (ref 70–99)
Potassium: 4.2 mmol/L (ref 3.5–5.1)
Sodium: 140 mmol/L (ref 135–145)
Total Bilirubin: 0.8 mg/dL (ref 0.3–1.2)
Total Protein: 6.2 g/dL — ABNORMAL LOW (ref 6.5–8.1)

## 2019-07-07 LAB — BODY FLUID CELL COUNT WITH DIFFERENTIAL
Eos, Fluid: 0 %
Lymphs, Fluid: 74 %
Monocyte-Macrophage-Serous Fluid: 19 %
Neutrophil Count, Fluid: 7 %
Total Nucleated Cell Count, Fluid: 195 cu mm

## 2019-07-07 LAB — ALBUMIN, PLEURAL OR PERITONEAL FLUID: Albumin, Fluid: 2.6 g/dL

## 2019-07-07 LAB — GLUCOSE, PLEURAL OR PERITONEAL FLUID: Glucose, Fluid: 111 mg/dL

## 2019-07-07 LAB — PROTEIN, PLEURAL OR PERITONEAL FLUID: Total protein, fluid: 3.6 g/dL

## 2019-07-07 LAB — FOLATE: Folate: 5.9 ng/mL — ABNORMAL LOW (ref >5.9)

## 2019-07-07 LAB — LACTATE DEHYDROGENASE, PLEURAL OR PERITONEAL FLUID: LD, Fluid: 216 U/L — ABNORMAL HIGH (ref 3–23)

## 2019-07-07 LAB — IRON AND TIBC
Iron: 39 ug/dL (ref 28–170)
Saturation Ratios: 13 % (ref 10.4–31.8)
TIBC: 291 ug/dL (ref 250–450)
UIBC: 252 ug/dL

## 2019-07-07 LAB — VITAMIN B12: Vitamin B-12: 3183 pg/mL — ABNORMAL HIGH (ref 180–914)

## 2019-07-07 LAB — PHOSPHORUS: Phosphorus: 3.5 mg/dL (ref 2.5–4.6)

## 2019-07-07 MED ORDER — APIXABAN 5 MG PO TABS: 5.0000 mg | ORAL_TABLET | Freq: Two times a day (BID) | ORAL | Status: DC

## 2019-07-07 MED ORDER — APIXABAN 5 MG PO TABS
10.0000 mg | ORAL_TABLET | Freq: Two times a day (BID) | ORAL | Status: DC
  Administered 2019-07-07 – 2019-07-10 (×7): 10 mg via ORAL
  Filled 2019-07-07 (×7): qty 2

## 2019-07-07 MED ORDER — HEPARIN BOLUS VIA INFUSION
1000.0000 [IU] | Freq: Once | INTRAVENOUS | Status: AC
  Administered 2019-07-07: 1000 [IU] via INTRAVENOUS
  Filled 2019-07-07: qty 1000
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
## 2019-07-07 NOTE — TOC Initial Note (Signed)
Transition of Care [facility]) - Initial/Assessment Note    Patient Details  Name: Gail Nelson MRN: [id number] Date of Birth: January 04, 1924  Transition of Care [facility]) CM/SW Contact:    Shelbie Ammons, RN Phone Number: 07/07/2019, 3:31 PM  Clinical Narrative:  RNCM contacted daughter Mechele Claude, per request of patient. RNCM introduced self and relayed that was calling to discuss PT recommendations that patient go to SNF at discharge. Mechele Claude reports that she had spoken with both Dr. Burlene Arnt today and [facility] and they had already discussed patient going for skilled placement to get stronger prior to coming home. Discussed that this CM would be the one to facilitate this. Mechele Claude reports that her mother has never been to SNF before and they really don't have any preferences as to where she goes except that she would like for her to be closer to home if possible.  RNCM completed PASSR, completed FL-2 and started bed search.     Expected Discharge Plan: Skilled Nursing Facility Barriers to Discharge: Continued Medical Work up   Patient Goals and CMS Choice        Expected Discharge Plan and Services Expected Discharge Plan: [place]   Discharge Planning Services: CM Consult Post Acute Care Choice: [place] Living arrangements for the past 2 months: Single Family Home                                      Prior Living Arrangements/Services Living arrangements for the past 2 months: Single Family Home Lives with:: Adult Children Patient language and need for interpreter reviewed:: Yes Do you feel safe going back to the place where you live?: Yes      Need for Family Participation in Patient Care: Yes (Comment) Care giver support system in place?: Yes (comment)   Criminal Activity/Legal Involvement Pertinent to Current Situation/Hospitalization: No - Comment as needed  Activities of Daily Living Home Assistive Devices/Equipment: Oxygen,  Eyeglasses ADL Screening (condition at time of admission) Patient's cognitive ability adequate to safely complete daily activities?: Yes Is the patient deaf or have difficulty hearing?: Yes Does the patient have difficulty seeing, even when wearing glasses/contacts?: Yes Does the patient have difficulty concentrating, remembering, or making decisions?: No Patient able to express need for assistance with ADLs?: No Does the patient have difficulty dressing or bathing?: No Independently performs ADLs?: Yes (appropriate for developmental age) Does the patient have difficulty walking or climbing stairs?: No Weakness of Legs: Both Weakness of Arms/Hands: None  Permission Sought/Granted Permission sought to share information with : Family Supports    Share Information with NAME: daughter Mechele Claude           Emotional Assessment         Alcohol / Substance Use: Not Applicable Psych Involvement: No (comment)  Admission diagnosis:  Pleural effusion [J90] DVT (deep venous thrombosis) (HCC) [I82.409] Acute deep vein thrombosis (DVT) of femoral vein of left lower extremity ([place]) [I82.412] Malignant neoplasm of right lung, unspecified part of lung ([place]) [C34.91] Patient Active Problem List   Diagnosis Date Noted  . Palliative care encounter   . DVT (deep venous thrombosis) ([place]) 07/04/2019  . Goals of care, counseling/discussion 06/13/2019  . Hematoma 06/10/2019  . Primary malignant neoplasm of right lower lobe of lung ([place]) 04/01/2019  . Neoplasm of uncertain behavior of right lower lobe of lung 03/16/2019  . Acute upper respiratory infection 12/08/2018  .  Vasomotor rhinitis 12/08/2018  . Cough 09/16/2018  . Dependence on nocturnal oxygen therapy 07/23/2018  . SOB (shortness of breath) 07/23/2018  . Lower extremity pain, bilateral 02/24/2018  . Encounter for general adult medical examination with abnormal findings 02/22/2018  . Atherosclerosis of autologous vein bypass graft(s) of the  extremities with intermittent claudication, bilateral legs ([place]) 02/22/2018  . Bilateral lower extremity edema 02/22/2018  . Dysuria 02/22/2018  . AAA (abdominal aortic aneurysm) without rupture ([place]) 08/23/2017  . Lymphedema 08/23/2017  . Chest pain 07/26/2017  . Tinea corporis 07/26/2017  . Obstructive chronic bronchitis without exacerbation ([place]) 02/19/2017  . Emphysema, unspecified ([place]) 02/19/2017  . Chronic obstructive pulmonary disease ([place]) 03/28/2016  . Benign hypertension 03/28/2016   PCP:  Lavera Guise, MD Pharmacy:   [facility] [phone] [address], [address] [place] [address] Phone: [phone] Fax: [phone]     Social Determinants of Health (SDOH) Interventions    Readmission Risk Interventions No flowsheet data found.

## 2019-07-07 NOTE — Evaluation (Signed)
Occupational Therapy Evaluation Patient Details Name: Gail Nelson MRN: [id number] DOB: 12-24-1923 Today's Date: 07/07/2019    History of Present Illness 86 year old female who presented with an extensive left lower extremity DVT status post thrombectomy on 07/06/19. PMHx includes COPD, HTN, RLL malignancy.   Clinical Impression   Pt was seen for OT evaluation this date. Prior to hospital admission, pt was independent with ADL, driving, med mgt, and denies any falls in >12 mo. Pt lives with her daughter who works full time. Pt enjoys being "on the go" and socializing with others. Pt reports she was volunteering but hasn't been in the past couple years due to her age ("They see my age and don't want to risk it"). Pt very motivated to do for herself during session. Mod I for bed mobility, SBA for STS and SPT from EOB <> BSC. Set up and pt able to perform hygiene with lateral lean. Pt very receptive to education. Currently pt demonstrates impairments as described below (See OT problem list) which functionally limit her ability to perform ADL/self-care tasks. Pt currently requires close SBA to HHA for ADL mobility and Min A for LB ADL.  Pt would benefit from skilled OT to address noted impairments and functional limitations (see below for any additional details) in order to maximize safety and independence while minimizing falls risk and caregiver burden. Upon hospital discharge, recommend HHOT to maximize pt safety and return to functional independence during meaningful occupations of daily life in addition to initial 24/7 supervision/safety.      Follow Up Recommendations  Home health OT;Supervision/Assistance - 24 hour(initial 24/7 supervision for safety)    Equipment Recommendations  3 in 1 bedside commode;Other (comment)(reacher)    Recommendations for Other Services       Precautions / Restrictions Precautions Precautions: Fall Restrictions Weight Bearing Restrictions: No       Mobility Bed Mobility Overal bed mobility: Needs Assistance Bed Mobility: Supine to Sit     Supine to sit: Modified independent (Device/Increase time)        Transfers Overall transfer level: Needs assistance Equipment used: None Transfers: Sit to/from [facility] Sit to Stand: Min guard Stand pivot transfers: Min guard       General transfer comment: Pt very determined to attempt without therapist assist, close SBA provided for safety, no LOB noted, HHA from BSC back to EOB    Balance Overall balance assessment: Mild deficits observed, not formally tested                                         ADL either performed or assessed with clinical judgement   ADL Overall ADL's : Needs assistance/impaired                                       General ADL Comments: Min A for LB ADL 2/2 pain, SBA- CGA for ADL transfers, set up assist for toileting hygiene     Vision Baseline Vision/History: Wears glasses Wears Glasses: At all times Patient Visual Report: No change from baseline       Perception     Praxis      Pertinent Vitals/Pain Pain Assessment: 0-10 Pain Score: 5  Pain Location: LLE, R hip, and R lung Pain Descriptors / Indicators: Aching Pain Intervention(s): Limited activity within  patient's tolerance;Monitored during session;Premedicated before session;Repositioned     Hand Dominance     Extremity/Trunk Assessment Upper Extremity Assessment Upper Extremity Assessment: Overall WFL for tasks assessed(age appropriate, grossly 4/5)   Lower Extremity Assessment Lower Extremity Assessment: Defer to PT evaluation(RLE WFL age appropriate, LLE not tested)       Communication Communication Communication: HOH   Cognition Arousal/Alertness: Awake/alert Behavior During Therapy: WFL for tasks assessed/performed Overall Cognitive Status: Within Functional Limits for tasks assessed                                  General Comments: A&Ox4, follows all commands   General Comments       Exercises Other Exercises Other Exercises: Pt instructed in falls prevention, introduced AE to assist in ADL tasks while pain limited with LB ADL. Pt will benefit from additional instruction   Shoulder Instructions      Home Living Family/patient expects to be discharged to:: Private residence Living Arrangements: Children(daughter) Available Help at Discharge: Family;Available PRN/intermittently(pt reports dtr works full time) Type of Home: House Home Access: Stairs to enter Technical brewer of Steps: 3 Entrance Stairs-Rails: Can reach both;Right;Left Home Layout: One level     Bathroom Shower/Tub: Occupational psychologist: Standard     Home Equipment: Civil engineer, contracting - built in;Hand held shower head;Grab bars - tub/shower;Walker - 4 wheels   Additional Comments: pt endorses fear with showering and falling, states built in bench is too tall and she is afraid she will slide off      Prior Functioning/Environment Level of Independence: Needs assistance  Gait / Transfers Assistance Needed: pt reports getting rollator last week and starting to use in the house, states "I didn't realize how much I needed it before that." ADL's / Homemaking Assistance Needed: Pt reports indep with ADL until she got fluid in her R lung and has difficulty with LB ADL now, manages her own medication and still drives, sociable person, was volunteering until recently when she states "people saw my age and didn't want to take the risk"   Comments: reports only 1 fall in her life, > 12 mo ago        OT Problem List: Pain;Decreased activity tolerance;Decreased knowledge of use of DME or AE;Decreased safety awareness      OT Treatment/Interventions: Self-care/ADL training;Therapeutic exercise;Therapeutic activities;DME and/or AE instruction;Patient/family education;Balance training    OT Goals(Current  goals can be found in the care plan section) Acute Rehab OT Goals Patient Stated Goal: get my leg feeling better so I can go home and stay independent OT Goal Formulation: With patient Time For Goal Achievement: 07/21/19 Potential to Achieve Goals: Good ADL Goals Pt Will Perform Lower Body Dressing: sit to/from stand;with modified independence;with adaptive equipment Pt Will Transfer to Toilet: with modified independence;ambulating(LRAD for amb) Additional ADL Goal #1: Pt will verbalize plan to implement at least 1 learned falls prevention/ADL modification strategy to improve pt's self reported safety with showering.  OT Frequency: Min 1X/week   Barriers to D/C:            Co-evaluation              AM-PAC OT "6 Clicks" Daily Activity     Outcome Measure Help from another person eating meals?: None Help from another person taking care of personal grooming?: None Help from another person toileting, which includes using toliet, bedpan, or urinal?: A Little Help  from another person bathing (including washing, rinsing, drying)?: A Little Help from another person to put on and taking off regular upper body clothing?: None Help from another person to put on and taking off regular lower body clothing?: A Little 6 Click Score: 21   End of Session Equipment Utilized During Treatment: Gait belt  Activity Tolerance: Patient tolerated treatment well Patient left: in bed;with call bell/phone within reach;with nursing/sitter in room(seated EOB with RN and NT for seated sponge bath)  OT Visit Diagnosis: Other abnormalities of gait and mobility (R26.89);Pain Pain - Right/Left: Left Pain - part of body: Leg                Time: 6725-5001 OT Time Calculation (min): 38 min Charges:  OT General Charges $OT Visit: 1 Visit OT Evaluation $OT Eval Moderate Complexity: 1 Mod OT Treatments $Self Care/Home Management : 23-37 mins  Jeni Salles, MPH, MS, OTR/L ascom [phone] 07/07/19,  12:19 PM

## 2019-07-07 NOTE — Care Management Important Message (Deleted)
Important Message  Patient Details  Name: Gail Nelson MRN: [id number] Date of Birth: [birth year]   Medicare Important Message Given:  Yes     Dannette Barbara 07/07/2019, 1:59 PM
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
## 2019-07-07 NOTE — Consult Note (Signed)
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 EXAM: ULTRASOUND GUIDED RIGHT THORACENTESIS COMPARISON:  CTA of the chest on 07/04/2019 PROCEDURE: An ultrasound guided thoracentesis was thoroughly discussed with the patient and questions answered. The benefits, risks, alternatives and complications were also discussed. The patient understands and wishes to proceed with the procedure. Written consent was obtained. Ultrasound was performed to localize and mark an adequate pocket of fluid in the right chest. The area was then prepped and draped in the normal sterile fashion. 1% Lidocaine was used for local anesthesia. Under ultrasound guidance a 6 French Safe-T-Centesis catheter was introduced. Thoracentesis was performed. The catheter was removed and a dressing applied. COMPLICATIONS: None FINDINGS: A total of approximately 1.2 L of bloody fluid was removed. IMPRESSION: Successful ultrasound guided right thoracentesis yielding 1.2 L of pleural fluid. Electronically Signed   By: Aletta Edouard M.D.   On: 07/07/2019 16:16    Primary malignant neoplasm of right lower lobe of lung ([place]) 86 year old female patient with a history of stage IV lung  cancer-currently admitted to hospital for left lower extremity acute DVT  #Left lower extremity acute DVT-s/p thrombectomy; currently on IV heparin  # Metastatic/stage IV lung cancer [cytology pleural effusion; favor adeno]/CT scan showing large pleural effusion.   # Right lower extremity bruising/ecchymosis acute pain-post aneurysm repair-improved  Recommendation:   #With regards to pleural effusion-recommend thoracentesis for immediate improvement of symptoms.  However patient will need Pleurx catheter on outpatient basis.   #Left lower extremity DVT-currently on IV heparin; status post thoracentesis patient can be switched over to Eliquis.  Patient had been on Eliquis only for  a day or so prior to admission to hospital; hence technically should not be considered as failure of Eliquis.   #With regards to stage IV malignancy-unfortunately patient is a poor candidate for palliative systemic therapy-given her age/comorbidities/poor performance status.  Patient will be at high risk for complications from therapy.  After treatment of acute issues-patient would benefit from rehab placement.  And at discharge from rehab patient could potentially be enrolled in the home hospice.  I discussed with the patient's daughter joined at length the above plan of care.  She verbalizes that she and her mother agree with the above plan; and she also feels patient high risk of complications from further therapy.  Discussed with Dr. Lucky Cowboy; and also discussed with Dr. Shelton Silvas; and [facility].   Thank you Dr.Sheikh for allowing me to participate in the care of your pleasant patient. Please do not hesitate to contact me with questions or concerns in the interim.   All questions were answered. The patient knows to call the clinic with any problems, questions or concerns.   Cammie Sickle, MD 07/07/2019 9:17 PM

## 2019-07-07 NOTE — Evaluation (Signed)
Physical Therapy Evaluation Patient Details Name: Gail Nelson MRN: [id number] DOB: 05-Mar-1923 Today's Date: 07/07/2019   History of Present Illness  86 year old female who presented with an extensive left lower extremity DVT status post thrombectomy on 07/06/19. PMHx includes COPD, HTN, RLL malignancy.  Clinical Impression  Patient received sitting edge of bed with daughter present in room. Patient agreeable to PT assessment. She is hesitant to ambulate with walker and wants to see how she does. Patient requires min guard for sit to stand from bed. Min assist for ambulation of 75 feet. Slow pace with need for at least single UE support with ambulation. She required one standing rest break due to fatigue. Patient will continue to benefit from skilled PT while here to improve balance, strength and functional independence to return home when able.      Follow Up Recommendations SNF;Supervision for mobility/OOB    Equipment Recommendations  None recommended by PT    Recommendations for Other Services       Precautions / Restrictions Precautions Precautions: Fall Restrictions Weight Bearing Restrictions: No      Mobility  Bed Mobility Overal bed mobility: Needs Assistance Bed Mobility: Supine to Sit     Supine to sit: Modified independent (Device/Increase time)     General bed mobility comments: patient received sitting up on the side of bed. Returned to the side of bed  Transfers Overall transfer level: Needs assistance Equipment used: None Transfers: Sit to/from [facility] Sit to Stand: Min guard Stand pivot transfers: Min guard       General transfer comment: Pt very determined to attempt without therapist assist, close SBA provided for safety, no LOB noted, [facility] from [facility] back to EOB  Ambulation/Gait Ambulation/Gait assistance: Min guard;Min assist Gait Distance (Feet): 75 Feet Assistive device: None;1 person hand held assist   Gait velocity:  decr   General Gait Details: patient reliant on 1 UE support with ambulation. Reaching for door, counter, bed etc. using rail along hallway for steadying. HHA for gaps without rail assist. One standing rest break.  Stairs            Wheelchair Mobility    Modified Rankin (Stroke Patients Only)       Balance Overall balance assessment: Needs assistance Sitting-balance support: Feet supported Sitting balance-Leahy Scale: Good     Standing balance support: Single extremity supported;During functional activity Standing balance-Leahy Scale: Fair Standing balance comment: Reliant on at least single UE support for balance and safety                             Pertinent Vitals/Pain Pain Assessment: No/denies pain Pain Score: 5  Pain Location: LLE, R hip, and R lung Pain Descriptors / Indicators: Aching Pain Intervention(s): Limited activity within patient's tolerance;Monitored during session;Premedicated before session;Repositioned    Home Living Family/patient expects to be discharged to:: Private residence Living Arrangements: Children Available Help at Discharge: Family;Available PRN/intermittently Type of Home: House Home Access: Stairs to enter Entrance Stairs-Rails: Can reach both;Right;Left Entrance Stairs-Number of Steps: 3 Home Layout: One level Home Equipment: Shower seat - built in;Hand held shower head;Grab bars - tub/shower;Walker - 4 wheels Additional Comments: Daughter works during the day so patient would be alone all day    Prior Function Level of Independence: Needs assistance   Gait / Transfers Assistance Needed: pt reports getting rollator last week and starting to use in the house, states "I didn't realize how much  I needed it before that."  ADL's / Homemaking Assistance Needed: Pt reports indep with ADL until she got fluid in her R lung and has difficulty with LB ADL now, manages her own medication and still drives, sociable person, was  volunteering until recently when she states "people saw my age and didn't want to take the risk"  Comments: reports only 1 fall in her life, > 12 mo ago     Hand Dominance        Extremity/Trunk Assessment   Upper Extremity Assessment Upper Extremity Assessment: Generalized weakness;Defer to OT evaluation    Lower Extremity Assessment Lower Extremity Assessment: Generalized weakness    Cervical / Trunk Assessment Cervical / Trunk Assessment: Normal  Communication   Communication: HOH  Cognition Arousal/Alertness: Awake/alert Behavior During Therapy: WFL for tasks assessed/performed Overall Cognitive Status: Within Functional Limits for tasks assessed                                 General Comments: A&Ox4, follows all commands      General Comments      Exercises Other Exercises Other Exercises: Pt instructed in falls prevention, introduced AE to assist in ADL tasks while pain limited with LB ADL. Pt will benefit from additional instruction   Assessment/Plan    PT Assessment Patient needs continued PT services  PT Problem List Decreased strength;Decreased mobility;Decreased activity tolerance;Decreased balance;Decreased safety awareness;Cardiopulmonary status limiting activity;Decreased knowledge of use of DME       PT Treatment Interventions DME instruction;Therapeutic exercise;Gait training;Balance training;Stair training;Neuromuscular re-education;Functional mobility training;Therapeutic activities;Patient/family education    PT Goals (Current goals can be found in the Care Plan section)  Acute Rehab PT Goals Patient Stated Goal: patient open to SNF for short term rehab to get stronger to then go home PT Goal Formulation: With patient/family Time For Goal Achievement: 07/21/19 Potential to Achieve Goals: Good    Frequency Min 2X/week   Barriers to discharge Decreased caregiver support      Co-evaluation               AM-PAC PT "6  Clicks" Mobility  Outcome Measure Help needed turning from your back to your side while in a flat bed without using bedrails?: A Little Help needed moving from lying on your back to sitting on the side of a flat bed without using bedrails?: A Little Help needed moving to and from a bed to a chair (including a wheelchair)?: A Little Help needed standing up from a chair using your arms (e.g., wheelchair or bedside chair)?: A Little Help needed to walk in hospital room?: A Little Help needed climbing 3-5 steps with a railing? : A Lot 6 Click Score: 17    End of Session Equipment Utilized During Treatment: Gait belt Activity Tolerance: Patient limited by fatigue Patient left: in bed;with call bell/phone within reach Nurse Communication: Mobility status PT Visit Diagnosis: Unsteadiness on feet (R26.81);Difficulty in walking, not elsewhere classified (R26.2);Muscle weakness (generalized) (M62.81)    Time: 1255-1315 PT Time Calculation (min) (ACUTE ONLY): 20 min   Charges:   PT Evaluation $PT Eval Moderate Complexity: 1 Mod PT Treatments $Gait Training: 8-22 mins        Sravya Grissom, PT, GCS 07/07/19,1:20 PM

## 2019-07-07 NOTE — Assessment & Plan Note (Addendum)
86 year old female patient with a history of stage IV lung cancer-currently admitted to hospital for left lower extremity acute DVT  #Left lower extremity acute DVT-secondary to underlying malignancy-s/p thrombectomy; currently on Eliquis.  Stable.  #Right large malignant pleural effusion- s/p thoracentesis drained 1.2 L of bloody fluid.  Symptomatically improved.  Recommend outpatient Pleurx catheter placement.   # Metastatic/stage IV lung cancer-patient is a poor candidate for palliative systemic therapy.  Patient is high risk of complications from systemic therapy.  I would recommend best supportive care moving forward.  Discussed with the patient and daughter-they are also in agreement that the risk of treatment outweigh the benefits.  Interested in maintaining/quality of life.  Discussed with [employer].  Recommend discharge to nursing home when bed available.

## 2019-07-07 NOTE — NC FL2 (Signed)
[place] LEVEL OF CARE SCREENING TOOL     IDENTIFICATION  Patient Name: Gail Nelson Birthdate: Jun 17, 1923 Sex: female Admission Date (Current Location): 07/04/2019  [place] and [place] Number:  Engineering geologist and Address:  [facility] [place], [address], [place], [place] [address]      Provider Number: [id number]  Attending Physician Name and Address:  Kerney Elbe, DO  Relative Name and Phone Number:  Melton Alar [phone]    Current Level of Care: Hospital Recommended Level of Care: [place] Prior Approval Number:    Date Approved/Denied:   PASRR Number: [id number]  Discharge Plan: SNF    Current Diagnoses: Patient Active Problem List   Diagnosis Date Noted  . Palliative care encounter   . DVT (deep venous thrombosis) ([place]) 07/04/2019  . Goals of care, counseling/discussion 06/13/2019  . Hematoma 06/10/2019  . Primary malignant neoplasm of right lower lobe of lung ([place]) 04/01/2019  . Neoplasm of uncertain behavior of right lower lobe of lung 03/16/2019  . Acute upper respiratory infection 12/08/2018  . Vasomotor rhinitis 12/08/2018  . Cough 09/16/2018  . Dependence on nocturnal oxygen therapy 07/23/2018  . SOB (shortness of breath) 07/23/2018  . Lower extremity pain, bilateral 02/24/2018  . Encounter for general adult medical examination with abnormal findings 02/22/2018  . 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 Bilateral lower extremity edema 02/22/2018  . Dysuria 02/22/2018  . AAA (abdominal aortic aneurysm) without rupture ([place]) 08/23/2017  . Lymphedema 08/23/2017  . Chest pain 07/26/2017  . Tinea corporis 07/26/2017  . Obstructive chronic bronchitis without exacerbation ([place]) 02/19/2017  . Emphysema, unspecified ([place]) 02/19/2017  . Chronic obstructive pulmonary disease ([place])  03/28/2016  . Benign hypertension 03/28/2016    Orientation RESPIRATION BLADDER Height & Weight     Self, Time, Situation, Place  Normal, O2 Continent Weight: 89.9 kg Height:  5\' 2"  (157.5 cm)  BEHAVIORAL SYMPTOMS/MOOD NEUROLOGICAL BOWEL NUTRITION STATUS      Continent Diet(Regular)  AMBULATORY STATUS COMMUNICATION OF NEEDS Skin   Extensive Assist Verbally Normal                       Personal Care Assistance Level of Assistance  Bathing, Feeding, Dressing Bathing Assistance: Maximum assistance Feeding assistance: Limited assistance Dressing Assistance: Maximum assistance     Functional Limitations Info  Sight, Speech, Hearing Sight Info: Adequate Hearing Info: Adequate Speech Info: Adequate    SPECIAL CARE FACTORS FREQUENCY  PT (By licensed PT), OT (By licensed OT)                    Contractures Contractures Info: Not present    Additional Factors Info  Code Status, Allergies Code Status Info: DNR Allergies Info: Augmentin, Clarithromycin, Biaxin, Oxycodone, PNC, Tramadol           Current Medications (07/07/2019):  This is the current hospital active medication list Current Facility-Administered Medications  Medication Dose Route Frequency Provider Last Rate Last Admin  . 0.9 %  sodium chloride infusion  250 mL Intravenous PRN Algernon Huxley, MD      . acetaminophen (TYLENOL) tablet 650 mg  650 mg Oral Q6H PRN Algernon Huxley, MD       Or  . acetaminophen (TYLENOL) suppository 650 mg  650 mg Rectal Q6H PRN Algernon Huxley, MD      . albuterol (PROVENTIL) (2.5  MG/3ML) 0.083% nebulizer solution 2.5 mg  2.5 mg Nebulization Q6H PRN Algernon Huxley, MD      . apixaban Arne Cleveland) tablet 10 mg  10 mg Oral BID Lu Duffel, RPH   10 mg at 07/07/19 1136   Followed by  . [START ON 07/14/2019] apixaban (ELIQUIS) tablet 5 mg  5 mg Oral BID Shanlever, Pierce Crane, RPH      . carvedilol (COREG) tablet 3.125 mg  3.125 mg Oral BID Algernon Huxley, MD   3.125 mg at 07/07/19  0930  . cholecalciferol (VITAMIN D3) tablet 1,000 Units  1,000 Units Oral Daily Algernon Huxley, MD   1,000 Units at 07/07/19 0930  . feeding supplement (ENSURE ENLIVE) (ENSURE ENLIVE) liquid 237 mL  237 mL Oral BID BM Algernon Huxley, MD   237 mL at 07/06/19 1706  . HYDROcodone-acetaminophen (NORCO/VICODIN) 5-325 MG per tablet 1 tablet  1 tablet Oral Q6H PRN Algernon Huxley, MD   1 tablet at 07/07/19 0756  . HYDROmorphone (DILAUDID) injection 1 mg  1 mg Intravenous Once PRN Algernon Huxley, MD      . losartan (COZAAR) tablet 50 mg  50 mg Oral Daily Algernon Huxley, MD   50 mg at 07/07/19 0930  . mometasone-formoterol (DULERA) 200-5 MCG/ACT inhaler 2 puff  2 puff Inhalation BID Algernon Huxley, MD   2 puff at 07/07/19 0930  . ondansetron (ZOFRAN) tablet 4 mg  4 mg Oral Q6H PRN Algernon Huxley, MD       Or  . ondansetron (ZOFRAN) injection 4 mg  4 mg Intravenous Q6H PRN Algernon Huxley, MD      . sodium chloride flush (NS) 0.9 % injection 3 mL  3 mL Intravenous Q12H Algernon Huxley, MD   3 mL at 07/07/19 0930  . sodium chloride flush (NS) 0.9 % injection 3 mL  3 mL Intravenous PRN Algernon Huxley, MD      . vitamin B-12 (CYANOCOBALAMIN) tablet 1,000 mcg  1,000 mcg Oral Daily Algernon Huxley, MD   1,000 mcg at 07/07/19 0930     Discharge Medications: Please see discharge summary for a list of discharge medications.  Relevant Imaging Results:  Relevant Lab Results:   Additional Information SS# [id number]  Shelbie Ammons, RN

## 2019-07-07 NOTE — Procedures (Signed)
Interventional Radiology Procedure Note  Procedure: US guided right thoracentesis  Complications: None  Estimated Blood Loss: None  Findings: 1.2 L of bloody fluid removed from right pleural space. Post CXR pending.  Venetia Night. Kathlene Cote, M.D Pager:  [phone]

## 2019-07-07 NOTE — Progress Notes (Signed)
   07/07/19 1000  Clinical Encounter Type  Visited With Patient  Visit Type Follow-up  Referral From Chaplain  Consult/Referral To Chaplain  Chaplain walked in the room to find patient sitting in the bed with a big smile on her face. Patient asked chaplain to pray a prayer of thanksgiving. Chaplain prayed with patient. When asked how she was feeling patient said she is tired but know God has something for her to do. It was a pleasant visit. Chaplain will follow up tomorrow and spend a little more time with patient.

## 2019-07-07 NOTE — Progress Notes (Signed)
PT Cancellation Note  Patient Details Name: GEOVANA GEBEL MRN: [id number] DOB: 1923/04/03   Cancelled Treatment:    Reason Eval/Treat Not Completed: Patient declined, no reason specified. Patient reports she just received pain pill and feeling a bit off, she would like to wait until later. Will return this pm.    Knut Rondinelli 07/07/2019, 10:42 AM

## 2019-07-07 NOTE — Care Management Important Message (Signed)
Important Message  Patient Details  Name: Gail Nelson MRN: [id number] Date of Birth: July 11, 1923   Medicare Important Message Given:  Yes  Reviewed with patient's daughter upon patient's request.  Both patient and daughter aware of right.  Copy of Medicare IM left in patient's room for reference.     Dannette Barbara 07/07/2019, 1:44 PM

## 2019-07-07 NOTE — Progress Notes (Signed)
[facility] Daily Progress Note   Subjective: Patient receiving a bath this a.m. during visit.  Notes her left lower extremity feels better.  No issues overnight.  Objective: Vitals:   07/06/19 2302 07/07/19 0506 07/07/19 0510 07/07/19 0743  BP: 140/67 (!) 141/66 (!) 144/63 135/90  Pulse: 97 (!) 102 97 89  Resp: 20 19 19 20   Temp: 98.2 F (36.8 C) (!) 97.5 F (36.4 C) (!) 97.5 F (36.4 C) 97.9 F (36.6 C)  TempSrc: Oral Oral  Oral  SpO2: 97% (!) 76% 91% 96%  Weight:      Height:        Intake/Output Summary (Last 24 hours) at 07/07/2019 1036 Last data filed at 07/06/2019 2200 Gross per 24 hour  Intake 704.33 ml  Output --  Net 704.33 ml   Physical Exam: A&Ox3, NAD CV: RRR Pulmonary: CTA Bilaterally Abdomen: Soft, Nontender, Nondistended Vascular:  Left lower extremity.  Compression dressing intact clean and dry.  Toes are warm with good capillary refill.  Motor/sensory is intact.   Laboratory: CBC    Component Value Date/Time   WBC 5.2 07/07/2019 0800   HGB 10.6 (L) 07/07/2019 0800   HGB 14.0 08/12/2017 1332   HCT 33.9 (L) 07/07/2019 0800   HCT 43.1 08/12/2017 1332   PLT 179 07/07/2019 0800   PLT 173 12/23/2013 0513   BMET    Component Value Date/Time   NA 140 07/07/2019 0800   NA 140 12/23/2013 0513   K 4.2 07/07/2019 0800   K 4.1 12/23/2013 0513   CL 99 07/07/2019 0800   CL 104 12/23/2013 0513   CO2 31 07/07/2019 0800   CO2 29 12/23/2013 0513   GLUCOSE 106 (H) 07/07/2019 0800   GLUCOSE 102 (H) 12/23/2013 0513   BUN 13 07/07/2019 0800   BUN 8 12/23/2013 0513   CREATININE 0.83 07/07/2019 0800   CREATININE 0.71 12/23/2013 0513   CALCIUM 9.1 07/07/2019 0800   CALCIUM 7.6 (L) 12/23/2013 0513   GFRNONAA >60 07/07/2019 0800   GFRNONAA >60 12/23/2013 0513   GFRAA >60 07/07/2019 0800   GFRAA >60 12/23/2013 0513   Assessment/Planning: The patient is a 86 year old female who presented with an extensive left lower extremity DVT status  post thrombectomy - POD#1  1) transition from heparin to Eliquis 2) we will plan to remove the compression dressing pending the patient discharges 3) elevation of the left lower extremity 4) PT/OT/ambulation  Discussed with Dr. Ellis Parents Shemiah Rosch PA-C 07/07/2019 10:36 AM

## 2019-07-07 NOTE — Progress Notes (Signed)
PROGRESS NOTE    Kameelah Minish Deridder  NAT:[id number] DOB: 1924/01/03 DOA: 07/04/2019 PCP: Lavera Guise, MD  Brief Narrative:  HPI per Dr. Collier Bullock on 07/04/19 Monroe Toure Miltner is a 86 y.o. female with medical of lung cancer significant for lung cancer (recently diagnosed during chemotherapy), hypertension, COPD, abdominal aortic aneurysm status post repair and DVT on Eliquis who presents to the ED with complaints of left leg swelling.  Patient had an endoleak following AAA repair and had a hematoma involving her right lower extremity but that seems to have improved.  She was started on Eliquis for her DVT and follow-up ultrasound shows progression of DVT now showing complete occlusion of the common femoral vein.  Patient complains of chest discomfort which she describes as a pleuritic pain that occurs when she coughs.   Patient had a CT angiogram which shows no evidence acute pulmonary embolism. Chronic large RIGHT pleural effusion with passive atelectasis of the RIGHT lung. Left leg venous Doppler shows in comparison with recent study, there is now essentially complete occlusion of the left common femoral vein and left saphenofemoral junction regions. Thrombus is no longer appreciable in the left profunda femoral vein. There has been likely propagation of acute deep venous thrombosis more proximally into the left common femoral junction region. Note that this circumstance places patient at significant increased risk for pulmonary embolus development. She denies having any fever or chills, no cough, no dizziness or lightheadedness, no urinary symptoms or changes in her bowel habits.  ED Course: 86 year old female with a history of lung cancer and recently diagnosed DVT who presents to the emergency room for worsening lower extremity swelling.  Patient had a left leg venous Doppler which showed In comparison with recent study, there is now essentially complete occlusion of the left common  femoral vein and left saphenofemoral junction regions. Thrombus is no longer appreciable in the left profunda femoral vein. There has been likely propagation of acute deep venous thrombosis more proximally into the left common femoral junction region. Note that this circumstance places patient at significant increased risk for pulmonary embolus development. Visualized proximal inferior vena cava appears patent without evident thrombus.  **Interim History Because of her persistent symptoms she underwent a mechanical thrombectomy with placement of an IVC filter.  Medical oncology was consulted for further anticoagulation recommendations and at their recommendation palliative care was also consulted for goals of care discussion and they recommend probable eventual hospice involvement at home and currently want to see if the patient can improve her functional status and PT OT now recommending SNF.  Medical oncology consulted and they recommending Pleurx catheter placement however this cannot be done so and instead will obtain a thoracentesis today and arrange for the proximal catheter to be done in outpatient setting.  I spoke with Dr. Rogue Bussing and he does not feel that this is a true failure of Eliquis and recommends changing to Eliquis after thoracentesis.  She is transition from heparin to Eliquis by vascular team and regular remove her compression dressing pending the patient discharges and they are recommending elevation of the left lower extremity.  Assessment & Plan:   Principal Problem:   DVT (deep venous thrombosis) (HCC) Active Problems:   Chronic obstructive pulmonary disease (HCC)   Benign hypertension   Primary malignant neoplasm of right lower lobe of lung (HCC)  LEFT Leg DVT -Initially thought to have Failed anticoagulant therapy (Eliquis) however after extensive discussion with her primary hematologist and oncologist he does not feel  that she truly feels Eliquis and recommends  discharging her back on Eliquis; DVT has extended and fully occluded left lower extremity common femoral vein and left saphenofemoral -Evaluated by Dr. Harold Barban vascular surgery on 5/31.  Agrees with starting patient on heparin see note  -Vascular Surgery took the patient for undergoing mechanical thrombectomy with placement of an IVC filter on 6/2 due to continued LLE Pain and swelling; See below. Dr Lucky Cowboy preformed the procedure  -Per Vascular unable to use TPA due to the patient having cancer given increased risk of bleeding -Vascular Surgery recommending continuing Heparin and transitioning back to Eliquis post procedure but will discuss with Oncology about Anticoagulation recc's -C/w Heparin gtt for now and Dr. Rogue Bussing he personally feels that she will need Lovenox Injections but feels that family and patient may not be amenable. He feels that a GOC discussion should be had including hospice discussion and likely she will need hospice in outpatient setting so palliative care was consulted. -Dr. Rogue Bussing to see the patient in the a.m. and he is also recommended palliative care consultation which is also been placed -Dr. Rogue Bussing saw the patient and feels that that she did not really feel Eliquis and recommends Eliquis at discharge and she is transition from heparin to Eliquis today by vascular but before she was transitioned she had a thoracentesis done for her large malignant likely pleural effusion.  RIGHT Lower Lobe stage IV Adenocarcinoma lung cancer with pleural effusion -Per Dr.Govinda Brahmanday Oncology note from 06/20/2019 patient s/p radiation treatment 06/09/2019.  Also received Keytruda but was placed on hold secondary to side effects. -Has Chronic Large Right Pleural Effusion with Passive Atelectasis of the Right Lung -Patient was to follow-up in 3 weeks and will discuss with Dr. Rogue Bussing and he will see the patient in the a.m. -Medical oncology recommending Pleurx catheter to  be placed however this cannot be done in the inpatient setting so they will be arranging to be done outpatient setting but instead she underwent thoracentesis today and had 1.2 L of fluid removed -Continue with supportive care  Hypertension -C/w Coreg 3.125 mg BID -C/w Losartan 50 mg daily  CKD Stage 3a -Patient's BUN/Cr has remained relatively stable and yesterday was 21/1.04 with repeat this AM was 13/2.3 -Continue to monitor and trend -Stopped IV fluid hydration -Avoid Nephrotoxic Medications, Contrast Dyes, Hypotension and Renally adjust Medications -Repeat CMP in the AM   AAA s/p Repair -Follow-up in the outpatient setting  COPD -Continue as needed bronchodilator therapy with albuterol 2.5 mg nebs every 6 hours as needed for wheezing as well as mometasone-formoterol 2 puffs elation twice daily  Normocytic Anemia -Patient's Hgb/Hct went from 11.9/36.9 -> 11.3/36.0 and today it is 10.6/33.9 -Check Anemia Panel and showed an iron level of 39, U IBC of 252, TIBC of 291, saturation ratios of 13%, ferritin level of 110, folate level 5.9, vitamin B12 of 3183 -Continue to Monitor for S/Sx of Bleeding as she is Anticoagulated with Heparin gtt; Currently no overt bleeding noted -Repeat CBC in AM  Obesity -Estimated body mass index is 36.25 kg/m as calculated from the following:   Height as of this encounter: 5\' 2"  (1.575 m).   Weight as of this encounter: 89.9 kg. -Weight Loss and Dietary Counseling given  -Nutritionist consulted and recommending Ensure Enlive po BID  GOC: DNR, poA -Palliative care consulted for further evaluation recommendations given medical oncology recommendations -Likely will need hospice discussion at some point given Oncology's concerned that she is not going  to do well long-term -Palliative care consulted and they spoke with the patient and the patient's daughter and the patient daughter does not feel the patient can return home at the time of discharge and  recommends going to rehab for PT OT but ultimately they would be in agreement with the patient going home with hospice supportive care eventually; per palliative patient's daughter is frustrated by patient's recent medical care and patient's daughter only wants to primarily focus on improving the patient's performance status to ensure that she has the highest quality of life going forward  DVT prophylaxis: Anticoagulated with Heparin gtt and now being transitioned back to Eliquis Code Status: DO NOT RESUSCITATE Family Communication: No family present at bedside  Disposition Plan: Remain inpatient for further evaluation recommendations vascular surgery as well as medical oncology  Status is: Inpatient  Remains inpatient appropriate because:Ongoing diagnostic testing needed not appropriate for outpatient work up, Unsafe d/c plan, IV treatments appropriate due to intensity of illness or inability to take PO and Inpatient level of care appropriate due to severity of illness   Dispo: The patient is from: Home              Anticipated d/c is to: SNF              Anticipated d/c date is: 2 days              Patient currently is not medically stable to d/c.  Consultants:   Vascular Surgery Dr. Harold Barban and Dr. Lucky Cowboy  Medical Oncology Dr. Rogue Bussing  Palliative Care Medicine    Procedures:  CTA   LLE VENOUS DUPLEX 07/04/19 IMPRESSION: In comparison with recent study, there is now essentially complete occlusion of the left common femoral vein and left saphenofemoral junction regions. Thrombus is no longer appreciable in the left profunda femoral vein. There has been likely propagation of acute deep venous thrombosis more proximally into the left common femoral junction region. Note that this circumstance places patient at significant increased risk for pulmonary embolus development.  Visualized proximal inferior vena cava appears patent without evident thrombus.  PROCEDURE 07/06/19  by Dr. Lucky Cowboy 1. US guidance for vascular access to left popliteal vein 2. Catheter placement into left common iliac vein from left popliteal approach 3. IVC gram and left lower extremity venogram 4. Mechanical thrombectomy to proximal left superficial femoral vein, common femoral vein, external and common iliac veins with the penumbra CAT 12 device 5. PTA of left external and common iliac veins with 10 mm diameter angioplasty balloon  Thoracentesis was done 07/07/2019 and drained 1.2 L of bloody fluid from the right pleural space   Antimicrobials: Anti-infectives (From admission, onward)   Start     Dose/Rate Route Frequency Ordered Stop   07/06/19 0800  clindamycin (CLEOCIN) IVPB 300 mg  Status:  Discontinued    Note to Pharmacy: To be given in specials   300 mg 100 mL/hr over 30 Minutes Intravenous  Once 07/06/19 0748 07/06/19 1313   07/06/19 0748  clindamycin (CLEOCIN) 300 MG/50ML IVPB    Note to Pharmacy: Maynor, Erin   : cabinet override      07/06/19 0748 07/06/19 1959     Subjective: Patient seen and examined at bedside this AM and the palliative team was in there.  I spoke with her at length about getting Pleurx catheter and she is hesitant but after palliative care discussion feels that it may be beneficial.  This was to be done however because of  scheduling this cannot be done in the inpatient setting so a thoracentesis will be done instead.  She denies any nausea or vomiting but complains of some leg pain.  Still has some shortness of breath.  No other concerns or plans at this time.  Objective: Vitals:   07/06/19 1755 07/06/19 2302 07/07/19 0506 07/07/19 0510  BP: 134/71 140/67 (!) 141/66 (!) 144/63  Pulse: (!) 106 97 (!) 102 97  Resp: 18 20 19 19   Temp: 98.4 F (36.9 C) 98.2 F (36.8 C) (!) 97.5 F (36.4 C) (!) 97.5 F (36.4 C)  TempSrc: Oral Oral Oral   SpO2: 98% 97% (!) 76% 91%  Weight:      Height:        Intake/Output Summary (Last 24 hours) at  07/07/2019 1443 Last data filed at 07/06/2019 2200 Gross per 24 hour  Intake 704.33 ml  Output --  Net 704.33 ml   Filed Weights   07/04/19 1103  Weight: 89.9 kg   Examination: Physical Exam:  Constitutional: WN/WD obese Caucasian female currently in no acute distress appears a little bit anxious the Eyes: Lids and conjunctivae normal, sclerae anicteric  ENMT: External Ears, Nose appear normal. Grossly normal hearing Neck: Appears normal, supple, no cervical masses, normal ROM, no appreciable thyromegaly; no JVD Respiratory: Diminished to auscultation bilaterally with coarse breath sounds and some crackles in the right compared to left and some rhonchi.  She is wearing supplemental oxygen via nasal cannula and has unlabored breathing Cardiovascular: RRR, no murmurs / rubs / gallops. S1 and S2 auscultated.  Left leg is wrapped and right leg has some 1+ mild edema.  Right leg has extreme bruising Abdomen: Soft, non-tender, distended secondary body habitus. No masses palpated. No appreciable hepatosplenomegaly. Bowel sounds positive.  GU: Deferred. Musculoskeletal: No clubbing / cyanosis of digits/nails. No joint deformity upper and lower extremities.  Skin: Her right extremity is bruised and a little swollen and she does have a tattoo on her right leg.  Left leg is wrapped in Ace bandage and compression..  Neurologic: CN 2-12 grossly intact with no focal deficits. Romberg sign and cerebellar reflexes not assessed.  Psychiatric: Normal judgment and insight. Alert and oriented x 3. Normal mood and appropriate affect.    Data Reviewed: I have personally reviewed following labs and imaging studies  CBC: Recent Labs  Lab 07/04/19 1243 07/05/19 0419 07/06/19 0159  WBC 7.0 6.5 5.4  NEUTROABS 5.6  --  3.9  HGB 11.9* 11.9* 11.3*  HCT 36.9 37.5 36.0  MCV 90.7 90.1 92.8  PLT 181 203 154   Basic Metabolic Panel: Recent Labs  Lab 07/04/19 1243 07/05/19 0419 07/06/19 0159  NA 135 138 138   K 4.5 4.2 4.0  CL 95* 96* 97*  CO2 30 31 30   GLUCOSE 118* 108* 117*  BUN 25* 21 21  CREATININE 1.13* 0.91 1.04*  CALCIUM 9.2 9.2 9.1  MG  --   --  2.1  PHOS  --   --  3.4   GFR: Estimated Creatinine Clearance: 35.2 mL/min (A) (by C-G formula based on SCr of 1.04 mg/dL (H)). Liver Function Tests: Recent Labs  Lab 07/06/19 0159  AST 17  ALT 6  ALKPHOS 53  BILITOT 0.9  PROT 6.3*  ALBUMIN 3.4*   No results for input(s): LIPASE, AMYLASE in the last 168 hours. No results for input(s): AMMONIA in the last 168 hours. Coagulation Profile: Recent Labs  Lab 07/04/19 1243  INR 1.8*   Cardiac  Enzymes: No results for input(s): CKTOTAL, CKMB, CKMBINDEX, TROPONINI in the last 168 hours. BNP (last 3 results) No results for input(s): PROBNP in the last 8760 hours. HbA1C: No results for input(s): HGBA1C in the last 72 hours. CBG: No results for input(s): GLUCAP in the last 168 hours. Lipid Profile: No results for input(s): CHOL, HDL, LDLCALC, TRIG, CHOLHDL, LDLDIRECT in the last 72 hours. Thyroid Function Tests: No results for input(s): TSH, T4TOTAL, FREET4, T3FREE, THYROIDAB in the last 72 hours. Anemia Panel: No results for input(s): VITAMINB12, FOLATE, FERRITIN, TIBC, IRON, RETICCTPCT in the last 72 hours. Sepsis Labs: No results for input(s): PROCALCITON, LATICACIDVEN in the last 168 hours.  Recent Results (from the past 240 hour(s))  SARS Coronavirus 2 by RT PCR (hospital order, performed in [facility] hospital lab) Nasopharyngeal Nasopharyngeal Swab     Status: None   Collection Time: 07/04/19  3:54 PM   Specimen: Nasopharyngeal Swab  Result Value Ref Range Status   SARS Coronavirus 2 NEGATIVE NEGATIVE Final    Comment: (NOTE) SARS-CoV-2 target nucleic acids are NOT DETECTED. The SARS-CoV-2 RNA is generally detectable in upper and lower respiratory specimens during the acute phase of infection. The lowest concentration of SARS-CoV-2 viral copies this assay can detect  is 250 copies / mL. A negative result does not preclude SARS-CoV-2 infection and should not be used as the sole basis for treatment or other patient management decisions.  A negative result may occur with improper specimen collection / handling, submission of specimen other than nasopharyngeal swab, presence of viral mutation(s) within the areas targeted by this assay, and inadequate number of viral copies (<250 copies / mL). A negative result must be combined with clinical observations, patient history, and epidemiological information. Fact Sheet for Patients:   StrictlyIdeas.no Fact Sheet for Healthcare Providers: BankingDealers.co.za This test is not yet approved or cleared  by the [place] FDA and has been authorized for detection and/or diagnosis of SARS-CoV-2 by FDA under an Emergency Use Authorization (EUA).  This EUA will remain in effect (meaning this test can be used) for the duration of the COVID-19 declaration under Section 564(b)(1) of the Act, 21 U.S.C. section 360bbb-3(b)(1), unless the authorization is terminated or revoked sooner. Performed at [facility], [address] [place], [place] [address]      RN Pressure Injury Documentation:     Estimated body mass index is 36.25 kg/m as calculated from the following:   Height as of this encounter: 5\' 2"  (1.575 m).   Weight as of this encounter: 89.9 kg.  Malnutrition Type:  Nutrition Problem: Increased nutrient needs Etiology: cancer and cancer related treatments   Malnutrition Characteristics:  Signs/Symptoms: estimated needs   Nutrition Interventions:  Interventions: Ensure Enlive (each supplement provides 350kcal and 20 grams of protein)   Radiology Studies: PERIPHERAL VASCULAR CATHETERIZATION  Result Date: 07/06/2019 See op note  Scheduled Meds: . carvedilol  3.125 mg Oral BID  . cholecalciferol  1,000 Units Oral Daily  . feeding  supplement (ENSURE ENLIVE)  237 mL Oral BID BM  . losartan  50 mg Oral Daily  . mometasone-formoterol  2 puff Inhalation BID  . sodium chloride flush  3 mL Intravenous Q12H  . vitamin B-12  1,000 mcg Oral Daily   Continuous Infusions: . sodium chloride    . heparin 650 Units/hr (07/07/19 0017)    LOS: 3 days   Kerney Elbe, DO [facility] PAGER is on [place]  If 7PM-7AM, please contact night-coverage [email or web address]
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
## 2019-07-08 ENCOUNTER — Other Ambulatory Visit: Payer: Self-pay | Admitting: *Deleted

## 2019-07-08 ENCOUNTER — Inpatient Hospital Stay: Payer: Medicare Other

## 2019-07-08 DIAGNOSIS — C3431 Malignant neoplasm of lower lobe, right bronchus or lung: Secondary | ICD-10-CM

## 2019-07-08 LAB — COMPREHENSIVE METABOLIC PANEL
ALT: 5 U/L (ref 0–44)
AST: 17 U/L (ref 15–41)
Albumin: 3.2 g/dL — ABNORMAL LOW (ref 3.5–5.0)
Alkaline Phosphatase: 49 U/L (ref 38–126)
Anion gap: 10 (ref 5–15)
BUN: 12 mg/dL (ref 8–23)
CO2: 31 mmol/L (ref 22–32)
Calcium: 9 mg/dL (ref 8.9–10.3)
Chloride: 97 mmol/L — ABNORMAL LOW (ref 98–111)
Creatinine, Ser: 0.96 mg/dL (ref 0.44–1.00)
GFR calc Af Amer: 59 mL/min — ABNORMAL LOW (ref >60)
GFR calc non Af Amer: 51 mL/min — ABNORMAL LOW (ref >60)
Glucose, Bld: 106 mg/dL — ABNORMAL HIGH (ref 70–99)
Potassium: 3.9 mmol/L (ref 3.5–5.1)
Sodium: 138 mmol/L (ref 135–145)
Total Bilirubin: 0.8 mg/dL (ref 0.3–1.2)
Total Protein: 5.8 g/dL — ABNORMAL LOW (ref 6.5–8.1)

## 2019-07-08 LAB — CBC WITH DIFFERENTIAL/PLATELET
Abs Immature Granulocytes: 0.05 10*3/uL (ref 0.00–0.07)
Basophils Absolute: 0 10*3/uL (ref 0.0–0.1)
Basophils Relative: 0 %
Eosinophils Absolute: 0.2 10*3/uL (ref 0.0–0.5)
Eosinophils Relative: 3 %
HCT: 32.6 % — ABNORMAL LOW (ref 36.0–46.0)
Hemoglobin: 10.6 g/dL — ABNORMAL LOW (ref 12.0–15.0)
Immature Granulocytes: 1 %
Lymphocytes Relative: 7 %
Lymphs Abs: 0.4 10*3/uL — ABNORMAL LOW (ref 0.7–4.0)
MCH: 29.6 pg (ref 26.0–34.0)
MCHC: 32.5 g/dL (ref 30.0–36.0)
MCV: 91.1 fL (ref 80.0–100.0)
Monocytes Absolute: 0.6 10*3/uL (ref 0.1–1.0)
Monocytes Relative: 10 %
Neutro Abs: 4.5 10*3/uL (ref 1.7–7.7)
Neutrophils Relative %: 79 %
Platelets: 168 10*3/uL (ref 150–400)
RBC: 3.58 MIL/uL — ABNORMAL LOW (ref 3.87–5.11)
RDW: 16.1 % — ABNORMAL HIGH (ref 11.5–15.5)
WBC: 5.7 10*3/uL (ref 4.0–10.5)
nRBC: 0 % (ref 0.0–0.2)

## 2019-07-08 LAB — PH, BODY FLUID: pH, Body Fluid: 7.4

## 2019-07-08 LAB — TRIGLYCERIDES, BODY FLUIDS: Triglycerides, Fluid: 27 mg/dL

## 2019-07-08 LAB — MAGNESIUM: Magnesium: 1.8 mg/dL (ref 1.7–2.4)

## 2019-07-08 LAB — PHOSPHORUS: Phosphorus: 3.5 mg/dL (ref 2.5–4.6)

## 2019-07-08 MED ORDER — ONDANSETRON HCL 4 MG PO TABS: 4.0000 mg | ORAL_TABLET | Freq: Four times a day (QID) | ORAL | 0 refills | Status: AC | PRN

## 2019-07-08 MED ORDER — HYDROCODONE-ACETAMINOPHEN 5-325 MG PO TABS: 1.0000 | ORAL_TABLET | Freq: Four times a day (QID) | ORAL | 0 refills | Status: DC | PRN

## 2019-07-08 MED ORDER — ACETAMINOPHEN 325 MG PO TABS: 650.0000 mg | ORAL_TABLET | Freq: Four times a day (QID) | ORAL | 0 refills | Status: DC | PRN

## 2019-07-08 MED ORDER — ENSURE ENLIVE PO LIQD: 237.0000 mL | Freq: Two times a day (BID) | ORAL | 12 refills | Status: AC

## 2019-07-08 NOTE — Progress Notes (Signed)
   07/08/19 1015  Clinical Encounter Type  Visited With Patient  Visit Type Follow-up;Spiritual support  Referral From Chaplain  Consult/Referral To Chaplain  As chaplain walked in the room, patient said "hello [name]." Chaplain replied, you remembered me, patient said of course I remember you. After a short discussion, patient's nurse came in to give her medicine, but had to leave out for a minute. Chaplain prayed for patient while the nurse was out of the room. After chaplain finished praying, patient prayed for chaplain. Chaplain told patient that she will check on her whenever she is in. Patient said "please do come and see me everyday."

## 2019-07-08 NOTE — Discharge Summary (Signed)
Physician Discharge Summary  Gail Nelson DOB: Feb 08, 1923 DOA: 07/04/2019  PCP: Lavera Guise, MD  Admit date: 07/04/2019 Discharge date: 07/08/2019  Admitted From: Home Disposition: SNF  Recommendations for Outpatient Follow-up:  1. Follow up with PCP in 1-2 weeks 2. Follow up with Medical Oncology within 1 week and have Dr. Rogue Bussing arrange for PleurX Catheter 3. Follow up with IR within 1 week for PleurX Catheter 4. Follow-up with pulmonary Dr. Nathanial Rancher if necessary 5. Palliative Care to follow at SNF 6. Follow up with Vascular Surgery within 1-2 weeks  7. Please obtain CMP/CBC, Mag, Phos in one week 8. Please follow up on the following pending results:  Home Health: No  Equipment/Devices: None  Discharge Condition: Stable CODE STATUS: DO NOT RESUSCITATE Diet recommendation: Heart Healthy Diet   Brief/Interim Summary: HPI per Dr. [name] on 07/04/19 Gail Nelson a 86 y.o.femalewith medicalof lung cancersignificant for lung cancer(recently diagnosed during chemotherapy), hypertension, COPD, abdominal aortic aneurysm status post repair and DVT on Eliquis who presents to the ED with complaints of left leg swelling.Patient had an endoleak following AAA repair and had a hematoma involving her right lower extremity but that seems to have improved. She was started on Eliquis for her DVT and follow-up ultrasound shows progression of DVT now showing complete occlusion of the common femoral vein. Patient complains of chest discomfort which she describes as a pleuritic pain that occurs when she coughs. Patient had a CT angiogram which showsno evidence acute pulmonary embolism. Chronic large RIGHT pleural effusion with passive atelectasis of the RIGHT lung. Left leg venous Doppler showsin comparison with recent study, there is now essentially complete occlusion of the left common femoral vein and left saphenofemoral junction regions. Thrombus  is no longer appreciable in the left profunda femoral vein. There has been likely propagation of acute deep venous thrombosis more proximally into the left common femoral junction region. Note that this circumstance places patient at significant increased risk for pulmonary embolus development. She denies having any fever or chills, no cough, no dizziness or lightheadedness, no urinary symptoms or changes in her bowel habits.  ED Course:86 year old female with a history of lung cancer and recently diagnosed DVT who presents to the emergency room for worsening lower extremity swelling.Patient had a left leg venous Doppler which showed In comparison with recent study, there is now essentially complete occlusion of the left common femoral vein and left saphenofemoral junction regions. Thrombus is no longer appreciable in the left profunda femoral vein. There has been likely propagation of acute deep venous thrombosis more proximally into the left common femoral junction region. Note that this circumstance places patient at significant increased risk for pulmonary embolus development. Visualized proximal inferior vena cava appears patent without evident thrombus.  **Interim History Because of her persistent symptoms she underwent a mechanical thrombectomy with placement of an IVC filter.  Medical oncology was consulted for further anticoagulation recommendations and at their recommendation palliative care was also consulted for goals of care discussion and they recommend probable eventual hospice involvement at home and currently want to see if the patient can improve her functional status and PT OT now recommending SNF and she is stable to be discharged to SNF.   Medical oncology also recommending Pleurx catheter placement however this cannot be done so and instead will obtain a thoracentesis today and arrange for the proximal catheter to be done in outpatient setting.  I spoke with Dr. Rogue Bussing  and he does not feel that  this is a true failure of Eliquis and recommends changing to Eliquis after thoracentesis.  She was transitioned from heparin to Eliquis by vascular team and regular remove her compression dressing.  She is stable from a medical standpoint to be discharged and will need to follow-up with PCP, medical oncology, palliative care as well as vascular surgeon outpatient setting.  Discharge Diagnoses:  Principal Problem:   DVT (deep venous thrombosis) (HCC) Active Problems:   Chronic obstructive pulmonary disease (HCC)   Benign hypertension   Primary malignant neoplasm of right lower lobe of lung (HCC)   Palliative care encounter  LEFT Leg DVT -Initially thought to have Failed anticoagulant therapy (Eliquis) however after extensive discussion with her primary hematologist and oncologist he does not feel that she truly feels Eliquis and recommends discharging her back on Eliquis;DVT has extended and fully occluded left lower extremity common femoral vein and left saphenofemoral -Evaluated byDr. Margot Ables surgery on 5/31. Agreed with starting patient on heparin see note  -Vascular Surgery took the patient for undergoing mechanical thrombectomy with placement of an IVC filteron 6/2 due to continued LLE Pain and swelling; See below. Dr Lucky Cowboy preformed the procedure  -Per Vascular unable to use TPA due to the patient having cancer given increased risk of bleeding -Vascular Surgery recommending continuing Heparin and transitioning back to Eliquis post procedure but will discuss with Oncology about Anticoagulation recc's and they feel it is appropriate to resume Eliquis -C/w Heparin gtt for now and Dr. Rogue Bussing he initially felt that she will need Lovenox Injections but feels that family and patient may not be amenable. He feels that a GOC discussion should be had including hospice discussion and likely she will need hospice in outpatient setting so palliative care was  consulted. -Dr. Rogue Bussing to see the patient in the a.m. and he is also recommended palliative care consultation which is also been placed -Dr. Rogue Bussing saw the patient and feels that that she did not really feel Eliquis and recommends Eliquis at discharge and she is transition from heparin to Eliquis today by vascular but before she was transitioned she had a thoracentesis done for her large malignant likely pleural effusion. -Thoracentesis done yesterday -Leg Dressing to be removed today -Stable to be resume Eliquis and will need to follow up with Medical Oncology and Vascular Surgery within 1-2 weeks   RIGHT Lower Lobe stage IVAdenocarcinoma lung cancer with pleural effusion -Per Dr.Govinda Brahmanday Oncologynote from 06/20/2019 patient s/p radiation treatment5/07/2019. Also received Keytruda but was placed on hold secondary to side effects. -Has Chronic Large Right Pleural Effusion with Passive Atelectasis of the Right Lung -Patient was to follow-up in 3 weeks and will discuss with Dr. Rogue Bussing and he will see the patient in the a.m. -Medical oncology recommending Pleurx catheter to be placed however this cannot be done in the inpatient setting so they will be arranging to be done outpatient setting but instead she underwent thoracentesis yesterday and had 1.2 L of fluid removed -Repeat CXR this AM showed "Stable suspected small residual right pleural effusion. No new acute cardiopulmonary abnormality." -Will need PluerX in the outpatient setting and Dr. Rogue Bussing to arrange with Dr. Kathlene Cote -Continue with supportive care  Hypertension -C/w Coreg 3.125 mgBID -C/w Losartan 50 mg daily  CKD Stage 3a -Patient's BUN/Cr has remained relatively stable and is 12/0.96 today  -Continue to monitor and trend -Stopped IV fluid hydration -Avoid Nephrotoxic Medications, Contrast Dyes, Hypotension and Renally adjust Medications -Repeat CMP within 1 week  AAA s/p Repair -  Follow-up in  the outpatient setting  COPD -Continue as needed bronchodilator therapy with albuterol 2.5 mg nebs every 6 hours as needed for wheezing as well as mometasone-formoterol 2 puffs elation twice daily while hospitalized and resume home Inhalers at D/C -Wearing 2 Liters of Supplemental O2; Wears it at night usually   Normocytic Anemia -Patient's Hgb/Hct went from 11.9/36.9 -> 11.3/36.0 and today it is 10.6/32.6 -Check Anemia Panel and showed an iron level of 39, U IBC of 252, TIBC of 291, saturation ratios of 13%, ferritin level of 110, folate level 5.9, vitamin B12 of 3183 -Continue to Monitor for S/Sx of Bleeding as she is Anticoagulated with Heparin gtt; Currently no overt bleeding noted -Repeat CBC in AM  Obesity -Estimated body mass index is 36.25 kg/m as calculated from the following:   Height as of this encounter: 5\' 2"  (1.575 m).   Weight as of this encounter: 89.9 kg. -Nutritionist consulted and recommending Ensure Enlive po BID  GOC: DNR, poA -Palliative care consulted for further evaluation recommendations given medical oncology recommendations -Likely will need hospice discussion at some point given Oncology's concerned that she is not going to do well long-term -Palliative care consulted and they spoke with the patient and the patient's daughter and the patient daughter does not feel the patient can return home at the time of discharge and recommends going to rehab for PT OT but ultimately they would be in agreement with the patient going home with hospice supportive care eventually; per palliative patient's daughter is frustrated by patient's recent medical care and patient's daughter only wants to primarily focus on improving the patient's performance status to ensure that she has the highest quality of life going forward; She is stable to D/C to SNF and will be discharged once bed is available and Ship broker recieved  Discharge Instructions  Discharge  Instructions    Call MD for:  difficulty breathing, headache or visual disturbances   Complete by: As directed    Call MD for:  extreme fatigue   Complete by: As directed    Call MD for:  hives   Complete by: As directed    Call MD for:  persistant dizziness or light-headedness   Complete by: As directed    Call MD for:  persistant nausea and vomiting   Complete by: As directed    Call MD for:  redness, tenderness, or signs of infection (pain, swelling, redness, odor or green/yellow discharge around incision site)   Complete by: As directed    Call MD for:  severe uncontrolled pain   Complete by: As directed    Call MD for:  temperature >100.4   Complete by: As directed    Diet - low sodium heart healthy   Complete by: As directed    Discharge instructions   Complete by: As directed    You were cared for by a hospitalist during your hospital stay. If you have any questions about your discharge medications or the care you received while you were in the hospital after you are discharged, you can call the unit and ask to speak with the hospitalist on call if the hospitalist that took care of you is not available. Once you are discharged, your primary care physician will handle any further medical issues. Please note that NO REFILLS for any discharge medications will be authorized once you are discharged, as it is imperative that you return to your primary care physician (or establish a relationship with a primary care  physician if you do not have one) for your aftercare needs so that they can reassess your need for medications and monitor your lab values.  Follow up with PCP, Medical Oncology, Palliative Care, and Vascular Surgery. Take all medications as prescribed. If symptoms change or worsen please return to the ED for evaluation   Increase activity slowly   Complete by: As directed      Allergies as of 07/08/2019      Reactions   Augmentin [amoxicillin-pot Clavulanate]    Reaction  unknown   Clarithromycin Other (See Comments)   "Tongue gets cover with fuzz"   Other    bioxin causes tongue to "grow fur"   Oxycodone Other (See Comments)   Penicillins Swelling   Tramadol Other (See Comments)      Medication List    STOP taking these medications   chlorpheniramine-HYDROcodone 10-8 MG/5ML Suer Commonly known as: Tussionex Pennkinetic ER   clotrimazole-betamethasone cream Commonly known as: LOTRISONE     TAKE these medications   acetaminophen 325 MG tablet Commonly known as: TYLENOL Take 2 tablets (650 mg total) by mouth every 6 (six) hours as needed for mild pain (or Fever >/= 101).   albuterol (2.5 MG/3ML) 0.083% nebulizer solution Commonly known as: PROVENTIL Take 3 mLs (2.5 mg total) by nebulization every 6 (six) hours as needed for wheezing or shortness of breath.   Apixaban Starter Pack (10mg  and 5mg ) Commonly known as: ELIQUIS STARTER PACK Take as directed on package: start with two-5mg  tablets twice daily for 7 days. On day 8, switch to one-5mg  tablet twice daily.   carvedilol 3.125 MG tablet Commonly known as: COREG Take 1 tablet (3.125 mg total) by mouth 2 (two) times daily.   cholecalciferol 1000 units tablet Commonly known as: VITAMIN D Take 1,000 Units by mouth daily.   feeding supplement (ENSURE ENLIVE) Liqd Take 237 mLs by mouth 2 (two) times daily between meals.   fluticasone 50 MCG/ACT nasal spray Commonly known as: FLONASE Place 2 sprays into both nostrils daily.   Fluticasone-Salmeterol 250-50 MCG/DOSE Aepb Commonly known as: Advair Diskus Inhale 1 puff into the lungs 2 (two) times daily.   HYDROcodone-acetaminophen 5-325 MG tablet Commonly known as: Norco Take 1 tablet by mouth every 6 (six) hours as needed for moderate pain.   KRILL OIL PO Take by mouth.   losartan 50 MG tablet Commonly known as: COZAAR TAKE 1 TABLET(50 MG) BY MOUTH DAILY   Manganese 10 MG Tabs Take 1 tablet by mouth daily.   ondansetron 4 MG  tablet Commonly known as: ZOFRAN Take 1 tablet (4 mg total) by mouth every 6 (six) hours as needed for nausea.   OXYGEN Inhale into the lungs. 2 liters at night   potassium gluconate 595 (99 K) MG Tabs tablet Take 595 mg by mouth daily.   vitamin B-12 1000 MCG tablet Commonly known as: CYANOCOBALAMIN Take 1,000 mcg by mouth daily.   vitamin E 180 MG (400 UNITS) capsule Take 400 Units by mouth daily.       Contact information for follow-up providers    Dew, Erskine Squibb, MD Follow up in 1 week(s).   Specialties: Vascular Surgery, Radiology, Interventional Cardiology Why: Can see Dew or Arna Medici. Will need left lower extremity DVT study with visit.  Contact information: [place] [address] [phone]            Contact information for after-discharge care    [facility] .  Service: Skilled Nursing Contact information: [place] [phone]                 Allergies  Allergen Reactions  . Augmentin [Amoxicillin-Pot Clavulanate]     Reaction unknown  . Clarithromycin Other (See Comments)    "Tongue gets cover with fuzz"  . Other     bioxin causes tongue to "grow fur"  . Oxycodone Other (See Comments)  . Penicillins Swelling  . Tramadol Other (See Comments)   Consultations:  Vascular Surgery Dr. Harold Barban and Dr. Lucky Cowboy  Medical Oncology Dr. Rogue Bussing  Palliative Care Medicine   Procedures/Studies: DG Chest 1 View  Result Date: 07/08/2019 CLINICAL DATA:  86 year old female with shortness of breath. Malignant recurrent right pleural effusion. EXAM: CHEST  1 VIEW COMPARISON:  Portable chest 07/07/2019. FINDINGS: Portable AP upright view at 0505 hours. Stable lung volumes and mediastinal contours. Calcified aortic atherosclerosis again noted. Visualized tracheal air column is within normal limits. No pneumothorax. Small residual right pleural effusion  suspected. Patchy right lung base opacity not significantly changed. No areas of worsening ventilation. Stable visualized osseous structures. IMPRESSION: 1. Stable suspected small residual right pleural effusion. 2. No new acute cardiopulmonary abnormality. Electronically Signed   By: Genevie Ann M.D.   On: 07/08/2019 08:08   DG Chest 1 View  Result Date: 07/07/2019 CLINICAL DATA:  Malignant recurrent right pleural effusion and status post thoracentesis. EXAM: CHEST  1 VIEW COMPARISON:  CTA of the chest on 07/04/2019 FINDINGS: Stable heart size and tortuosity of the thoracic aorta. No significant residual right pleural fluid after thoracentesis. No pneumothorax. Basilar opacity on the right likely related to known lung mass. No pulmonary edema. IMPRESSION: No significant residual right pleural fluid after thoracentesis. No pneumothorax. Electronically Signed   By: Aletta Edouard M.D.   On: 07/07/2019 16:21   CT Angio Chest PE W/Cm &/Or Wo Cm  Result Date: 07/04/2019 CLINICAL DATA:  LEFT leg swelling.  Short of breath. EXAM: CT ANGIOGRAPHY CHEST WITH CONTRAST TECHNIQUE: Multidetector CT imaging of the chest was performed using the standard protocol during bolus administration of intravenous contrast. Multiplanar CT image reconstructions and MIPs were obtained to evaluate the vascular anatomy. CONTRAST:  61mL OMNIPAQUE IOHEXOL 350 MG/ML SOLN COMPARISON:  Doppler ultrasound lower extremities 07/04/2019, CT angiogram chest 06/10/2019. FINDINGS: Cardiovascular: No filling defects within the pulmonary arteries to suggest acute pulmonary embolism. Coronary artery calcification and aortic atherosclerotic calcification. Mediastinum/Nodes: No axillary supraclavicular adenopathy. No mediastinal hilar adenopathy no pericardial effusion. Esophagus normal Lungs/Pleura: Large layering RIGHT pleural effusion similar to comparison CT. There is passive atelectasis of the RIGHT lower lobe. No pulmonary infarction identified.  Atelectasis within the RIGHT middle lobe is also similar prior. Upper Abdomen: Limited view of the liver, kidneys, pancreas are unremarkable. Normal adrenal glands. Musculoskeletal: No aggressive osseous lesion. Review of the MIP images confirms the above findings. IMPRESSION: 1. No evidence acute pulmonary embolism. 2. Chronic large RIGHT pleural effusion with passive atelectasis of the RIGHT lung. Electronically Signed   By: Suzy Bouchard M.D.   On: 07/04/2019 14:35   CT ANGIO AO+BIFEM W & OR WO CONTRAST  Result Date: 06/10/2019 CLINICAL DATA:  Soft tissue mass at the thigh with spontaneous hemorrhage. Possible right femoral artery injury status post endovascular intervention. EXAM: CT ANGIOGRAPHY OF ABDOMINAL AORTA WITH ILIOFEMORAL RUNOFF TECHNIQUE: Multidetector CT imaging of the abdomen, pelvis and lower extremities was performed using the standard protocol during bolus administration of intravenous contrast. Multiplanar CT  image reconstructions and MIPs were obtained to evaluate the vascular anatomy. CONTRAST:  123mL OMNIPAQUE IOHEXOL 350 MG/ML SOLN COMPARISON:  None. CT dated 06/01/2019.  PET-CT dated Jun 09, 2019 FINDINGS: VASCULAR Aorta: There is a large abdominal aortic aneurysm measuring approximately 6.6 x 6.6 cm. The patient is status post EVAR. There appears to be a persistent type 2 endoleak likely arising from a lumbar artery. Celiac: Patent without evidence of aneurysm, dissection, vasculitis or significant stenosis. SMA: Patent without evidence of aneurysm, dissection, vasculitis or significant stenosis. Renals: Both renal arteries are patent without evidence of aneurysm, dissection, vasculitis, fibromuscular dysplasia or significant stenosis. IMA: The IMA is not appreciated and may be occluded. RIGHT Lower Extremity Inflow: Common, internal and external iliac arteries are patent without evidence of aneurysm, dissection, vasculitis or significant stenosis. Outflow: Common, superficial and  profunda femoral arteries and the popliteal artery are patent without evidence of aneurysm, dissection, vasculitis or significant stenosis. Runoff: The proximal anterior tibial, posterior tibial, and peroneal arteries appear to be patent, however there is suboptimal opacification above the knee which may be in part due to contrast bolus timing. LEFT Lower Extremity Inflow: Common, internal and external iliac arteries are patent without evidence of aneurysm, dissection, vasculitis or significant stenosis. Outflow: Common, superficial and profunda femoral arteries and the popliteal artery are patent without evidence of aneurysm, dissection, vasculitis or significant stenosis. There is a small amount of hemorrhage about the left common femoral artery. Runoff: The anterior tibial artery, posterior tibial artery, and peroneal arteries appear to be patent proximally. However, the distal aspects are suboptimally evaluated which is felt to be secondary to suboptimal contrast bolus timing. Veins: No obvious venous abnormality within the limitations of this arterial phase study. Review of the MIP images confirms the above findings. NON-VASCULAR Lower chest: There is a moderate to large, partially visualized right-sided pleural effusion with adjacent compressive atelectasis.There is cardiomegaly. Vascular calcifications are noted of the partially visualized thoracic aorta. Hepatobiliary: The liver is normal. Status post cholecystectomy.There is no biliary ductal dilation. Pancreas: Normal contours without ductal dilatation. No peripancreatic fluid collection. Spleen: Unremarkable. Adrenals/Urinary Tract: --Adrenal glands: Unremarkable. --Right kidney/ureter: No hydronephrosis or radiopaque kidney stones. --Left kidney/ureter: There is a large exophytic cyst arising from the lower pole of the left kidney measuring approximately 5 cm. --Urinary bladder: A bladder diverticulum is noted. Stomach/Bowel: --Stomach/Duodenum: No  hiatal hernia or other gastric abnormality. Normal duodenal course and caliber. --Small bowel: Unremarkable. --Colon: Rectosigmoid diverticulosis without acute inflammation. --Appendix: Normal. Lymphatic: --No retroperitoneal lymphadenopathy. --No mesenteric lymphadenopathy. --No pelvic or inguinal lymphadenopathy. Reproductive: Status post hysterectomy. No adnexal mass. Other: No ascites or free air. The abdominal wall is normal. Musculoskeletal. There is a large hematoma involving the proximal right thigh measuring approximately 9.7 x 3.7 by 10 point 8 cm. There is no evidence for active extravasation at this time. There is significant surrounding soft tissue edema. There are metallic clips within the bilateral inguinal regions. There is hyperdensity within the perisacral musculature. This is favored to represent contrast extravasation from the patient's recent angiogram. There is a moderate size right knee joint effusion. Degenerative changes are noted of the right knee. Metallic coils are now noted at the level of the L5 vertebral body and sacrum. These are likely related to the recent endovascular intervention. IMPRESSION: 1. Large proximal right thigh hematoma without evidence for active extravasation. There are small metallic clips are noted within the soft tissues of the bilateral inguinal region likely representing failure of the Starclose closure. 2.  The patient is status post EVAR. The aneurysmal sac demonstrates slight interval increase in size since the prior study dated 06/01/2019, now measuring 6.6 x 6.6 cm. There is a persistent type 2 endoleak that is favored to be arising from a lumbar artery. 3. Large, partially visualized right-sided pleural effusion with adjacent atelectasis. 4. Unremarkable runoff to the level of the mid calf bilaterally. There is suboptimal opacification of the tibial vasculature distally which is felt to be secondary to suboptimal contrast bolus timing. Correlation with the  patient's pedal pulses is recommended. 5. Diverticulosis without CT evidence for diverticulitis. Aortic Atherosclerosis (ICD10-I70.0). These results will be called to the ordering clinician or representative by the Radiologist Assistant, and communication documented in the PACS or [employer]. Electronically Signed   By: Constance Holster M.D.   On: 06/10/2019 19:38   PERIPHERAL VASCULAR CATHETERIZATION  Result Date: 07/06/2019 See op note  PERIPHERAL VASCULAR CATHETERIZATION  Result Date: 06/10/2019 See op note  NM PET Image Restag (PS) Skull Base To Thigh  Result Date: 06/09/2019 CLINICAL DATA:  Subsequent treatment strategy for right lower lobe lung cancer. EXAM: NUCLEAR MEDICINE PET SKULL BASE TO THIGH TECHNIQUE: 11.1 mCi F-18 FDG was injected intravenously. Full-ring PET imaging was performed from the skull base to thigh after the radiotracer. CT data was obtained and used for attenuation correction and anatomic localization. Fasting blood glucose: 104 mg/dl COMPARISON:  Chest CT 06/01/2019.  PET-CT 02/28/2019. FINDINGS: Mediastinal blood pool activity: SUV max 3.0 Liver activity: SUV max NA NECK: No hypermetabolic lymph nodes in the neck. Incidental CT findings: none CHEST: Persistent hypermetabolism noted in the right lower lobe mass with SUV max = 5.8 today compared to 11.3 previously. Lesion has decreased in size since previous PET-CT measuring 2.9 x 1.2 cm today, not substantially changed since chest CT of 06/01/2019. Right hilar hypermetabolism seen on the previous study has resolved although there is a 11 mm precarinal node (image 67/3) demonstrating low level hypermetabolism with SUV max = 3.3. Incidental CT findings: Small to moderate right pleural effusion noted. There is right lower lobe collapse/consolidation with subsegmental atelectasis in the right middle and left lower lobes. ABDOMEN/PELVIS: No abnormal hypermetabolic activity within the liver, pancreas, adrenal glands, or spleen.  No hypermetabolic lymph nodes in the abdomen or pelvis. 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 Prominent cyst noted lower pole left kidney. Left-sided diverticulosis without diverticulitis. SKELETON: No focal hypermetabolic activity to suggest skeletal metastasis. Incidental CT findings: none IMPRESSION: 1. Decreased hypermetabolism and decreased size of the right lower lobe pulmonary mass comparing to prior PET-CT. 2. The hypermetabolic right infrahilar metastatic disease seen on the previous PET-CT has resolved but there is a new mildly enlarged precarinal node showing low level hypermetabolism on today's study concerning for metastatic involvement. 3.  Aortic Atherosclerois (ICD10-170.0) Electronically Signed   By: Misty Stanley M.D.   On: 06/09/2019 12:04   US Venous Img Lower Unilateral Left  Result Date: 07/04/2019 CLINICAL DATA:  Soft tissue swelling EXAM: LEFT LOWER EXTREMITY VENOUS DUPLEX ULTRASOUND TECHNIQUE: Gray-scale sonography with graded compression, as well as color Doppler and duplex ultrasound were performed to evaluate the left lower extremity deep venous system from the level of the common femoral vein and including the common femoral, femoral, profunda femoral, popliteal and calf veins including the posterior tibial, peroneal and gastrocnemius veins when visible. The superficial great saphenous vein was also interrogated. Spectral Doppler was utilized  to evaluate flow at rest and with distal augmentation maneuvers in the common femoral, femoral and popliteal veins. COMPARISON:  Jul 01, 2019 FINDINGS: Contralateral Common Femoral Vein: There is acute appearing thrombus in the left common femoral vein with loss of compressibility and Doppler signal. Common Femoral Vein: There is absence of flow at the left saphenofemoral junction with loss of apparent flow and compressibility. Saphenofemoral  Junction: No evidence of thrombus. Normal compressibility and flow on color Doppler imaging. Profunda Femoral Vein: No evidence of thrombus. Normal compressibility and flow on color Doppler imaging. Femoral Vein: No evidence of thrombus. Normal compressibility, respiratory phasicity and response to augmentation. Popliteal Vein: No evidence of thrombus. Normal compressibility, respiratory phasicity and response to augmentation. Calf Veins: No thrombus evident in the posterior tibial vein. There is incomplete obstruction of the left peroneal vein with diminished flow and diminished compressibility. Superficial Great Saphenous Vein: No evidence of thrombus. Normal compressibility. Venous Reflux:  None. Other Findings:  Patency of the proximal inferior vena cava noted. IMPRESSION: In comparison with recent study, there is now essentially complete occlusion of the left common femoral vein and left saphenofemoral junction regions. Thrombus is no longer appreciable in the left profunda femoral vein. There has been likely propagation of acute deep venous thrombosis more proximally into the left common femoral junction region. Note that this circumstance places patient at significant increased risk for pulmonary embolus development. Visualized proximal inferior vena cava appears patent without evident thrombus. Electronically Signed   By: Lowella Grip III M.D.   On: 07/04/2019 12:08   US Venous Img Lower Unilateral Left  Result Date: 07/01/2019 CLINICAL DATA:  Left leg pain and swelling. EXAM: LEFT LOWER EXTREMITY VENOUS DOPPLER ULTRASOUND TECHNIQUE: Gray-scale sonography with graded compression, as well as color Doppler and duplex ultrasound were performed to evaluate the lower extremity deep venous systems from the level of the common femoral vein and including the common femoral, femoral, profunda femoral, popliteal and calf veins including the posterior tibial, peroneal and gastrocnemius veins when visible. The  superficial great saphenous vein was also interrogated. Spectral Doppler was utilized to evaluate flow at rest and with distal augmentation maneuvers in the common femoral, femoral and popliteal veins. COMPARISON:  07/02/2004 FINDINGS: Contralateral Common Femoral Vein: Respiratory phasicity is normal and symmetric with the symptomatic side. No evidence of thrombus. Normal compressibility. Common Femoral Vein: Nonocclusive thrombus. Saphenofemoral Junction: Nonocclusive thrombus. Profunda Femoral Vein: Nonocclusive thrombus. Femoral Vein: No evidence of thrombus. Normal compressibility, respiratory phasicity and response to augmentation. Popliteal Vein: No evidence of thrombus. Normal compressibility, respiratory phasicity and response to augmentation. Calf Veins: No evidence of thrombus. Normal compressibility and flow on color Doppler imaging. Superficial Great Saphenous Vein: No evidence of thrombus. Normal compressibility. Venous Reflux:  None. Other Findings:  None. IMPRESSION: Nonocclusive deep venous thrombosis involving the left common femoral vein, profunda femoral vein and saphenofemoral venous junction. Electronically Signed   By: Claudie Revering M.D.   On: 07/01/2019 17:54   US THORACENTESIS ASP PLEURAL SPACE W/IMG GUIDE  Result Date: 07/07/2019 CLINICAL DATA:  Malignant right pleural effusion. EXAM: ULTRASOUND GUIDED RIGHT THORACENTESIS COMPARISON:  CTA of the chest on 07/04/2019 PROCEDURE: An ultrasound guided thoracentesis was thoroughly discussed with the patient and questions answered. The benefits, risks, alternatives and complications were also discussed. The patient understands and wishes to proceed with the procedure. Written consent was obtained. Ultrasound was performed to localize and mark an adequate pocket of fluid in the right chest. The area was then prepped and  draped in the normal sterile fashion. 1% Lidocaine was used for local anesthesia. Under ultrasound guidance a 6 French  Safe-T-Centesis catheter was introduced. Thoracentesis was performed. The catheter was removed and a dressing applied. COMPLICATIONS: None FINDINGS: A total of approximately 1.2 L of bloody fluid was removed. IMPRESSION: Successful ultrasound guided right thoracentesis yielding 1.2 L of pleural fluid. Electronically Signed   By: Aletta Edouard M.D.   On: 07/07/2019 16:16    LLE VENOUS DUPLEX 07/04/19 IMPRESSION: In comparison with recent study, there is now essentially complete occlusion of the left common femoral vein and left saphenofemoral junction regions. Thrombus is no longer appreciable in the left profunda femoral vein. There has been likely propagation of acute deep venous thrombosis more proximally into the left common femoral junction region. Note that this circumstance places patient at significant increased risk for pulmonary embolus development.  Visualized proximal inferior vena cava appears patent without evident thrombus.  PROCEDURE 07/06/19 by Dr. Lucky Cowboy 1. US guidance for vascular access to left popliteal vein 2. Catheter placement into left common iliac veinfrom left poplitealapproach 3. IVC gram and left lower extremityvenogram 4. Mechanical thrombectomy to proximal left superficial femoral vein, common femoral vein, external and common iliac veins with the penumbra CAT 12 device 5. PTA of left external and common iliac veins with 10 mm diameter angioplasty balloon  Thoracentesis was done 07/07/2019 and drained 1.2 L of bloody fluid from the right pleural space  Subjective: Seen and Examined at bedside and states that she is doing much better today and feels little bit stronger.  States that her respiratory status is doing good and not complaining of much pain in her leg.  No lightheadedness or dizziness.  Wants to go to rehab before going home and stable for discharge.  No other concerns or plans at this time.  Discharge Exam: Vitals:   07/07/19 2211  07/08/19 0728  BP: 136/70 129/80  Pulse: 94 86  Resp: 18 17  Temp: 98 F (36.7 C) 98.3 F (36.8 C)  SpO2: 97% 98%   Vitals:   07/07/19 1652 07/07/19 1758 07/07/19 2211 07/08/19 0728  BP: (!) 144/103 135/67 136/70 129/80  Pulse: 97 (!) 103 94 86  Resp:  20 18 17   Temp: 99 F (37.2 C) 98.7 F (37.1 C) 98 F (36.7 C) 98.3 F (36.8 C)  TempSrc: Oral Oral Oral   SpO2: 95% 98% 97% 98%  Weight:      Height:       General: Pt is alert, awake, not in acute distress Cardiovascular: RRR, S1/S2 +, no rubs, no gallops Respiratory: Diminished bilaterally with coarse breath sounds worse on the right compared to left, no wheezing, no rhonchi; wearing supplemental oxygen via nasal cannula Abdominal: Soft, NT, distended secondary body habitus, bowel sounds + Extremities: Trace edema and has some bruising on her right leg and left leg is wrapped in [place], no cyanosis  The results of significant diagnostics from this hospitalization (including imaging, microbiology, ancillary and laboratory) are listed below for reference.    Microbiology: Recent Results (from the past 240 hour(s))  SARS Coronavirus 2 by RT PCR (hospital order, performed in [facility] hospital lab) Nasopharyngeal Nasopharyngeal Swab     Status: None   Collection Time: 07/04/19  3:54 PM   Specimen: Nasopharyngeal Swab  Result Value Ref Range Status   SARS Coronavirus 2 NEGATIVE NEGATIVE Final    Comment: (NOTE) SARS-CoV-2 target nucleic acids are NOT DETECTED. The SARS-CoV-2 RNA is generally detectable in  upper and lower respiratory specimens during the acute phase of infection. The lowest concentration of SARS-CoV-2 viral copies this assay can detect is 250 copies / mL. A negative result does not preclude SARS-CoV-2 infection and should not be used as the sole basis for treatment or other patient management decisions.  A negative result may occur with improper specimen collection / handling, submission of specimen  other than nasopharyngeal swab, presence of viral mutation(s) within the areas targeted by this assay, and inadequate number of viral copies (<250 copies / mL). A negative result must be combined with clinical observations, patient history, and epidemiological information. Fact Sheet for Patients:   StrictlyIdeas.no Fact Sheet for Healthcare Providers: BankingDealers.co.za This test is not yet approved or cleared  by the [place] FDA and has been authorized for detection and/or diagnosis of SARS-CoV-2 by FDA under an Emergency Use Authorization (EUA).  This EUA will remain in effect (meaning this test can be used) for the duration of the COVID-19 declaration under Section 564(b)(1) of the Act, 21 U.S.C. section 360bbb-3(b)(1), unless the authorization is terminated or revoked sooner. Performed at [facility], [address] [place], [place] [address]   Body fluid culture     Status: None (Preliminary result)   Collection Time: 07/07/19  4:00 PM   Specimen: PATH Cytology Peritoneal fluid  Result Value Ref Range Status   Specimen Description   Final    PERITONEAL Performed at [facility], [address], [place], [place] [address]    Special Requests   Final    NONE Performed at [facility], [address] [place], [place] [address]    Gram Stain PENDING  Incomplete   Culture   Final    NO GROWTH < 24 HOURS Performed at [facility], [place] [address], [place], [place] [address]    Report Status PENDING  Incomplete    Labs: BNP (last 3 results) Recent Labs    07/04/19 1243  BNP 44.0   Basic Metabolic Panel: Recent Labs  Lab 07/04/19 1243 07/05/19 0419 07/06/19 0159 07/07/19 0800 07/08/19 0615  NA 135 138 138 140 138  K 4.5 4.2 4.0 4.2 3.9  CL 95* 96* 97* 99 97*  CO2 30 31 30 31 31   GLUCOSE 118* 108* 117* 106* 106*  BUN 25* 21 21 13 12   CREATININE 1.13* 0.91 1.04* 0.83 0.96   CALCIUM 9.2 9.2 9.1 9.1 9.0  MG  --   --  2.1 2.0 1.8  PHOS  --   --  3.4 3.5 3.5   Liver Function Tests: Recent Labs  Lab 07/06/19 0159 07/07/19 0800 07/08/19 0615  AST 17 16 17   ALT 6 6 5   ALKPHOS 53 50 49  BILITOT 0.9 0.8 0.8  PROT 6.3* 6.2* 5.8*  ALBUMIN 3.4* 3.3* 3.2*   No results for input(s): LIPASE, AMYLASE in the last 168 hours. No results for input(s): AMMONIA in the last 168 hours. CBC: Recent Labs  Lab 07/04/19 1243 07/05/19 0419 07/06/19 0159 07/07/19 0800 07/08/19 0615  WBC 7.0 6.5 5.4 5.2 5.7  NEUTROABS 5.6  --  3.9 3.8 4.5  HGB 11.9* 11.9* 11.3* 10.6* 10.6*  HCT 36.9 37.5 36.0 33.9* 32.6*  MCV 90.7 90.1 92.8 92.4 91.1  PLT 181 203 183 179 168   Cardiac Enzymes: No results for input(s): CKTOTAL, CKMB, CKMBINDEX, TROPONINI in the last 168 hours. BNP: Invalid input(s): POCBNP CBG: No results for input(s): GLUCAP in the last 168 hours.  D-Dimer No results for input(s): DDIMER in the last 72 hours. Hgb A1c No results for input(s): HGBA1C in the last 72 hours. Lipid Profile No results for input(s): CHOL, HDL, LDLCALC, TRIG, CHOLHDL, LDLDIRECT in the last 72 hours. Thyroid function studies No results for input(s): TSH, T4TOTAL, T3FREE, THYROIDAB in the last 72 hours.  Invalid input(s): FREET3 Anemia work up Recent Labs    07/07/19 0800  VITAMINB12 3,183*  FOLATE 5.9*  FERRITIN 110  TIBC 291  IRON 39  RETICCTPCT 2.6   Urinalysis    Component Value Date/Time   APPEARANCEUR Clear 03/14/2019 0000   GLUCOSEU Negative 03/14/2019 0000   BILIRUBINUR Negative 03/14/2019 0000   PROTEINUR Negative 03/14/2019 0000   UROBILINOGEN 0.2 01/14/2018 1406   NITRITE Negative 03/14/2019 0000   LEUKOCYTESUR Negative 03/14/2019 0000   Sepsis Labs Invalid input(s): PROCALCITONIN,  WBC,  LACTICIDVEN Microbiology Recent Results (from the past 240 hour(s))  SARS Coronavirus 2 by RT PCR (hospital order, performed in [place] hospital lab) Nasopharyngeal  Nasopharyngeal Swab     Status: None   Collection Time: 07/04/19  3:54 PM   Specimen: Nasopharyngeal Swab  Result Value Ref Range Status   SARS Coronavirus 2 NEGATIVE NEGATIVE Final    Comment: (NOTE) SARS-CoV-2 target nucleic acids are NOT DETECTED. The SARS-CoV-2 RNA is generally detectable in upper and lower respiratory specimens during the acute phase of infection. The lowest concentration of SARS-CoV-2 viral copies this assay can detect is 250 copies / mL. A negative result does not preclude SARS-CoV-2 infection and should not be used as the sole basis for treatment or other patient management decisions.  A negative result may occur with improper specimen collection / handling, submission of specimen other than nasopharyngeal swab, presence of viral mutation(s) within the areas targeted by this assay, and inadequate number of viral copies (<250 copies / mL). A negative result must be combined with clinical observations, patient history, and epidemiological information. Fact Sheet for Patients:   StrictlyIdeas.no Fact Sheet for Healthcare Providers: BankingDealers.co.za This test is not yet approved or cleared  by the [place] FDA and has been authorized for detection and/or diagnosis of SARS-CoV-2 by FDA under an Emergency Use Authorization (EUA).  This EUA will remain in effect (meaning this test can be used) for the duration of the COVID-19 declaration under Section 564(b)(1) of the Act, 21 U.S.C. section 360bbb-3(b)(1), unless the authorization is terminated or revoked sooner. Performed at [facility], [address] [place], [place] [address]   Body fluid culture     Status: None (Preliminary result)   Collection Time: 07/07/19  4:00 PM   Specimen: PATH Cytology Peritoneal fluid  Result Value Ref Range Status   Specimen Description   Final    PERITONEAL Performed at [facility], [address], [place], [place] [address]    Special Requests   Final    NONE Performed at [facility], [address] [place], [place] [address]    Gram Stain PENDING  Incomplete   Culture   Final    NO GROWTH < 24 HOURS Performed at [facility], [place] [address] [place], [place] [address]    Report Status PENDING  Incomplete   Time coordinating discharge: 35 minutes  SIGNED:  Kerney Elbe, DO [facility] 07/08/2019, 12:59 PM Pager is on [place]  If 7PM-7AM, please contact night-coverage [email or web address]

## 2019-07-08 NOTE — TOC Transition Note (Signed)
Transition of Care [facility]) - CM/SW Discharge Note   Patient Details  Name: Gail Nelson MRN: [id number] Date of Birth: 08-01-1923  [facility] CM/SW Contact:  Shelbie Ammons, RN Phone Number: 07/08/2019, 1:48 PM   Clinical Narrative:   RNCM spoke with patient's daughter Thayer Headings and beds were presented, she chooses [facility] in [place]. Patient is managed by Candace Cruise request initiated, ref# [phone], clinicals faxed to [phone]. RNCM will continue to monitor.       Barriers to Discharge: Continued Medical Work up   Patient Goals and CMS Choice        Discharge Placement                       Discharge Plan and Services   Discharge Planning Services: CM Consult Post Acute Care Choice: [place]                               Social Determinants of Health (SDOH) Interventions     Readmission Risk Interventions No flowsheet data found.

## 2019-07-08 NOTE — Progress Notes (Signed)
Physical Therapy Treatment Patient Details Name: Gail Nelson MRN: [id number] DOB: 1924/01/29 Today's Date: 07/08/2019    History of Present Illness 86 year old female who presented with an extensive left lower extremity DVT status post thrombectomy on 07/06/19. PMHx includes COPD, HTN, RLL malignancy.    PT Comments    Patient received in bathroom, NT present. Agrees to PT. Reports she is so bored in the room and happy to walk. Patient demonstrates improved balance since yesterday. Able to walk some without UE support, other times holds to rail for support in hallway. Ambulated 200 feet. She required 2 brief standing rest breaks and O2 sats remained at 88% or above throughout. She will continue to benefit from skilled PT while here to improve strength, activity tolerance and balance for safe return home.        Follow Up Recommendations  SNF;Supervision for mobility/OOB     Equipment Recommendations  None recommended by PT    Recommendations for Other Services       Precautions / Restrictions Precautions Precautions: Fall Restrictions Weight Bearing Restrictions: No    Mobility  Bed Mobility               General bed mobility comments: patient received in bathroom with NT present, returned to recliner  Transfers Overall transfer level: Needs assistance   Transfers: Sit to/from Stand Sit to Stand: Min guard         General transfer comment: did not want to use walker  Ambulation/Gait Ambulation/Gait assistance: Min guard Gait Distance (Feet): 200 Feet Assistive device: None Gait Pattern/deviations: Step-through pattern;Drifts right/left Gait velocity: decr   General Gait Details: patient ambulated some without UE support, or 1 UE support on rail in hallway. Improved balance   Stairs             Wheelchair Mobility    Modified Rankin (Stroke Patients Only)       Balance Overall balance assessment: Needs assistance Sitting-balance support:  Feet supported Sitting balance-Leahy Scale: Good     Standing balance support: Single extremity supported;During functional activity Standing balance-Leahy Scale: Good Standing balance comment: benefits from single UE support with ambulation. Supervision for safety                            Cognition Arousal/Alertness: Awake/alert Behavior During Therapy: WFL for tasks assessed/performed Overall Cognitive Status: Within Functional Limits for tasks assessed                                 General Comments: A&Ox4, follows all commands      Exercises      General Comments        Pertinent Vitals/Pain Pain Assessment: No/denies pain    Home Living                      Prior Function            PT Goals (current goals can now be found in the care plan section) Acute Rehab PT Goals Patient Stated Goal: patient open to SNF for short term rehab to get stronger to then go home PT Goal Formulation: With patient/family Time For Goal Achievement: 07/21/19 Potential to Achieve Goals: Good Progress towards PT goals: Progressing toward goals    Frequency    Min 2X/week      PT Plan Current plan remains appropriate  Co-evaluation              AM-PAC PT "6 Clicks" Mobility   Outcome Measure  Help needed turning from your back to your side while in a flat bed without using bedrails?: A Little Help needed moving from lying on your back to sitting on the side of a flat bed without using bedrails?: A Little Help needed moving to and from a bed to a chair (including a wheelchair)?: A Little Help needed standing up from a chair using your arms (e.g., wheelchair or bedside chair)?: A Little Help needed to walk in hospital room?: A Little Help needed climbing 3-5 steps with a railing? : A Lot 6 Click Score: 17    End of Session Equipment Utilized During Treatment: Gait belt;Oxygen Activity Tolerance: Patient tolerated treatment  well Patient left: in chair;with chair alarm set;with family/visitor present Nurse Communication: Mobility status PT Visit Diagnosis: Difficulty in walking, not elsewhere classified (R26.2);Unsteadiness on feet (R26.81);Muscle weakness (generalized) (M62.81)     Time: 1510-1530 PT Time Calculation (min) (ACUTE ONLY): 20 min  Charges:  $Gait Training: 8-22 mins                     Dereke Neumann, PT, GCS 07/08/19,3:48 PM
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
## 2019-07-08 NOTE — Plan of Care (Signed)
  Problem: Skin Integrity: Goal: Risk for impaired skin integrity will decrease Outcome: Progressing   Problem: Education: Goal: Knowledge of General Education information will improve Description: Including pain rating scale, medication(s)/side effects and non-pharmacologic comfort measures Outcome: Progressing   Problem: Health Behavior/Discharge Planning: Goal: Ability to manage health-related needs will improve Outcome: Progressing

## 2019-07-08 NOTE — Progress Notes (Signed)
Gail Nelson   DOB:Apr 26, 1923   AQ#:[id number]    Subjective: Patient resting in the chair comfortably.  Notes have improvement of difficulty breathing status post thoracentesis yesterday.  Leg swelling improving.  Objective:  Vitals:   07/08/19 1541 07/08/19 1621  BP:  138/70  Pulse:  87  Resp:  17  Temp:  97.9 F (36.6 C)  SpO2: (!) 88% 99%     Intake/Output Summary (Last 24 hours) at 07/08/2019 2130 Last data filed at 07/08/2019 1847 Gross per 24 hour  Intake 120 ml  Output --  Net 120 ml    Physical Exam  Constitutional: She is oriented to person, place, and time and well-developed, well-nourished, and in no distress.  Elderly Caucasian female patient.  Patient is resting in the chair comfortably.  She is not on oxygen.  HENT:  Head: Normocephalic and atraumatic.  Mouth/Throat: Oropharynx is clear and moist. No oropharyngeal exudate.  Eyes: Pupils are equal, round, and reactive to light.  Cardiovascular: Normal rate and regular rhythm.  Pulmonary/Chest: No respiratory distress. She has no wheezes.  Decreased air entry right more than left.  Abdominal: Soft. Bowel sounds are normal. She exhibits no distension and no mass. There is no abdominal tenderness. There is no rebound and no guarding.  Musculoskeletal:        General: No tenderness or edema. Normal range of motion.     Cervical back: Normal range of motion and neck supple.     Comments: Ecchymosis/bruising of the right lower extremity.  Swelling noted of the left lower extremity. (Improving)  Neurological: She is alert and oriented to person, place, and time.  Skin: Skin is warm.  Psychiatric: Affect normal.    Labs:  Lab Results  Component Value Date   WBC 5.7 07/08/2019   HGB 10.6 (L) 07/08/2019   HCT 32.6 (L) 07/08/2019   MCV 91.1 07/08/2019   PLT 168 07/08/2019   NEUTROABS 4.5 07/08/2019    Lab Results  Component Value Date   NA 138 07/08/2019   K 3.9 07/08/2019   CL 97 (L) 07/08/2019   CO2 31  07/08/2019    Studies:  DG Chest 1 View  Result Date: 07/08/2019 CLINICAL DATA:  86 year old female with shortness of breath. Malignant recurrent right pleural effusion. EXAM: CHEST  1 VIEW COMPARISON:  Portable chest 07/07/2019. FINDINGS: Portable AP upright view at 0505 hours. Stable lung volumes and mediastinal contours. Calcified aortic atherosclerosis again noted. Visualized tracheal air column is within normal limits. No pneumothorax. Small residual right pleural effusion suspected. Patchy right lung base opacity not significantly changed. No areas of worsening ventilation. Stable visualized osseous structures. IMPRESSION: 1. Stable suspected small residual right pleural effusion. 2. No new acute cardiopulmonary abnormality. Electronically Signed   By: Genevie Ann M.D.   On: 07/08/2019 08:08   DG Chest 1 View  Result Date: 07/07/2019 CLINICAL DATA:  Malignant recurrent right pleural effusion and status post thoracentesis. EXAM: CHEST  1 VIEW COMPARISON:  CTA of the chest on 07/04/2019 FINDINGS: Stable heart size and tortuosity of the thoracic aorta. No significant residual right pleural fluid after thoracentesis. No pneumothorax. Basilar opacity on the right likely related to known lung mass. No pulmonary edema. IMPRESSION: No significant residual right pleural fluid after thoracentesis. No pneumothorax. Electronically Signed   By: Aletta Edouard M.D.   On: 07/07/2019 16:21   US THORACENTESIS ASP PLEURAL SPACE W/IMG GUIDE  Result Date: 07/07/2019 CLINICAL DATA:  Malignant right pleural effusion. EXAM: ULTRASOUND GUIDED  RIGHT THORACENTESIS COMPARISON:  CTA of the chest on 07/04/2019 PROCEDURE: An ultrasound guided thoracentesis was thoroughly discussed with the patient and questions answered. The benefits, risks, alternatives and complications were also discussed. The patient understands and wishes to proceed with the procedure. Written consent was obtained. Ultrasound was performed to localize and mark  an adequate pocket of fluid in the right chest. The area was then prepped and draped in the normal sterile fashion. 1% Lidocaine was used for local anesthesia. Under ultrasound guidance a 6 French Safe-T-Centesis catheter was introduced. Thoracentesis was performed. The catheter was removed and a dressing applied. COMPLICATIONS: None FINDINGS: A total of approximately 1.2 L of bloody fluid was removed. IMPRESSION: Successful ultrasound guided right thoracentesis yielding 1.2 L of pleural fluid. Electronically Signed   By: Aletta Edouard M.D.   On: 07/07/2019 16:16    Primary malignant neoplasm of right lower lobe of lung ([place]) 86 year old female patient with a history of stage IV lung cancer-currently admitted to hospital for left lower extremity acute DVT  #Left lower extremity acute DVT-secondary to underlying malignancy-s/p thrombectomy; currently on Eliquis.  Stable.  #Right large malignant pleural effusion- s/p thoracentesis drained 1.2 L of bloody fluid.  Symptomatically improved.  Recommend outpatient Pleurx catheter placement.   # Metastatic/stage IV lung cancer-patient is a poor candidate for palliative systemic therapy.  Patient is high risk of complications from systemic therapy.  I would recommend best supportive care moving forward.  Discussed with the patient and daughter-they are also in agreement that the risk of treatment outweigh the benefits.  Interested in maintaining/quality of life.  Discussed with [employer].  Recommend discharge to nursing home when bed available.    Cammie Sickle, MD 07/08/2019  9:30 PM

## 2019-07-09 LAB — MAGNESIUM: Magnesium: 1.7 mg/dL (ref 1.7–2.4)

## 2019-07-09 LAB — CBC WITH DIFFERENTIAL/PLATELET
Abs Immature Granulocytes: 0.04 10*3/uL (ref 0.00–0.07)
Basophils Absolute: 0 10*3/uL (ref 0.0–0.1)
Basophils Relative: 0 %
Eosinophils Absolute: 0.2 10*3/uL (ref 0.0–0.5)
Eosinophils Relative: 4 %
HCT: 31.3 % — ABNORMAL LOW (ref 36.0–46.0)
Hemoglobin: 10 g/dL — ABNORMAL LOW (ref 12.0–15.0)
Immature Granulocytes: 1 %
Lymphocytes Relative: 10 %
Lymphs Abs: 0.5 10*3/uL — ABNORMAL LOW (ref 0.7–4.0)
MCH: 28.8 pg (ref 26.0–34.0)
MCHC: 31.9 g/dL (ref 30.0–36.0)
MCV: 90.2 fL (ref 80.0–100.0)
Monocytes Absolute: 0.6 10*3/uL (ref 0.1–1.0)
Monocytes Relative: 12 %
Neutro Abs: 3.8 10*3/uL (ref 1.7–7.7)
Neutrophils Relative %: 73 %
Platelets: 176 10*3/uL (ref 150–400)
RBC: 3.47 MIL/uL — ABNORMAL LOW (ref 3.87–5.11)
RDW: 15.9 % — ABNORMAL HIGH (ref 11.5–15.5)
WBC: 5.2 10*3/uL (ref 4.0–10.5)
nRBC: 0 % (ref 0.0–0.2)

## 2019-07-09 LAB — PHOSPHORUS: Phosphorus: 3.8 mg/dL (ref 2.5–4.6)

## 2019-07-09 LAB — COMPREHENSIVE METABOLIC PANEL
ALT: <5 U/L (ref 0–44)
AST: 14 U/L — ABNORMAL LOW (ref 15–41)
Albumin: 3 g/dL — ABNORMAL LOW (ref 3.5–5.0)
Alkaline Phosphatase: 45 U/L (ref 38–126)
Anion gap: 11 (ref 5–15)
BUN: 12 mg/dL (ref 8–23)
CO2: 31 mmol/L (ref 22–32)
Calcium: 8.9 mg/dL (ref 8.9–10.3)
Chloride: 97 mmol/L — ABNORMAL LOW (ref 98–111)
Creatinine, Ser: 0.75 mg/dL (ref 0.44–1.00)
GFR calc Af Amer: >60 mL/min (ref >60)
GFR calc non Af Amer: >60 mL/min (ref >60)
Glucose, Bld: 100 mg/dL — ABNORMAL HIGH (ref 70–99)
Potassium: 3.7 mmol/L (ref 3.5–5.1)
Sodium: 139 mmol/L (ref 135–145)
Total Bilirubin: 0.8 mg/dL (ref 0.3–1.2)
Total Protein: 5.6 g/dL — ABNORMAL LOW (ref 6.5–8.1)

## 2019-07-09 NOTE — Discharge Summary (Signed)
Physician Discharge Summary  [name] PTW:[id number] DOB: Jun 23, 1923 DOA: 07/04/2019  PCP: Lavera Guise, MD  Admit date: 07/04/2019 Discharge date: 07/09/2019  Admitted From: Home Disposition: SNF  Recommendations for Outpatient Follow-up:  1. Follow up with PCP in 1-2 weeks 2. Follow up with Medical Oncology within 1 week and have Dr. Rogue Nelson arrange for PleurX Catheter 3. Follow up with IR within 1 week for PleurX Catheter 4. Follow-up with pulmonary Dr. Nathanial Rancher if necessary 5. Palliative Care to follow at SNF 6. Follow up with Vascular Surgery within 1-2 weeks  7. Please obtain CMP/CBC, Mag, Phos in one week 8. Please follow up on the following pending results: FLUID CYTOLOGY from Thoracentesis and Fluid Cx Results   Home Health: No  Equipment/Devices: None  Discharge Condition: Stable CODE STATUS: DO NOT RESUSCITATE Diet recommendation: Heart Healthy Diet   Brief/Interim Summary: HPI per Dr. Royce Macadamia Nelson on 07/04/19 Gail Nelson a 86 y.o.femalewith medicalof lung cancersignificant for lung cancer(recently diagnosed during chemotherapy), hypertension, COPD, abdominal aortic aneurysm status post repair and DVT on Eliquis who presents to the ED with complaints of left leg swelling.Patient had an endoleak following AAA repair and had a hematoma involving her right lower extremity but that seems to have improved. She was started on Eliquis for her DVT and follow-up ultrasound shows progression of DVT now showing complete occlusion of the common femoral vein. Patient complains of chest discomfort which she describes as a pleuritic pain that occurs when she coughs. Patient had a CT angiogram which showsno evidence acute pulmonary embolism. Chronic large RIGHT pleural effusion with passive atelectasis of the RIGHT lung. Left leg venous Doppler showsin comparison with recent study, there is now essentially complete occlusion of the left common femoral  vein and left saphenofemoral junction regions. Thrombus is no longer appreciable in the left profunda femoral vein. There has been likely propagation of acute deep venous thrombosis more proximally into the left common femoral junction region. Note that this circumstance places patient at significant increased risk for pulmonary embolus development. She denies having any fever or chills, no cough, no dizziness or lightheadedness, no urinary symptoms or changes in her bowel habits.  ED Course:86 year old female with a history of lung cancer and recently diagnosed DVT who presents to the emergency room for worsening lower extremity swelling.Patient had a left leg venous Doppler which showed In comparison with recent study, there is now essentially complete occlusion of the left common femoral vein and left saphenofemoral junction regions. Thrombus is no longer appreciable in the left profunda femoral vein. There has been likely propagation of acute deep venous thrombosis more proximally into the left common femoral junction region. Note that this circumstance places patient at significant increased risk for pulmonary embolus development. Visualized proximal inferior vena cava appears patent without evident thrombus.  **Interim History Because of her persistent symptoms she underwent a mechanical thrombectomy with placement of an IVC filter.  Medical oncology was consulted for further anticoagulation recommendations and at their recommendation palliative care was also consulted for goals of care discussion and they recommend probable eventual hospice involvement at home and currently want to see if the patient can improve her functional status and PT OT now recommending SNF and she is stable to be discharged to SNF.  Medical oncology also recommending Pleurx catheter placement however this cannot be done so and instead will obtain a thoracentesis today and arrange for the proximal catheter to be  done in outpatient setting.  I spoke with  Dr. Rogue Nelson and he does not feel that this is a true failure of Eliquis and recommends changing to Eliquis after thoracentesis.  She was transitioned from heparin to Eliquis by vascular team and regular remove her compression dressing.  She remains stable from a medical standpoint to be discharged and will need to follow-up with PCP, medical oncology, palliative care as well as vascular surgeon outpatient setting.  **ADDENDUM 07/09/19: Remains medically stable for discharge, however still waiting for insurance authorization.  She had no acute events overnight and family was updated and appreciated of the care.  Her DNR and pain prescription have been signed for discharge.  Discharge Diagnoses:  Principal Problem:   DVT (deep venous thrombosis) (HCC) Active Problems:   Chronic obstructive pulmonary disease (HCC)   Benign hypertension   Primary malignant neoplasm of right lower lobe of lung (HCC)   Palliative care encounter  LEFT Leg DVT -Initially thought to have Failed anticoagulant therapy (Eliquis) however after extensive discussion with her primary hematologist and oncologist he does not feel that she truly feels Eliquis and recommends discharging her back on Eliquis;DVT has extended and fully occluded left lower extremity common femoral vein and left saphenofemoral -Evaluated byDr. Margot Ables surgery on 5/31. Agreed with starting patient on heparin see note  -Vascular Surgery took the patient for undergoing mechanical thrombectomy with placement of an IVC filteron 6/2 due to continued LLE Pain and swelling; See below. Dr Lucky Cowboy preformed the procedure  -Per Vascular unable to use TPA due to the patient having cancer given increased risk of bleeding -Vascular Surgery recommending continuing Heparin and transitioning back to Eliquis post procedure but will discuss with Oncology about Anticoagulation recc's and they feel it is appropriate to  resume Eliquis -C/w Heparin gtt for now and Dr. Rogue Nelson he initially felt that she will need Lovenox Injections but feels that family and patient may not be amenable. He feels that a GOC discussion should be had including hospice discussion and likely she will need hospice in outpatient setting so palliative care was consulted. -Dr. Rogue Nelson to see the patient in the a.m. and he is also recommended palliative care consultation which is also been placed -Dr. Rogue Nelson saw the patient and feels that that she did not really feel Eliquis and recommends Eliquis at discharge and she is transition from heparin to Eliquis today by vascular but before she was transitioned she had a thoracentesis done for her large malignant likely pleural effusion. -Thoracentesis done yesterday -Leg Dressing to be removed yesterday -Stable to be resume Eliquis and will need to follow up with Medical Oncology and Vascular Surgery within 1-2 weeks   RIGHT Lower Lobe stage IVAdenocarcinoma lung cancer with pleural effusion Chronic respiratory failure with hypoxia -Per Dr.Govinda Brahmanday Oncologynote from 06/20/2019 patient s/p radiation treatment5/07/2019. Also received Keytruda but was placed on hold secondary to side effects. -Has Chronic Large Right Pleural Effusion with Passive Atelectasis of the Right Lung -Patient was to follow-up in 3 weeks and will discuss with Dr. Rogue Nelson and he will see the patient in the a.m. -Medical oncology recommending Pleurx catheter to be placed however this cannot be done in the inpatient setting so they will be arranging to be done outpatient setting but instead she underwent thoracentesis yesterday and had 1.2 L of fluid removed -Repeat CXR this AM showed "Stable suspected small residual right pleural effusion. No new acute cardiopulmonary abnormality." -Will need PluerX in the outpatient setting and Dr. Rogue Nelson to arrange with Dr. Kathlene Cote -Continue with supportive care  and  continue supplemental oxygen via nasal cannula  Hypertension -C/w Coreg 3.125 mgBID -C/w Losartan 50 mg daily -Remained stable and was 141/70  CKD Stage 3a -Patient's BUN/Cr has remained relatively stable and is 12/0.75 -Continue to monitor and trend -Stopped IV fluid hydration -Avoid Nephrotoxic Medications, Contrast Dyes, Hypotension and Renally adjust Medications -Repeat CMP within 1 week  AAA s/p Repair -Follow-up in the outpatient setting  COPD -Continue as needed bronchodilator therapy with albuterol 2.5 mg nebs every 6 hours as needed for wheezing as well as mometasone-formoterol 2 puffs elation twice daily while hospitalized and resume home Inhalers at D/C -Wearing 2 Liters of Supplemental O2; Wears it at night usually   Normocytic Anemia -Patient's Hgb/Hct went from 11.9/36.9 -> 11.3/36.0 -> 10.6/32.6 -> 10.0/31.3 -Check Anemia Panel and showed an iron level of 39, U IBC of 252, TIBC of 291, saturation ratios of 13%, ferritin level of 110, folate level 5.9, vitamin B12 of 3183 -Continue to Monitor for S/Sx of Bleeding as she is Anticoagulated with Heparin gtt; Currently no overt bleeding noted -Repeat CBC in AM  Obesity -Estimated body mass index is 36.25 kg/m as calculated from the following:   Height as of this encounter: 5\' 2"  (1.575 m).   Weight as of this encounter: 89.9 kg. -Nutritionist consulted and recommending Ensure Enlive po BID  GOC: DNR, poA -Palliative care consulted for further evaluation recommendations given medical oncology recommendations -Likely will need hospice discussion at some point given Oncology's concerned that she is not going to do well long-term -Palliative care consulted and they spoke with the patient and the patient's daughter and the patient daughter does not feel the patient can return home at the time of discharge and recommends going to rehab for PT OT but ultimately they would be in agreement with the patient going home  with hospice supportive care eventually; per palliative patient's daughter is frustrated by patient's recent medical care and patient's daughter only wants to primarily focus on improving the patient's performance status to ensure that she has the highest quality of life going forward; She is stable to D/C to SNF and will be discharged once bed is available and Ship broker received (Still pending)   Discharge Instructions  Discharge Instructions    Call MD for:  difficulty breathing, headache or visual disturbances   Complete by: As directed    Call MD for:  extreme fatigue   Complete by: As directed    Call MD for:  hives   Complete by: As directed    Call MD for:  persistant dizziness or light-headedness   Complete by: As directed    Call MD for:  persistant nausea and vomiting   Complete by: As directed    Call MD for:  redness, tenderness, or signs of infection (pain, swelling, redness, odor or green/yellow discharge around incision site)   Complete by: As directed    Call MD for:  severe uncontrolled pain   Complete by: As directed    Call MD for:  temperature >100.4   Complete by: As directed    Diet - low sodium heart healthy   Complete by: As directed    Discharge instructions   Complete by: As directed    You were cared for by a hospitalist during your hospital stay. If you have any questions about your discharge medications or the care you received while you were in the hospital after you are discharged, you can call the unit and ask to speak  with the hospitalist on call if the hospitalist that took care of you is not available. Once you are discharged, your primary care physician will handle any further medical issues. Please note that NO REFILLS for any discharge medications will be authorized once you are discharged, as it is imperative that you return to your primary care physician (or establish a relationship with a primary care physician if you do not have one)  for your aftercare needs so that they can reassess your need for medications and monitor your lab values.  Follow up with PCP, Medical Oncology, Palliative Care, and Vascular Surgery. Take all medications as prescribed. If symptoms change or worsen please return to the ED for evaluation   Increase activity slowly   Complete by: As directed      Allergies as of 07/09/2019      Reactions   Augmentin [amoxicillin-pot Clavulanate]    Reaction unknown   Clarithromycin Other (See Comments)   "Tongue gets cover with fuzz"   Other    bioxin causes tongue to "grow fur"   Oxycodone Other (See Comments)   Penicillins Swelling   Tramadol Other (See Comments)      Medication List    STOP taking these medications   chlorpheniramine-HYDROcodone 10-8 MG/5ML Suer Commonly known as: Tussionex Pennkinetic ER   clotrimazole-betamethasone cream Commonly known as: LOTRISONE     TAKE these medications   acetaminophen 325 MG tablet Commonly known as: TYLENOL Take 2 tablets (650 mg total) by mouth every 6 (six) hours as needed for mild pain (or Fever >/= 101).   albuterol (2.5 MG/3ML) 0.083% nebulizer solution Commonly known as: PROVENTIL Take 3 mLs (2.5 mg total) by nebulization every 6 (six) hours as needed for wheezing or shortness of breath.   Apixaban Starter Pack (10mg  and 5mg ) Commonly known as: ELIQUIS STARTER PACK Take as directed on package: start with two-5mg  tablets twice daily for 7 days. On day 8, switch to one-5mg  tablet twice daily.   carvedilol 3.125 MG tablet Commonly known as: COREG Take 1 tablet (3.125 mg total) by mouth 2 (two) times daily.   cholecalciferol 1000 units tablet Commonly known as: VITAMIN D Take 1,000 Units by mouth daily.   feeding supplement (ENSURE ENLIVE) Liqd Take 237 mLs by mouth 2 (two) times daily between meals.   fluticasone 50 MCG/ACT nasal spray Commonly known as: FLONASE Place 2 sprays into both nostrils daily.   Fluticasone-Salmeterol  250-50 MCG/DOSE Aepb Commonly known as: Advair Diskus Inhale 1 puff into the lungs 2 (two) times daily.   HYDROcodone-acetaminophen 5-325 MG tablet Commonly known as: Norco Take 1 tablet by mouth every 6 (six) hours as needed for moderate pain.   KRILL OIL PO Take by mouth.   losartan 50 MG tablet Commonly known as: COZAAR TAKE 1 TABLET(50 MG) BY MOUTH DAILY   Manganese 10 MG Tabs Take 1 tablet by mouth daily.   ondansetron 4 MG tablet Commonly known as: ZOFRAN Take 1 tablet (4 mg total) by mouth every 6 (six) hours as needed for nausea.   OXYGEN Inhale into the lungs. 2 liters at night   potassium gluconate 595 (99 K) MG Tabs tablet Take 595 mg by mouth daily.   vitamin B-12 1000 MCG tablet Commonly known as: CYANOCOBALAMIN Take 1,000 mcg by mouth daily.   vitamin E 180 MG (400 UNITS) capsule Take 400 Units by mouth daily.       Contact information for follow-up providers    Dew, Erskine Squibb, MD  Follow up in 1 week(s).   Specialties: Vascular Surgery, Radiology, Interventional Cardiology Why: Can see Dew or Arna Medici. Will need left lower extremity DVT study with visit.  Contact information: [place] [address] [phone]            Contact information for after-discharge care    [facility] .   Service: Skilled Nursing Contact information: [place] [phone]                 Allergies  Allergen Reactions  . Augmentin [Amoxicillin-Pot Clavulanate]     Reaction unknown  . Clarithromycin Other (See Comments)    "Tongue gets cover with fuzz"  . Other     bioxin causes tongue to "grow fur"  . Oxycodone Other (See Comments)  . Penicillins Swelling  . Tramadol Other (See Comments)   Consultations:  Vascular Surgery Dr. Harold Barban and Dr. Lucky Cowboy  Medical Oncology Dr. Rogue Nelson  Palliative Care Medicine   Procedures/Studies: DG Chest 1  View  Result Date: 07/08/2019 CLINICAL DATA:  86 year old female with shortness of breath. Malignant recurrent right pleural effusion. EXAM: CHEST  1 VIEW COMPARISON:  Portable chest 07/07/2019. FINDINGS: Portable AP upright view at 0505 hours. Stable lung volumes and mediastinal contours. Calcified aortic atherosclerosis again noted. Visualized tracheal air column is within normal limits. No pneumothorax. Small residual right pleural effusion suspected. Patchy right lung base opacity not significantly changed. No areas of worsening ventilation. Stable visualized osseous structures. IMPRESSION: 1. Stable suspected small residual right pleural effusion. 2. No new acute cardiopulmonary abnormality. Electronically Signed   By: Genevie Ann M.D.   On: 07/08/2019 08:08   DG Chest 1 View  Result Date: 07/07/2019 CLINICAL DATA:  Malignant recurrent right pleural effusion and status post thoracentesis. EXAM: CHEST  1 VIEW COMPARISON:  CTA of the chest on 07/04/2019 FINDINGS: Stable heart size and tortuosity of the thoracic aorta. No significant residual right pleural fluid after thoracentesis. No pneumothorax. Basilar opacity on the right likely related to known lung mass. No pulmonary edema. IMPRESSION: No significant residual right pleural fluid after thoracentesis. No pneumothorax. Electronically Signed   By: Aletta Edouard M.D.   On: 07/07/2019 16:21   CT Angio Chest PE W/Cm &/Or Wo Cm  Result Date: 07/04/2019 CLINICAL DATA:  LEFT leg swelling.  Short of breath. EXAM: CT ANGIOGRAPHY CHEST WITH CONTRAST TECHNIQUE: Multidetector CT imaging of the chest was performed using the standard protocol during bolus administration of intravenous contrast. Multiplanar CT image reconstructions and MIPs were obtained to evaluate the vascular anatomy. CONTRAST:  44mL OMNIPAQUE IOHEXOL 350 MG/ML SOLN COMPARISON:  Doppler ultrasound lower extremities 07/04/2019, CT angiogram chest 06/10/2019. FINDINGS: Cardiovascular: No filling  defects within the pulmonary arteries to suggest acute pulmonary embolism. Coronary artery calcification and aortic atherosclerotic calcification. Mediastinum/Nodes: No axillary supraclavicular adenopathy. No mediastinal hilar adenopathy no pericardial effusion. Esophagus normal Lungs/Pleura: Large layering RIGHT pleural effusion similar to comparison CT. There is passive atelectasis of the RIGHT lower lobe. No pulmonary infarction identified. Atelectasis within the RIGHT middle lobe is also similar prior. Upper Abdomen: Limited view of the liver, kidneys, pancreas are unremarkable. Normal adrenal glands. Musculoskeletal: No aggressive osseous lesion. Review of the MIP images confirms the above findings. IMPRESSION: 1. No evidence acute pulmonary embolism. 2. Chronic large RIGHT pleural effusion with passive atelectasis of the RIGHT lung. Electronically Signed   By: Suzy Bouchard M.D.   On: 07/04/2019 14:35  CT ANGIO AO+BIFEM W & OR WO CONTRAST  Result Date: 06/10/2019 CLINICAL DATA:  Soft tissue mass at the thigh with spontaneous hemorrhage. Possible right femoral artery injury status post endovascular intervention. EXAM: CT ANGIOGRAPHY OF ABDOMINAL AORTA WITH ILIOFEMORAL RUNOFF TECHNIQUE: Multidetector CT imaging of the abdomen, pelvis and lower extremities was performed using the standard protocol during bolus administration of intravenous contrast. Multiplanar CT image reconstructions and MIPs were obtained to evaluate the vascular anatomy. CONTRAST:  161mL OMNIPAQUE IOHEXOL 350 MG/ML SOLN COMPARISON:  None. CT dated 06/01/2019.  PET-CT dated Jun 09, 2019 FINDINGS: VASCULAR Aorta: There is a large abdominal aortic aneurysm measuring approximately 6.6 x 6.6 cm. The patient is status post EVAR. There appears to be a persistent type 2 endoleak likely arising from a lumbar artery. Celiac: Patent without evidence of aneurysm, dissection, vasculitis or significant stenosis. SMA: Patent without evidence of  aneurysm, dissection, vasculitis or significant stenosis. Renals: Both renal arteries are patent without evidence of aneurysm, dissection, vasculitis, fibromuscular dysplasia or significant stenosis. IMA: The IMA is not appreciated and may be occluded. RIGHT Lower Extremity Inflow: Common, internal and external iliac arteries are patent without evidence of aneurysm, dissection, vasculitis or significant stenosis. Outflow: Common, superficial and profunda femoral arteries and the popliteal artery are patent without evidence of aneurysm, dissection, vasculitis or significant stenosis. Runoff: The proximal anterior tibial, posterior tibial, and peroneal arteries appear to be patent, however there is suboptimal opacification above the knee which may be in part due to contrast bolus timing. LEFT Lower Extremity Inflow: Common, internal and external iliac arteries are patent without evidence of aneurysm, dissection, vasculitis or significant stenosis. Outflow: Common, superficial and profunda femoral arteries and the popliteal artery are patent without evidence of aneurysm, dissection, vasculitis or significant stenosis. There is a small amount of hemorrhage about the left common femoral artery. Runoff: The anterior tibial artery, posterior tibial artery, and peroneal arteries appear to be patent proximally. However, the distal aspects are suboptimally evaluated which is felt to be secondary to suboptimal contrast bolus timing. Veins: No obvious venous abnormality within the limitations of this arterial phase study. Review of the MIP images confirms the above findings. NON-VASCULAR Lower chest: There is a moderate to large, partially visualized right-sided pleural effusion with adjacent compressive atelectasis.There is cardiomegaly. Vascular calcifications are noted of the partially visualized thoracic aorta. Hepatobiliary: The liver is normal. Status post cholecystectomy.There is no biliary ductal dilation. Pancreas:  Normal contours without ductal dilatation. No peripancreatic fluid collection. Spleen: Unremarkable. Adrenals/Urinary Tract: --Adrenal glands: Unremarkable. --Right kidney/ureter: No hydronephrosis or radiopaque kidney stones. --Left kidney/ureter: There is a large exophytic cyst arising from the lower pole of the left kidney measuring approximately 5 cm. --Urinary bladder: A bladder diverticulum is noted. Stomach/Bowel: --Stomach/Duodenum: No hiatal hernia or other gastric abnormality. Normal duodenal course and caliber. --Small bowel: Unremarkable. --Colon: Rectosigmoid diverticulosis without acute inflammation. --Appendix: Normal. Lymphatic: --No retroperitoneal lymphadenopathy. --No mesenteric lymphadenopathy. --No pelvic or inguinal lymphadenopathy. Reproductive: Status post hysterectomy. No adnexal mass. Other: No ascites or free air. The abdominal wall is normal. Musculoskeletal. There is a large hematoma involving the proximal right thigh measuring approximately 9.7 x 3.7 by 10 point 8 cm. There is no evidence for active extravasation at this time. There is significant surrounding soft tissue edema. There are metallic clips within the bilateral inguinal regions. There is hyperdensity within the perisacral musculature. This is favored to represent contrast extravasation from the patient's recent angiogram. There is a moderate size right knee joint effusion. Degenerative  changes are noted of the right knee. Metallic coils are now noted at the level of the L5 vertebral body and sacrum. These are likely related to the recent endovascular intervention. IMPRESSION: 1. Large proximal right thigh hematoma without evidence for active extravasation. There are small metallic clips are noted within the soft tissues of the bilateral inguinal region likely representing failure of the Starclose closure. 2. The patient is status post EVAR. The aneurysmal sac demonstrates slight interval increase in size since the prior  study dated 06/01/2019, now measuring 6.6 x 6.6 cm. There is a persistent type 2 endoleak that is favored to be arising from a lumbar artery. 3. Large, partially visualized right-sided pleural effusion with adjacent atelectasis. 4. Unremarkable runoff to the level of the mid calf bilaterally. There is suboptimal opacification of the tibial vasculature distally which is felt to be secondary to suboptimal contrast bolus timing. Correlation with the patient's pedal pulses is recommended. 5. Diverticulosis without CT evidence for diverticulitis. Aortic Atherosclerosis (ICD10-I70.0). These results will be called to the ordering clinician or representative by the Radiologist Assistant, and communication documented in the PACS or [employer]. Electronically Signed   By: Constance Holster M.D.   On: 06/10/2019 19:38   PERIPHERAL VASCULAR CATHETERIZATION  Result Date: 07/06/2019 See op note  PERIPHERAL VASCULAR CATHETERIZATION  Result Date: 06/10/2019 See op note  US Venous Img Lower Unilateral Left  Result Date: 07/04/2019 CLINICAL DATA:  Soft tissue swelling EXAM: LEFT LOWER EXTREMITY VENOUS DUPLEX ULTRASOUND TECHNIQUE: Gray-scale sonography with graded compression, as well as color Doppler and duplex ultrasound were performed to evaluate the left lower extremity deep venous system from the level of the common femoral vein and including the common femoral, femoral, profunda femoral, popliteal and calf veins including the posterior tibial, peroneal and gastrocnemius veins when visible. The superficial great saphenous vein was also interrogated. Spectral Doppler was utilized to evaluate flow at rest and with distal augmentation maneuvers in the common femoral, femoral and popliteal veins. COMPARISON:  Jul 01, 2019 FINDINGS: Contralateral Common Femoral Vein: There is acute appearing thrombus in the left common femoral vein with loss of compressibility and Doppler signal. Common Femoral Vein: There is absence  of flow at the left saphenofemoral junction with loss of apparent flow and compressibility. Saphenofemoral Junction: No evidence of thrombus. Normal compressibility and flow on color Doppler imaging. Profunda Femoral Vein: No evidence of thrombus. Normal compressibility and flow on color Doppler imaging. Femoral Vein: No evidence of thrombus. Normal compressibility, respiratory phasicity and response to augmentation. Popliteal Vein: No evidence of thrombus. Normal compressibility, respiratory phasicity and response to augmentation. Calf Veins: No thrombus evident in the posterior tibial vein. There is incomplete obstruction of the left peroneal vein with diminished flow and diminished compressibility. Superficial Great Saphenous Vein: No evidence of thrombus. Normal compressibility. Venous Reflux:  None. Other Findings:  Patency of the proximal inferior vena cava noted. IMPRESSION: In comparison with recent study, there is now essentially complete occlusion of the left common femoral vein and left saphenofemoral junction regions. Thrombus is no longer appreciable in the left profunda femoral vein. There has been likely propagation of acute deep venous thrombosis more proximally into the left common femoral junction region. Note that this circumstance places patient at significant increased risk for pulmonary embolus development. Visualized proximal inferior vena cava appears patent without evident thrombus. Electronically Signed   By: Lowella Grip III M.D.   On: 07/04/2019 12:08   US Venous Img Lower Unilateral Left  Result Date:  07/01/2019 CLINICAL DATA:  Left leg pain and swelling. EXAM: LEFT LOWER EXTREMITY VENOUS DOPPLER ULTRASOUND TECHNIQUE: Gray-scale sonography with graded compression, as well as color Doppler and duplex ultrasound were performed to evaluate the lower extremity deep venous systems from the level of the common femoral vein and including the common femoral, femoral, profunda femoral,  popliteal and calf veins including the posterior tibial, peroneal and gastrocnemius veins when visible. The superficial great saphenous vein was also interrogated. Spectral Doppler was utilized to evaluate flow at rest and with distal augmentation maneuvers in the common femoral, femoral and popliteal veins. COMPARISON:  07/02/2004 FINDINGS: Contralateral Common Femoral Vein: Respiratory phasicity is normal and symmetric with the symptomatic side. No evidence of thrombus. Normal compressibility. Common Femoral Vein: Nonocclusive thrombus. Saphenofemoral Junction: Nonocclusive thrombus. Profunda Femoral Vein: Nonocclusive thrombus. Femoral Vein: No evidence of thrombus. Normal compressibility, respiratory phasicity and response to augmentation. Popliteal Vein: No evidence of thrombus. Normal compressibility, respiratory phasicity and response to augmentation. Calf Veins: No evidence of thrombus. Normal compressibility and flow on color Doppler imaging. Superficial Great Saphenous Vein: No evidence of thrombus. Normal compressibility. Venous Reflux:  None. Other Findings:  None. IMPRESSION: Nonocclusive deep venous thrombosis involving the left common femoral vein, profunda femoral vein and saphenofemoral venous junction. Electronically Signed   By: Claudie Revering M.D.   On: 07/01/2019 17:54   US THORACENTESIS ASP PLEURAL SPACE W/IMG GUIDE  Result Date: 07/07/2019 CLINICAL DATA:  Malignant right pleural effusion. EXAM: ULTRASOUND GUIDED RIGHT THORACENTESIS COMPARISON:  CTA of the chest on 07/04/2019 PROCEDURE: An ultrasound guided thoracentesis was thoroughly discussed with the patient and questions answered. The benefits, risks, alternatives and complications were also discussed. The patient understands and wishes to proceed with the procedure. Written consent was obtained. Ultrasound was performed to localize and mark an adequate pocket of fluid in the right chest. The area was then prepped and draped in the normal  sterile fashion. 1% Lidocaine was used for local anesthesia. Under ultrasound guidance a 6 French Safe-T-Centesis catheter was introduced. Thoracentesis was performed. The catheter was removed and a dressing applied. COMPLICATIONS: None FINDINGS: A total of approximately 1.2 L of bloody fluid was removed. IMPRESSION: Successful ultrasound guided right thoracentesis yielding 1.2 L of pleural fluid. Electronically Signed   By: Aletta Edouard M.D.   On: 07/07/2019 16:16   LLE VENOUS DUPLEX 07/04/19 IMPRESSION: In comparison with recent study, there is now essentially complete occlusion of the left common femoral vein and left saphenofemoral junction regions. Thrombus is no longer appreciable in the left profunda femoral vein. There has been likely propagation of acute deep venous thrombosis more proximally into the left common femoral junction region. Note that this circumstance places patient at significant increased risk for pulmonary embolus development.  Visualized proximal inferior vena cava appears patent without evident thrombus.  PROCEDURE 07/06/19 by Dr. Lucky Cowboy 1. US guidance for vascular access to left popliteal vein 2. Catheter placement into left common iliac veinfrom left poplitealapproach 3. IVC gram and left lower extremityvenogram 4. Mechanical thrombectomy to proximal left superficial femoral vein, common femoral vein, external and common iliac veins with the penumbra CAT 12 device 5. PTA of left external and common iliac veins with 10 mm diameter angioplasty balloon  Thoracentesis was done 07/07/2019 and drained 1.2 L of bloody fluid from the right pleural space  Subjective: Seen and Examined at bedside and states that she is doing much better today and feels little bit stronger.  States that her respiratory status is doing good  and not complaining of much pain in her leg.  No lightheadedness or dizziness.  Wants to go to rehab before going home and stable for  discharge.  No other concerns or plans at this time.  Discharge Exam: Vitals:   07/09/19 0042 07/09/19 0824  BP: (!) 146/79 (!) 141/70  Pulse: 98 99  Resp: 18 17  Temp: 97.7 F (36.5 C) 97.8 F (36.6 C)  SpO2: 97% 94%   Vitals:   07/08/19 1541 07/08/19 1621 07/09/19 0042 07/09/19 0824  BP:  138/70 (!) 146/79 (!) 141/70  Pulse:  87 98 99  Resp:  17 18 17   Temp:  97.9 F (36.6 C) 97.7 F (36.5 C) 97.8 F (36.6 C)  TempSrc:  Oral Oral Oral  SpO2: (!) 88% 99% 97% 94%  Weight:      Height:       General: Pt is alert, awake, not in acute distress Cardiovascular: RRR, S1/S2 +, no rubs, no gallops Respiratory: Diminished bilaterally with coarse breath sounds worse on the right compared to left, no wheezing, no rhonchi; wearing supplemental oxygen via nasal cannula again Abdominal: Soft, NT, distended secondary body habitus, bowel sounds + Extremities: Trace edema and has some bruising on her right leg and left leg now unwrapped and a little swollen but not as tender, no cyanosis appreciated  The results of significant diagnostics from this hospitalization (including imaging, microbiology, ancillary and laboratory) are listed below for reference.    Microbiology: Recent Results (from the past 240 hour(s))  SARS Coronavirus 2 by RT PCR (hospital order, performed in [facility] hospital lab) Nasopharyngeal Nasopharyngeal Swab     Status: None   Collection Time: 07/04/19  3:54 PM   Specimen: Nasopharyngeal Swab  Result Value Ref Range Status   SARS Coronavirus 2 NEGATIVE NEGATIVE Final    Comment: (NOTE) SARS-CoV-2 target nucleic acids are NOT DETECTED. The SARS-CoV-2 RNA is generally detectable in upper and lower respiratory specimens during the acute phase of infection. The lowest concentration of SARS-CoV-2 viral copies this assay can detect is 250 copies / mL. A negative result does not preclude SARS-CoV-2 infection and should not be used as the sole basis for treatment or  other patient management decisions.  A negative result may occur with improper specimen collection / handling, submission of specimen other than nasopharyngeal swab, presence of viral mutation(s) within the areas targeted by this assay, and inadequate number of viral copies (<250 copies / mL). A negative result must be combined with clinical observations, patient history, and epidemiological information. Fact Sheet for Patients:   StrictlyIdeas.no Fact Sheet for Healthcare Providers: BankingDealers.co.za This test is not yet approved or cleared  by the [place] FDA and has been authorized for detection and/or diagnosis of SARS-CoV-2 by FDA under an Emergency Use Authorization (EUA).  This EUA will remain in effect (meaning this test can be used) for the duration of the COVID-19 declaration under Section 564(b)(1) of the Act, 21 U.S.C. section 360bbb-3(b)(1), unless the authorization is terminated or revoked sooner. Performed at [facility], [address] [place], [place] [address]   Body fluid culture     Status: None (Preliminary result)   Collection Time: 07/07/19  4:00 PM   Specimen: PATH Cytology Peritoneal fluid  Result Value Ref Range Status   Specimen Description   Final    PERITONEAL Performed at [facility], [address] [place], [place] [address]    Special Requests   Final    NONE  Performed at [facility], [place], [place] [address]    Gram Stain   Final    RARE WBC PRESENT,BOTH PMN AND MONONUCLEAR NO ORGANISMS SEEN    Culture   Final    NO GROWTH 2 DAYS Performed at [facility], [place] [address] [place], [place] [address]    Report Status PENDING  Incomplete    Labs: BNP (last 3 results) Recent Labs    07/04/19 1243  BNP 16.5   Basic Metabolic Panel: Recent Labs  Lab 07/05/19 0419 07/06/19 0159 07/07/19 0800 07/08/19 0615 07/09/19 0549   NA 138 138 140 138 139  K 4.2 4.0 4.2 3.9 3.7  CL 96* 97* 99 97* 97*  CO2 31 30 31 31 31   GLUCOSE 108* 117* 106* 106* 100*  BUN 21 21 13 12 12   CREATININE 0.91 1.04* 0.83 0.96 0.75  CALCIUM 9.2 9.1 9.1 9.0 8.9  MG  --  2.1 2.0 1.8 1.7  PHOS  --  3.4 3.5 3.5 3.8   Liver Function Tests: Recent Labs  Lab 07/06/19 0159 07/07/19 0800 07/08/19 0615 07/09/19 0549  AST 17 16 17  14*  ALT 6 6 5  <5  ALKPHOS 53 50 49 45  BILITOT 0.9 0.8 0.8 0.8  PROT 6.3* 6.2* 5.8* 5.6*  ALBUMIN 3.4* 3.3* 3.2* 3.0*   No results for input(s): LIPASE, AMYLASE in the last 168 hours. No results for input(s): AMMONIA in the last 168 hours. CBC: Recent Labs  Lab 07/04/19 1243 07/04/19 1243 07/05/19 0419 07/06/19 0159 07/07/19 0800 07/08/19 0615 07/09/19 0549  WBC 7.0   < > 6.5 5.4 5.2 5.7 5.2  NEUTROABS 5.6  --   --  3.9 3.8 4.5 3.8  HGB 11.9*   < > 11.9* 11.3* 10.6* 10.6* 10.0*  HCT 36.9   < > 37.5 36.0 33.9* 32.6* 31.3*  MCV 90.7   < > 90.1 92.8 92.4 91.1 90.2  PLT 181   < > 203 183 179 168 176   < > = values in this interval not displayed.   Cardiac Enzymes: No results for input(s): CKTOTAL, CKMB, CKMBINDEX, TROPONINI in the last 168 hours. BNP: Invalid input(s): POCBNP CBG: No results for input(s): GLUCAP in the last 168 hours. D-Dimer No results for input(s): DDIMER in the last 72 hours. Hgb A1c No results for input(s): HGBA1C in the last 72 hours. Lipid Profile No results for input(s): CHOL, HDL, LDLCALC, TRIG, CHOLHDL, LDLDIRECT in the last 72 hours. Thyroid function studies No results for input(s): TSH, T4TOTAL, T3FREE, THYROIDAB in the last 72 hours.  Invalid input(s): FREET3 Anemia work up Recent Labs    07/07/19 0800  VITAMINB12 3,183*  FOLATE 5.9*  FERRITIN 110  TIBC 291  IRON 39  RETICCTPCT 2.6   Urinalysis    Component Value Date/Time   APPEARANCEUR Clear 03/14/2019 0000   GLUCOSEU Negative 03/14/2019 0000   BILIRUBINUR Negative 03/14/2019 0000   PROTEINUR  Negative 03/14/2019 0000   UROBILINOGEN 0.2 01/14/2018 1406   NITRITE Negative 03/14/2019 0000   LEUKOCYTESUR Negative 03/14/2019 0000   Sepsis Labs Invalid input(s): PROCALCITONIN,  WBC,  LACTICIDVEN Microbiology Recent Results (from the past 240 hour(s))  SARS Coronavirus 2 by RT PCR (hospital order, performed in [facility] hospital lab) Nasopharyngeal Nasopharyngeal Swab     Status: None   Collection Time: 07/04/19  3:54 PM   Specimen: Nasopharyngeal Swab  Result Value Ref Range Status   SARS Coronavirus 2 NEGATIVE NEGATIVE Final  Comment: (NOTE) SARS-CoV-2 target nucleic acids are NOT DETECTED. The SARS-CoV-2 RNA is generally detectable in upper and lower respiratory specimens during the acute phase of infection. The lowest concentration of SARS-CoV-2 viral copies this assay can detect is 250 copies / mL. A negative result does not preclude SARS-CoV-2 infection and should not be used as the sole basis for treatment or other patient management decisions.  A negative result may occur with improper specimen collection / handling, submission of specimen other than nasopharyngeal swab, presence of viral mutation(s) within the areas targeted by this assay, and inadequate number of viral copies (<250 copies / mL). A negative result must be combined with clinical observations, patient history, and epidemiological information. Fact Sheet for Patients:   StrictlyIdeas.no Fact Sheet for Healthcare Providers: BankingDealers.co.za This test is not yet approved or cleared  by the [place] FDA and has been authorized for detection and/or diagnosis of SARS-CoV-2 by FDA under an Emergency Use Authorization (EUA).  This EUA will remain in effect (meaning this test can be used) for the duration of the COVID-19 declaration under Section 564(b)(1) of the Act, 21 U.S.C. section 360bbb-3(b)(1), unless the authorization is terminated or revoked  sooner. Performed at [facility], [address] [place], [place] [address]   Body fluid culture     Status: None (Preliminary result)   Collection Time: 07/07/19  4:00 PM   Specimen: PATH Cytology Peritoneal fluid  Result Value Ref Range Status   Specimen Description   Final    PERITONEAL Performed at [facility], [address] [place], [place] [address]    Special Requests   Final    NONE Performed at [facility], [address] [place], [place] [address]    Gram Stain   Final    RARE WBC PRESENT,BOTH PMN AND MONONUCLEAR NO ORGANISMS SEEN    Culture   Final    NO GROWTH 2 DAYS Performed at [facility], [place] [address] [place], [place] [address]    Report Status PENDING  Incomplete   Time coordinating discharge: 35 minutes  SIGNED:  Kerney Elbe, DO [facility] 07/09/2019, 11:58 AM Pager is on [place]  If 7PM-7AM, please contact night-coverage [email or web address]

## 2019-07-09 NOTE — Plan of Care (Signed)
  Problem: Clinical Measurements: Goal: Ability to maintain clinical measurements within normal limits will improve Outcome: Progressing Goal: Cardiovascular complication will be avoided Outcome: Progressing   Problem: Activity: Goal: Risk for activity intolerance will decrease Outcome: Progressing   Problem: Nutrition: Goal: Adequate nutrition will be maintained Outcome: Progressing   Problem: Pain Managment: Goal: General experience of comfort will improve Outcome: Progressing   Problem: Safety: Goal: Ability to remain free from injury will improve Outcome: Progressing

## 2019-07-09 NOTE — TOC Progression Note (Signed)
Transition of Care [facility]) - Progression Note    Patient Details  Name: Gail Nelson MRN: [id number] Date of Birth: 15-Apr-1923  Transition of Care [facility]) CM/SW [place], LCSW Phone Number: 07/09/2019, 3:22 PM  Clinical Narrative:    Authorization still pending   Expected Discharge Plan: [place] Barriers to Discharge: Continued Medical Work up  Expected Discharge Plan and Services Expected Discharge Plan: [place]   Discharge Planning Services: CM Consult Post Acute Care Choice: [place] Living arrangements for the past 2 months: Single Family Home Expected Discharge Date: 07/08/19                                     Social Determinants of Health (SDOH) Interventions    Readmission Risk Interventions No flowsheet data found.

## 2019-07-10 LAB — SARS CORONAVIRUS 2 BY RT PCR (HOSPITAL ORDER, PERFORMED IN ~~LOC~~ HOSPITAL LAB): SARS Coronavirus 2: NEGATIVE

## 2019-07-10 LAB — BODY FLUID CULTURE: Culture: NO GROWTH

## 2019-07-10 NOTE — Progress Notes (Signed)
Patient requested a low sodium diet which is what she eats at home.

## 2019-07-10 NOTE — Progress Notes (Signed)
Patient is being discharged to [facility] this afternoon. Report called to [facility]. Patient's belongings packed. IV's removed. AVS printed. EMS here to transport.

## 2019-07-10 NOTE — Plan of Care (Signed)
  Problem: Activity: Goal: Risk for activity intolerance will decrease Outcome: Progressing   Problem: Pain Managment: Goal: General experience of comfort will improve Outcome: Progressing   

## 2019-07-10 NOTE — Discharge Summary (Signed)
Physician Discharge Summary  Gail Nelson QQV:[id number] DOB: 1923/07/08 DOA: 07/04/2019  PCP: Gail Guise, MD  Admit date: 07/04/2019 Discharge date: 07/10/2019  Admitted From: Home Disposition: SNF  Recommendations for Outpatient Follow-up:  1. Follow up with PCP in 1-2 weeks 2. Follow up with Medical Oncology within 1 week and have Gail Nelson arrange for PleurX Catheter 3. Follow up with IR within 1 week for PleurX Catheter 4. Follow-up with pulmonary Gail Nelson if necessary 5. Palliative Care to follow at SNF 6. Follow up with Vascular Surgery within 1-2 weeks  7. Please obtain CMP/CBC, Mag, Phos in one week 8. Please follow up on the following pending results: FLUID CYTOLOGY from Thoracentesis and Fluid Cx Results   Home Health: No  Equipment/Devices: None  Discharge Condition: Stable CODE STATUS: DO NOT RESUSCITATE Diet recommendation: Heart Healthy Diet   Brief/Interim Summary: HPI per Gail Nelson on 07/04/19 Gail Nelson a 86 y.o.femalewith medicalof lung cancersignificant for lung cancer(recently diagnosed during chemotherapy), hypertension, COPD, abdominal aortic aneurysm status post repair and DVT on Eliquis who presents to the ED with complaints of left leg swelling.Patient had an endoleak following AAA repair and had a hematoma involving her right lower extremity but that seems to have improved. She was started on Eliquis for her DVT and follow-up ultrasound shows progression of DVT now showing complete occlusion of the common femoral vein. Patient complains of chest discomfort which she describes as a pleuritic pain that occurs when she coughs. Patient had a CT angiogram which showsno evidence acute pulmonary embolism. Chronic large RIGHT pleural effusion with passive atelectasis of the RIGHT lung. Left leg venous Doppler showsin comparison with recent study, there is now essentially complete occlusion of the left common femoral  vein and left saphenofemoral junction regions. Thrombus is no longer appreciable in the left profunda femoral vein. There has been likely propagation of acute deep venous thrombosis more proximally into the left common femoral junction region. Note that this circumstance places patient at significant increased risk for pulmonary embolus development. She denies having any fever or chills, no cough, no dizziness or lightheadedness, no urinary symptoms or changes in her bowel habits.  ED Course:86 year old female with a history of lung cancer and recently diagnosed DVT who presents to the emergency room for worsening lower extremity swelling.Patient had a left leg venous Doppler which showed In comparison with recent study, there is now essentially complete occlusion of the left common femoral vein and left saphenofemoral junction regions. Thrombus is no longer appreciable in the left profunda femoral vein. There has been likely propagation of acute deep venous thrombosis more proximally into the left common femoral junction region. Note that this circumstance places patient at significant increased risk for pulmonary embolus development. Visualized proximal inferior vena cava appears patent without evident thrombus.  **Interim History Because of her persistent symptoms she underwent a mechanical thrombectomy with placement of an IVC filter.  Medical oncology was consulted for further anticoagulation recommendations and at their recommendation palliative care was also consulted for goals of care discussion and they recommend probable eventual hospice involvement at home and currently want to see if the patient can improve her functional status and PT OT now recommending SNF and she is stable to be discharged to SNF.  Medical oncology also recommending Pleurx catheter placement however this cannot be done so and instead will obtain a thoracentesis today and arrange for the proximal catheter to be  done in outpatient setting.  I spoke with  Gail Nelson and he does not feel that this is a true failure of Eliquis and recommends changing to Eliquis after thoracentesis.  She was transitioned from heparin to Eliquis by vascular team and regular remove her compression dressing.  She remains stable from a medical standpoint to be discharged and will need to follow-up with PCP, medical oncology, palliative care as well as vascular surgeon outpatient setting.  **ADDENDUM 07/09/19: Remains medically stable for discharge, however still waiting for insurance authorization.  She had no acute events overnight and family was updated and appreciated of the care.  Her DNR and pain prescription have been signed for discharge.  **ADDENDUM 07/10/19: Continues to remains medically stable for discharge and now has insurance authorization for discharge.  Repeat Covid testing was ordered prior to discharge yesterday however was never done yesterday still ordered stat today.  Anticipating discharging after that has resulted she has no complaints. Family updated.   Discharge Diagnoses:  Principal Problem:   DVT (deep venous thrombosis) (HCC) Active Problems:   Chronic obstructive pulmonary disease (HCC)   Benign hypertension   Primary malignant neoplasm of right lower lobe of lung (HCC)   Palliative care encounter  LEFT Leg DVT -Initially thought to have Failed anticoagulant therapy (Eliquis) however after extensive discussion with her primary hematologist and oncologist he does not feel that she truly feels Eliquis and recommends discharging her back on Eliquis;DVT has extended and fully occluded left lower extremity common femoral vein and left saphenofemoral -Evaluated byDr. Margot Nelson surgery on 5/31. Agreed with starting patient on heparin see note  -Vascular Surgery took the patient for undergoing mechanical thrombectomy with placement of an IVC filteron 6/2 due to continued LLE Pain and swelling;  See below. Gail Nelson preformed the procedure  -Per Vascular unable to use TPA due to the patient having cancer given increased risk of bleeding -Vascular Surgery recommending continuing Heparin and transitioning back to Eliquis post procedure but will discuss with Oncology about Anticoagulation recc's and they feel it is appropriate to resume Eliquis -C/w Heparin gtt for now and Gail Nelson he initially felt that she will need Lovenox Injections but feels that family and patient may not be amenable. He feels that a GOC discussion should be had including hospice discussion and likely she will need hospice in outpatient setting so palliative care was consulted. -Gail Nelson to see the patient in the a.m. and he is also recommended palliative care consultation which is also been placed -Gail Nelson saw the patient and feels that that she did not really feel Eliquis and recommends Eliquis at discharge and she is transition from heparin to Eliquis today by vascular but before she was transitioned she had a thoracentesis done for her large malignant likely pleural effusion. -Thoracentesis done yesterday -Leg Dressing to be removed yesterday -Stable to be resume Eliquis and will need to follow up with Medical Oncology and Vascular Surgery within 1-2 weeks   RIGHT Lower Lobe stage IVAdenocarcinoma lung cancer with pleural effusion Chronic respiratory failure with hypoxia -Per GailGovinda Brahmanday Oncologynote from 06/20/2019 patient s/p radiation treatment5/07/2019. Also received Keytruda but was placed on hold secondary to side effects. -Has Chronic Large Right Pleural Effusion with Passive Atelectasis of the Right Lung -Patient was to follow-up in 3 weeks and will discuss with Gail Nelson and he will see the patient in the a.m. -Medical oncology recommending Pleurx catheter to be placed however this cannot be done in the inpatient setting so they will be arranging to be done outpatient  setting  but instead she underwent thoracentesis yesterday and had 1.2 L of fluid removed -Repeat CXR this AM showed "Stable suspected small residual right pleural effusion. No new acute cardiopulmonary abnormality." -Will need PluerX in the outpatient setting and Gail Nelson to arrange with Gail. Kathlene Cote -Continue with supportive care and continue supplemental oxygen via nasal cannula  Hypertension -C/w Coreg 3.125 mgBID -C/w Losartan 50 mg daily -Remained stable and was 135/64  CKD Stage 3a -Patient's BUN/Cr has remained relatively stable and  was 12/0.75 yesterday -Continue to monitor and trend -Stopped IV fluid hydration -Avoid Nephrotoxic Medications, Contrast Dyes, Hypotension and Renally adjust Medications -Repeat CMP within 1 week  AAA s/p Repair -Follow-up in the outpatient setting  COPD -Continue as needed bronchodilator therapy with albuterol 2.5 mg nebs every 6 hours as needed for wheezing as well as mometasone-formoterol 2 puffs elation twice daily while hospitalized and resume home Inhalers at D/C -Wearing 2 Liters of Supplemental O2; Wears it at night usually   Normocytic Anemia -Patient's Hgb/Hct went from 11.9/36.9 -> 11.3/36.0 -> 10.6/32.6 -> 10.0/31.3 yesterday -Check Anemia Panel and showed an iron level of 39, U IBC of 252, TIBC of 291, saturation ratios of 13%, ferritin level of 110, folate level 5.9, vitamin B12 of 3183 -Continue to Monitor for S/Sx of Bleeding as she is Anticoagulated with Heparin gtt; Currently no overt bleeding noted -Repeat CBC in AM  Obesity -Estimated body mass index is 36.25 kg/m as calculated from the following:   Height as of this encounter: 5\' 2"  (1.575 m).   Weight as of this encounter: 89.9 kg. -Nutritionist consulted and recommending Ensure Enlive po BID  GOC: DNR, poA -Palliative care consulted for further evaluation recommendations given medical oncology recommendations -Likely will need hospice discussion at some point  given Oncology's concerned that she is not going to do well long-term -Palliative care consulted and they spoke with the patient and the patient's daughter and the patient daughter does not feel the patient can return home at the time of discharge and recommends going to rehab for PT OT but ultimately they would be in agreement with the patient going home with hospice supportive care eventually; per palliative patient's daughter is frustrated by patient's recent medical care and patient's daughter only wants to primarily focus on improving the patient's performance status to ensure that she has the highest quality of life going forward; She is stable to D/C to SNF and will be discharged once bed is available and Ship broker received (Still pending)   Discharge Instructions  Discharge Instructions    Call MD for:  difficulty breathing, headache or visual disturbances   Complete by: As directed    Call MD for:  extreme fatigue   Complete by: As directed    Call MD for:  hives   Complete by: As directed    Call MD for:  persistant dizziness or light-headedness   Complete by: As directed    Call MD for:  persistant nausea and vomiting   Complete by: As directed    Call MD for:  redness, tenderness, or signs of infection (pain, swelling, redness, odor or green/yellow discharge around incision site)   Complete by: As directed    Call MD for:  severe uncontrolled pain   Complete by: As directed    Call MD for:  temperature >100.4   Complete by: As directed    Diet - low sodium heart healthy   Complete by: As directed    Discharge instructions  Complete by: As directed    You were cared for by a hospitalist during your hospital stay. If you have any questions about your discharge medications or the care you received while you were in the hospital after you are discharged, you can call the unit and ask to speak with the hospitalist on call if the hospitalist that took care of you is not  available. Once you are discharged, your primary care physician will handle any further medical issues. Please note that NO REFILLS for any discharge medications will be authorized once you are discharged, as it is imperative that you return to your primary care physician (or establish a relationship with a primary care physician if you do not have one) for your aftercare needs so that they can reassess your need for medications and monitor your lab values.  Follow up with PCP, Medical Oncology, Palliative Care, and Vascular Surgery. Take all medications as prescribed. If symptoms change or worsen please return to the ED for evaluation   Increase activity slowly   Complete by: As directed      Allergies as of 07/10/2019      Reactions   Augmentin [amoxicillin-pot Clavulanate]    Reaction unknown   Clarithromycin Other (See Comments)   "Tongue gets cover with fuzz"   Other    bioxin causes tongue to "grow fur"   Oxycodone Other (See Comments)   Penicillins Swelling   Tramadol Other (See Comments)      Medication List    STOP taking these medications   chlorpheniramine-HYDROcodone 10-8 MG/5ML Suer Commonly known as: Tussionex Pennkinetic ER   clotrimazole-betamethasone cream Commonly known as: LOTRISONE     TAKE these medications   acetaminophen 325 MG tablet Commonly known as: TYLENOL Take 2 tablets (650 mg total) by mouth every 6 (six) hours as needed for mild pain (or Fever >/= 101).   albuterol (2.5 MG/3ML) 0.083% nebulizer solution Commonly known as: PROVENTIL Take 3 mLs (2.5 mg total) by nebulization every 6 (six) hours as needed for wheezing or shortness of breath.   Apixaban Starter Pack (10mg  and 5mg ) Commonly known as: ELIQUIS STARTER PACK Take as directed on package: start with two-5mg  tablets twice daily for 7 days. On day 8, switch to one-5mg  tablet twice daily.   carvedilol 3.125 MG tablet Commonly known as: COREG Take 1 tablet (3.125 mg total) by mouth 2 (two)  times daily.   cholecalciferol 1000 units tablet Commonly known as: VITAMIN D Take 1,000 Units by mouth daily.   feeding supplement (ENSURE ENLIVE) Liqd Take 237 mLs by mouth 2 (two) times daily between meals.   fluticasone 50 MCG/ACT nasal spray Commonly known as: FLONASE Place 2 sprays into both nostrils daily.   Fluticasone-Salmeterol 250-50 MCG/DOSE Aepb Commonly known as: Advair Diskus Inhale 1 puff into the lungs 2 (two) times daily.   HYDROcodone-acetaminophen 5-325 MG tablet Commonly known as: Norco Take 1 tablet by mouth every 6 (six) hours as needed for moderate pain.   KRILL OIL PO Take by mouth.   losartan 50 MG tablet Commonly known as: COZAAR TAKE 1 TABLET(50 MG) BY MOUTH DAILY   Manganese 10 MG Tabs Take 1 tablet by mouth daily.   ondansetron 4 MG tablet Commonly known as: ZOFRAN Take 1 tablet (4 mg total) by mouth every 6 (six) hours as needed for nausea.   OXYGEN Inhale into the lungs. 2 liters at night   potassium gluconate 595 (99 K) MG Tabs tablet Take 595 mg by mouth  daily.   vitamin B-12 1000 MCG tablet Commonly known as: CYANOCOBALAMIN Take 1,000 mcg by mouth daily.   vitamin E 180 MG (400 UNITS) capsule Take 400 Units by mouth daily.       Contact information for follow-up providers    Dew, Erskine Squibb, MD Follow up in 1 week(s).   Specialties: Vascular Surgery, Radiology, Interventional Cardiology Why: Can see Dew or Arna Medici. Will need left lower extremity DVT study with visit.  Contact information: [place] [address] [phone]            Contact information for after-discharge care    [facility] .   Service: Skilled Nursing Contact information: [address] [place] [phone]                 Allergies  Allergen Reactions  . Augmentin [Amoxicillin-Pot Clavulanate]     Reaction unknown  . Clarithromycin Other (See  Comments)    "Tongue gets cover with fuzz"  . Other     bioxin causes tongue to "grow fur"  . Oxycodone Other (See Comments)  . Penicillins Swelling  . Tramadol Other (See Comments)   Consultations:  Vascular Surgery Gail. Harold Barban and Gail. Lucky Nelson  Medical Oncology Gail Nelson  Palliative Care Medicine   Procedures/Studies: DG Chest 1 View  Result Date: 07/08/2019 CLINICAL DATA:  86 year old female with shortness of breath. Malignant recurrent right pleural effusion. EXAM: CHEST  1 VIEW COMPARISON:  Portable chest 07/07/2019. FINDINGS: Portable AP upright view at 0505 hours. Stable lung volumes and mediastinal contours. Calcified aortic atherosclerosis again noted. Visualized tracheal air column is within normal limits. No pneumothorax. Small residual right pleural effusion suspected. Patchy right lung base opacity not significantly changed. No areas of worsening ventilation. Stable visualized osseous structures. IMPRESSION: 1. Stable suspected small residual right pleural effusion. 2. No new acute cardiopulmonary abnormality. Electronically Signed   By: Genevie Ann M.D.   On: 07/08/2019 08:08   DG Chest 1 View  Result Date: 07/07/2019 CLINICAL DATA:  Malignant recurrent right pleural effusion and status post thoracentesis. EXAM: CHEST  1 VIEW COMPARISON:  CTA of the chest on 07/04/2019 FINDINGS: Stable heart size and tortuosity of the thoracic aorta. No significant residual right pleural fluid after thoracentesis. No pneumothorax. Basilar opacity on the right likely related to known lung mass. No pulmonary edema. IMPRESSION: No significant residual right pleural fluid after thoracentesis. No pneumothorax. Electronically Signed   By: Aletta Edouard M.D.   On: 07/07/2019 16:21   CT Angio Chest PE W/Cm &/Or Wo Cm  Result Date: 07/04/2019 CLINICAL DATA:  LEFT leg swelling.  Short of breath. EXAM: CT ANGIOGRAPHY CHEST WITH CONTRAST TECHNIQUE: Multidetector CT imaging of the chest was performed  using the standard protocol during bolus administration of intravenous contrast. Multiplanar CT image reconstructions and MIPs were obtained to evaluate the vascular anatomy. CONTRAST:  65mL OMNIPAQUE IOHEXOL 350 MG/ML SOLN COMPARISON:  Doppler ultrasound lower extremities 07/04/2019, CT angiogram chest 06/10/2019. FINDINGS: Cardiovascular: No filling defects within the pulmonary arteries to suggest acute pulmonary embolism. Coronary artery calcification and aortic atherosclerotic calcification. Mediastinum/Nodes: No axillary supraclavicular adenopathy. No mediastinal hilar adenopathy no pericardial effusion. Esophagus normal Lungs/Pleura: Large layering RIGHT pleural effusion similar to comparison CT. There is passive atelectasis of the RIGHT lower lobe. No pulmonary infarction identified. Atelectasis within the RIGHT middle lobe is also similar prior. Upper Abdomen: Limited view of the liver, kidneys, pancreas are unremarkable.  Normal adrenal glands. Musculoskeletal: No aggressive osseous lesion. Review of the MIP images confirms the above findings. IMPRESSION: 1. No evidence acute pulmonary embolism. 2. Chronic large RIGHT pleural effusion with passive atelectasis of the RIGHT lung. Electronically Signed   By: Suzy Bouchard M.D.   On: 07/04/2019 14:35   CT ANGIO AO+BIFEM W & OR WO CONTRAST  Result Date: 06/10/2019 CLINICAL DATA:  Soft tissue mass at the thigh with spontaneous hemorrhage. Possible right femoral artery injury status post endovascular intervention. EXAM: CT ANGIOGRAPHY OF ABDOMINAL AORTA WITH ILIOFEMORAL RUNOFF TECHNIQUE: Multidetector CT imaging of the abdomen, pelvis and lower extremities was performed using the standard protocol during bolus administration of intravenous contrast. Multiplanar CT image reconstructions and MIPs were obtained to evaluate the vascular anatomy. CONTRAST:  166mL OMNIPAQUE IOHEXOL 350 MG/ML SOLN COMPARISON:  None. CT dated 06/01/2019.  PET-CT dated Jun 09, 2019  FINDINGS: VASCULAR Aorta: There is a large abdominal aortic aneurysm measuring approximately 6.6 x 6.6 cm. The patient is status post EVAR. There appears to be a persistent type 2 endoleak likely arising from a lumbar artery. Celiac: Patent without evidence of aneurysm, dissection, vasculitis or significant stenosis. SMA: Patent without evidence of aneurysm, dissection, vasculitis or significant stenosis. Renals: Both renal arteries are patent without evidence of aneurysm, dissection, vasculitis, fibromuscular dysplasia or significant stenosis. IMA: The IMA is not appreciated and may be occluded. RIGHT Lower Extremity Inflow: Common, internal and external iliac arteries are patent without evidence of aneurysm, dissection, vasculitis or significant stenosis. Outflow: Common, superficial and profunda femoral arteries and the popliteal artery are patent without evidence of aneurysm, dissection, vasculitis or significant stenosis. Runoff: The proximal anterior tibial, posterior tibial, and peroneal arteries appear to be patent, however there is suboptimal opacification above the knee which may be in part due to contrast bolus timing. LEFT Lower Extremity Inflow: Common, internal and external iliac arteries are patent without evidence of aneurysm, dissection, vasculitis or significant stenosis. Outflow: Common, superficial and profunda femoral arteries and the popliteal artery are patent without evidence of aneurysm, dissection, vasculitis or significant stenosis. There is a small amount of hemorrhage about the left common femoral artery. Runoff: The anterior tibial artery, posterior tibial artery, and peroneal arteries appear to be patent proximally. However, the distal aspects are suboptimally evaluated which is felt to be secondary to suboptimal contrast bolus timing. Veins: No obvious venous abnormality within the limitations of this arterial phase study. Review of the MIP images confirms the above findings.  NON-VASCULAR Lower chest: There is a moderate to large, partially visualized right-sided pleural effusion with adjacent compressive atelectasis.There is cardiomegaly. Vascular calcifications are noted of the partially visualized thoracic aorta. Hepatobiliary: The liver is normal. Status post cholecystectomy.There is no biliary ductal dilation. Pancreas: Normal contours without ductal dilatation. No peripancreatic fluid collection. Spleen: Unremarkable. Adrenals/Urinary Tract: --Adrenal glands: Unremarkable. --Right kidney/ureter: No hydronephrosis or radiopaque kidney stones. --Left kidney/ureter: There is a large exophytic cyst arising from the lower pole of the left kidney measuring approximately 5 cm. --Urinary bladder: A bladder diverticulum is noted. Stomach/Bowel: --Stomach/Duodenum: No hiatal hernia or other gastric abnormality. Normal duodenal course and caliber. --Small bowel: Unremarkable. --Colon: Rectosigmoid diverticulosis without acute inflammation. --Appendix: Normal. Lymphatic: --No retroperitoneal lymphadenopathy. --No mesenteric lymphadenopathy. --No pelvic or inguinal lymphadenopathy. Reproductive: Status post hysterectomy. No adnexal mass. Other: No ascites or free air. The abdominal wall is normal. Musculoskeletal. There is a large hematoma involving the proximal right thigh measuring approximately 9.7 x 3.7 by 10 point 8 cm. There is  no evidence for active extravasation at this time. There is significant surrounding soft tissue edema. There are metallic clips within the bilateral inguinal regions. There is hyperdensity within the perisacral musculature. This is favored to represent contrast extravasation from the patient's recent angiogram. There is a moderate size right knee joint effusion. Degenerative changes are noted of the right knee. Metallic coils are now noted at the level of the L5 vertebral body and sacrum. These are likely related to the recent endovascular intervention. IMPRESSION:  1. Large proximal right thigh hematoma without evidence for active extravasation. There are small metallic clips are noted within the soft tissues of the bilateral inguinal region likely representing failure of the Starclose closure. 2. The patient is status post EVAR. The aneurysmal sac demonstrates slight interval increase in size since the prior study dated 06/01/2019, now measuring 6.6 x 6.6 cm. There is a persistent type 2 endoleak that is favored to be arising from a lumbar artery. 3. Large, partially visualized right-sided pleural effusion with adjacent atelectasis. 4. Unremarkable runoff to the level of the mid calf bilaterally. There is suboptimal opacification of the tibial vasculature distally which is felt to be secondary to suboptimal contrast bolus timing. Correlation with the patient's pedal pulses is recommended. 5. Diverticulosis without CT evidence for diverticulitis. Aortic Atherosclerosis (ICD10-I70.0). These results will be called to the ordering clinician or representative by the Radiologist Assistant, and communication documented in the PACS or [employer]. Electronically Signed   By: Constance Holster M.D.   On: 06/10/2019 19:38   PERIPHERAL VASCULAR CATHETERIZATION  Result Date: 07/06/2019 See op note  PERIPHERAL VASCULAR CATHETERIZATION  Result Date: 06/10/2019 See op note  US Venous Img Lower Unilateral Left  Result Date: 07/04/2019 CLINICAL DATA:  Soft tissue swelling EXAM: LEFT LOWER EXTREMITY VENOUS DUPLEX ULTRASOUND TECHNIQUE: Gray-scale sonography with graded compression, as well as color Doppler and duplex ultrasound were performed to evaluate the left lower extremity deep venous system from the level of the common femoral vein and including the common femoral, femoral, profunda femoral, popliteal and calf veins including the posterior tibial, peroneal and gastrocnemius veins when visible. The superficial great saphenous vein was also interrogated. Spectral Doppler  was utilized to evaluate flow at rest and with distal augmentation maneuvers in the common femoral, femoral and popliteal veins. COMPARISON:  Jul 01, 2019 FINDINGS: Contralateral Common Femoral Vein: There is acute appearing thrombus in the left common femoral vein with loss of compressibility and Doppler signal. Common Femoral Vein: There is absence of flow at the left saphenofemoral junction with loss of apparent flow and compressibility. Saphenofemoral Junction: No evidence of thrombus. Normal compressibility and flow on color Doppler imaging. Profunda Femoral Vein: No evidence of thrombus. Normal compressibility and flow on color Doppler imaging. Femoral Vein: No evidence of thrombus. Normal compressibility, respiratory phasicity and response to augmentation. Popliteal Vein: No evidence of thrombus. Normal compressibility, respiratory phasicity and response to augmentation. Calf Veins: No thrombus evident in the posterior tibial vein. There is incomplete obstruction of the left peroneal vein with diminished flow and diminished compressibility. Superficial Great Saphenous Vein: No evidence of thrombus. Normal compressibility. Venous Reflux:  None. Other Findings:  Patency of the proximal inferior vena cava noted. IMPRESSION: In comparison with recent study, there is now essentially complete occlusion of the left common femoral vein and left saphenofemoral junction regions. Thrombus is no longer appreciable in the left profunda femoral vein. There has been likely propagation of acute deep venous thrombosis more proximally into the left common  femoral junction region. Note that this circumstance places patient at significant increased risk for pulmonary embolus development. Visualized proximal inferior vena cava appears patent without evident thrombus. Electronically Signed   By: Lowella Grip III M.D.   On: 07/04/2019 12:08   US Venous Img Lower Unilateral Left  Result Date: 07/01/2019 CLINICAL DATA:  Left  leg pain and swelling. EXAM: LEFT LOWER EXTREMITY VENOUS DOPPLER ULTRASOUND TECHNIQUE: Gray-scale sonography with graded compression, as well as color Doppler and duplex ultrasound were performed to evaluate the lower extremity deep venous systems from the level of the common femoral vein and including the common femoral, femoral, profunda femoral, popliteal and calf veins including the posterior tibial, peroneal and gastrocnemius veins when visible. The superficial great saphenous vein was also interrogated. Spectral Doppler was utilized to evaluate flow at rest and with distal augmentation maneuvers in the common femoral, femoral and popliteal veins. COMPARISON:  07/02/2004 FINDINGS: Contralateral Common Femoral Vein: Respiratory phasicity is normal and symmetric with the symptomatic side. No evidence of thrombus. Normal compressibility. Common Femoral Vein: Nonocclusive thrombus. Saphenofemoral Junction: Nonocclusive thrombus. Profunda Femoral Vein: Nonocclusive thrombus. Femoral Vein: No evidence of thrombus. Normal compressibility, respiratory phasicity and response to augmentation. Popliteal Vein: No evidence of thrombus. Normal compressibility, respiratory phasicity and response to augmentation. Calf Veins: No evidence of thrombus. Normal compressibility and flow on color Doppler imaging. Superficial Great Saphenous Vein: No evidence of thrombus. Normal compressibility. Venous Reflux:  None. Other Findings:  None. IMPRESSION: Nonocclusive deep venous thrombosis involving the left common femoral vein, profunda femoral vein and saphenofemoral venous junction. Electronically Signed   By: Claudie Revering M.D.   On: 07/01/2019 17:54   US THORACENTESIS ASP PLEURAL SPACE W/IMG GUIDE  Result Date: 07/07/2019 CLINICAL DATA:  Malignant right pleural effusion. EXAM: ULTRASOUND GUIDED RIGHT THORACENTESIS COMPARISON:  CTA of the chest on 07/04/2019 PROCEDURE: An ultrasound guided thoracentesis was thoroughly discussed  with the patient and questions answered. The benefits, risks, alternatives and complications were also discussed. The patient understands and wishes to proceed with the procedure. Written consent was obtained. Ultrasound was performed to localize and mark an adequate pocket of fluid in the right chest. The area was then prepped and draped in the normal sterile fashion. 1% Lidocaine was used for local anesthesia. Under ultrasound guidance a 6 French Safe-T-Centesis catheter was introduced. Thoracentesis was performed. The catheter was removed and a dressing applied. COMPLICATIONS: None FINDINGS: A total of approximately 1.2 L of bloody fluid was removed. IMPRESSION: Successful ultrasound guided right thoracentesis yielding 1.2 L of pleural fluid. Electronically Signed   By: Aletta Edouard M.D.   On: 07/07/2019 16:16   LLE VENOUS DUPLEX 07/04/19 IMPRESSION: In comparison with recent study, there is now essentially complete occlusion of the left common femoral vein and left saphenofemoral junction regions. Thrombus is no longer appreciable in the left profunda femoral vein. There has been likely propagation of acute deep venous thrombosis more proximally into the left common femoral junction region. Note that this circumstance places patient at significant increased risk for pulmonary embolus development.  Visualized proximal inferior vena cava appears patent without evident thrombus.  PROCEDURE 07/06/19 by Gail. Lucky Nelson 1. US guidance for vascular access to left popliteal vein 2. Catheter placement into left common iliac veinfrom left poplitealapproach 3. IVC gram and left lower extremityvenogram 4. Mechanical thrombectomy to proximal left superficial femoral vein, common femoral vein, external and common iliac veins with the penumbra CAT 12 device 5. PTA of left external and common iliac veins  with 10 mm diameter angioplasty balloon  Thoracentesis was done 07/07/2019 and drained 1.2 L of  bloody fluid from the right pleural space  Subjective: Seen and Examined at bedside and states that she is doing much better today and feels little bit stronger.  States that her respiratory status is doing good and not complaining of much pain in her leg.  No lightheadedness or dizziness.  Wants to go to rehab before going home and stable for discharge.  No other concerns or plans at this time.  Discharge Exam: Vitals:   07/10/19 0040 07/10/19 0904  BP: 121/62 135/64  Pulse: 82 78  Resp: 17 19  Temp: 98 F (36.7 C) 98 F (36.7 C)  SpO2: 96% 96%   Vitals:   07/09/19 0042 07/09/19 0824 07/10/19 0040 07/10/19 0904  BP: (!) 146/79 (!) 141/70 121/62 135/64  Pulse: 98 99 82 78  Resp: 18 17 17 19   Temp: 97.7 F (36.5 C) 97.8 F (36.6 C) 98 F (36.7 C) 98 F (36.7 C)  TempSrc: Oral Oral Oral Oral  SpO2: 97% 94% 96% 96%  Weight:      Height:       General: Pt is alert, awake, not in acute distress Cardiovascular: RRR, S1/S2 +, no rubs, no gallops Respiratory: Diminished bilaterally with coarse breath sounds worse on the right compared to left, no wheezing, no rhonchi; wearing supplemental oxygen via nasal cannula again Abdominal: Soft, NT, distended secondary body habitus, bowel sounds + Extremities: Trace edema and has some bruising on her right leg and left leg now unwrapped and a little swollen but not as tender, no cyanosis appreciated  The results of significant diagnostics from this hospitalization (including imaging, microbiology, ancillary and laboratory) are listed below for reference.    Microbiology: Recent Results (from the past 240 hour(s))  SARS Coronavirus 2 by RT PCR (hospital order, performed in [facility] hospital lab) Nasopharyngeal Nasopharyngeal Swab     Status: None   Collection Time: 07/04/19  3:54 PM   Specimen: Nasopharyngeal Swab  Result Value Ref Range Status   SARS Coronavirus 2 NEGATIVE NEGATIVE Final    Comment: (NOTE) SARS-CoV-2 target nucleic  acids are NOT DETECTED. The SARS-CoV-2 RNA is generally detectable in upper and lower respiratory specimens during the acute phase of infection. The lowest concentration of SARS-CoV-2 viral copies this assay can detect is 250 copies / mL. A negative result does not preclude SARS-CoV-2 infection and should not be used as the sole basis for treatment or other patient management decisions.  A negative result may occur with improper specimen collection / handling, submission of specimen other than nasopharyngeal swab, presence of viral mutation(s) within the areas targeted by this assay, and inadequate number of viral copies (<250 copies / mL). A negative result must be combined with clinical observations, patient history, and epidemiological information. Fact Sheet for Patients:   StrictlyIdeas.no Fact Sheet for Healthcare Providers: BankingDealers.co.za This test is not yet approved or cleared  by the [place] FDA and has been authorized for detection and/or diagnosis of SARS-CoV-2 by FDA under an Emergency Use Authorization (EUA).  This EUA will remain in effect (meaning this test can be used) for the duration of the COVID-19 declaration under Section 564(b)(1) of the Act, 21 U.S.C. section 360bbb-3(b)(1), unless the authorization is terminated or revoked sooner. Performed at [facility], [address] [place], [place] [address]   Body fluid culture     Status: None (Preliminary result)   Collection  Time: 07/07/19  4:00 PM   Specimen: PATH Cytology Peritoneal fluid  Result Value Ref Range Status   Specimen Description   Final    PERITONEAL Performed at [facility], [address] [place], [place] [address]    Special Requests   Final    NONE Performed at [facility], [address] [place], [place] [address]    Gram Stain   Final    RARE WBC PRESENT,BOTH PMN AND MONONUCLEAR NO ORGANISMS  SEEN    Culture   Final    NO GROWTH 2 DAYS Performed at [facility], [place] [address] [place], [place] [address]    Report Status PENDING  Incomplete    Labs: BNP (last 3 results) Recent Labs    07/04/19 1243  BNP 79.0   Basic Metabolic Panel: Recent Labs  Lab 07/05/19 0419 07/06/19 0159 07/07/19 0800 07/08/19 0615 07/09/19 0549  NA 138 138 140 138 139  K 4.2 4.0 4.2 3.9 3.7  CL 96* 97* 99 97* 97*  CO2 31 30 31 31 31   GLUCOSE 108* 117* 106* 106* 100*  BUN 21 21 13 12 12   CREATININE 0.91 1.04* 0.83 0.96 0.75  CALCIUM 9.2 9.1 9.1 9.0 8.9  MG  --  2.1 2.0 1.8 1.7  PHOS  --  3.4 3.5 3.5 3.8   Liver Function Tests: Recent Labs  Lab 07/06/19 0159 07/07/19 0800 07/08/19 0615 07/09/19 0549  AST 17 16 17  14*  ALT 6 6 5  <5  ALKPHOS 53 50 49 45  BILITOT 0.9 0.8 0.8 0.8  PROT 6.3* 6.2* 5.8* 5.6*  ALBUMIN 3.4* 3.3* 3.2* 3.0*   No results for input(s): LIPASE, AMYLASE in the last 168 hours. No results for input(s): AMMONIA in the last 168 hours. CBC: Recent Labs  Lab 07/04/19 1243 07/04/19 1243 07/05/19 0419 07/06/19 0159 07/07/19 0800 07/08/19 0615 07/09/19 0549  WBC 7.0   < > 6.5 5.4 5.2 5.7 5.2  NEUTROABS 5.6  --   --  3.9 3.8 4.5 3.8  HGB 11.9*   < > 11.9* 11.3* 10.6* 10.6* 10.0*  HCT 36.9   < > 37.5 36.0 33.9* 32.6* 31.3*  MCV 90.7   < > 90.1 92.8 92.4 91.1 90.2  PLT 181   < > 203 183 179 168 176   < > = values in this interval not displayed.   Cardiac Enzymes: No results for input(s): CKTOTAL, CKMB, CKMBINDEX, TROPONINI in the last 168 hours. BNP: Invalid input(s): POCBNP CBG: No results for input(s): GLUCAP in the last 168 hours. D-Dimer No results for input(s): DDIMER in the last 72 hours. Hgb A1c No results for input(s): HGBA1C in the last 72 hours. Lipid Profile No results for input(s): CHOL, HDL, LDLCALC, TRIG, CHOLHDL, LDLDIRECT in the last 72 hours. Thyroid function studies No results for input(s): TSH, T4TOTAL, T3FREE, THYROIDAB  in the last 72 hours.  Invalid input(s): FREET3 Anemia work up No results for input(s): VITAMINB12, FOLATE, FERRITIN, TIBC, IRON, RETICCTPCT in the last 72 hours. Urinalysis    Component Value Date/Time   APPEARANCEUR Clear 03/14/2019 0000   GLUCOSEU Negative 03/14/2019 0000   BILIRUBINUR Negative 03/14/2019 0000   PROTEINUR Negative 03/14/2019 0000   UROBILINOGEN 0.2 01/14/2018 1406   NITRITE Negative 03/14/2019 0000   LEUKOCYTESUR Negative 03/14/2019 0000   Sepsis Labs Invalid input(s): PROCALCITONIN,  WBC,  LACTICIDVEN Microbiology Recent Results (from the past 240 hour(s))  SARS Coronavirus 2 by RT PCR (hospital order, performed  in [facility] lab) Nasopharyngeal Nasopharyngeal Swab     Status: None   Collection Time: 07/04/19  3:54 PM   Specimen: Nasopharyngeal Swab  Result Value Ref Range Status   SARS Coronavirus 2 NEGATIVE NEGATIVE Final    Comment: (NOTE) SARS-CoV-2 target nucleic acids are NOT DETECTED. The SARS-CoV-2 RNA is generally detectable in upper and lower respiratory specimens during the acute phase of infection. The lowest concentration of SARS-CoV-2 viral copies this assay can detect is 250 copies / mL. A negative result does not preclude SARS-CoV-2 infection and should not be used as the sole basis for treatment or other patient management decisions.  A negative result may occur with improper specimen collection / handling, submission of specimen other than nasopharyngeal swab, presence of viral mutation(s) within the areas targeted by this assay, and inadequate number of viral copies (<250 copies / mL). A negative result must be combined with clinical observations, patient history, and epidemiological information. Fact Sheet for Patients:   StrictlyIdeas.no Fact Sheet for Healthcare Providers: BankingDealers.co.za This test is not yet approved or cleared  by the [place] FDA and has been  authorized for detection and/or diagnosis of SARS-CoV-2 by FDA under an Emergency Use Authorization (EUA).  This EUA will remain in effect (meaning this test can be used) for the duration of the COVID-19 declaration under Section 564(b)(1) of the Act, 21 U.S.C. section 360bbb-3(b)(1), unless the authorization is terminated or revoked sooner. Performed at [facility], [address] [place], [place] [address]   Body fluid culture     Status: None (Preliminary result)   Collection Time: 07/07/19  4:00 PM   Specimen: PATH Cytology Peritoneal fluid  Result Value Ref Range Status   Specimen Description   Final    PERITONEAL Performed at [facility], [address] [place], [place] [address]    Special Requests   Final    NONE Performed at [facility], [address] [place], [place] [address]    Gram Stain   Final    RARE WBC PRESENT,BOTH PMN AND MONONUCLEAR NO ORGANISMS SEEN    Culture   Final    NO GROWTH 2 DAYS Performed at [facility], [place] [address] [place], [place] [address]    Report Status PENDING  Incomplete   Time coordinating discharge: 35 minutes  SIGNED:  Kerney Elbe, DO [facility] 07/10/2019, 11:44 AM Pager is on [place]  If 7PM-7AM, please contact night-coverage [email or web address]

## 2019-07-10 NOTE — Progress Notes (Addendum)
   07/10/19 1319  Clinical Encounter Type  Visited With Patient  Visit Type Follow-up;Spiritual support  Referral From Chaplain  Consult/Referral To Chaplain  Chaplain stopped by to visit with patient at 1:19 but she was sleeping.  Chaplain stopped back by later and notice she was dressed in her on clothes, therefore chaplain asked if patient was being discharged and told yes. Chaplain went back to the room and knocked on the opened door and patient woke up, happy to see chaplain. Chaplain expressed how happy she was to hear that patient is being discharged. Patient told chaplain that when she mentioned [facility] Almond to her daughter that her daughter said she knew chaplain from witnessing for an AD. Chaplain told patient to give her regards to her daughter. Patient told chaplain that she is going to a rehab for a week and than home. She also told chaplain that her son is here from [facility] to see her. Chaplain prayed with patient, wished her well, and left. It was always a pleasure to see Ms. [place].

## 2019-07-11 ENCOUNTER — Inpatient Hospital Stay: Payer: Medicare Other

## 2019-07-11 ENCOUNTER — Other Ambulatory Visit: Payer: Self-pay | Admitting: *Deleted

## 2019-07-11 ENCOUNTER — Telehealth (INDEPENDENT_AMBULATORY_CARE_PROVIDER_SITE_OTHER): Payer: Self-pay

## 2019-07-11 ENCOUNTER — Inpatient Hospital Stay: Payer: Medicare Other | Admitting: Hospice and Palliative Medicine

## 2019-07-11 ENCOUNTER — Inpatient Hospital Stay: Payer: Medicare Other | Admitting: Internal Medicine

## 2019-07-11 ENCOUNTER — Telehealth: Payer: Self-pay | Admitting: *Deleted

## 2019-07-11 ENCOUNTER — Other Ambulatory Visit (INDEPENDENT_AMBULATORY_CARE_PROVIDER_SITE_OTHER): Payer: Self-pay | Admitting: Nurse Practitioner

## 2019-07-11 ENCOUNTER — Other Ambulatory Visit: Payer: Self-pay | Admitting: Hospice and Palliative Medicine

## 2019-07-11 DIAGNOSIS — Z515 Encounter for palliative care: Secondary | ICD-10-CM

## 2019-07-11 DIAGNOSIS — C3431 Malignant neoplasm of lower lobe, right bronchus or lung: Secondary | ICD-10-CM

## 2019-07-11 DIAGNOSIS — R0602 Shortness of breath: Secondary | ICD-10-CM

## 2019-07-11 DIAGNOSIS — Z9181 History of falling: Secondary | ICD-10-CM

## 2019-07-11 LAB — CYTOLOGY - NON PAP

## 2019-07-11 MED ORDER — HYDROCODONE-ACETAMINOPHEN 5-325 MG PO TABS: 1.0000 | ORAL_TABLET | Freq: Four times a day (QID) | ORAL | 0 refills | Status: DC | PRN

## 2019-07-11 NOTE — Telephone Encounter (Addendum)
Daughter called to request a Home Health referral be made for her mother for Nursing, PT, Home Health Aide. She was discharged from hospital and she did not like the facility that they were going to transfer her to "they are on lock down and I was not going to be able to see her. She needs to get her strength back in her legs after the thrombectomy and I need some help" She also wanted to update her pharmacy from [place] to [facility] on [address] which I have done. Please advise if Home health can be ordered and let me know so that I can let her know once referral has been sent.

## 2019-07-11 NOTE — Telephone Encounter (Signed)
The pt called and said that she needs a refill on her pain medicine she is taking Norco 5 Mg every 6 hours pain. She just had a Peripheral vascula catheterization done on her left leg on the 2nd of June by Dr. Lucky Cowboy.

## 2019-07-11 NOTE — Telephone Encounter (Signed)
That is where the refill was sent

## 2019-07-11 NOTE — Telephone Encounter (Signed)
Please send it to the [facility] on [address]  pharmacy per the pt request I have called the pt and made her aware

## 2019-07-11 NOTE — Telephone Encounter (Signed)
Refill sent.

## 2019-07-11 NOTE — Telephone Encounter (Signed)
T- ok with home health. Please order.   C- Recommend follow up next week; MD- labs; cbc/bmp; Josh appt; Dr.B

## 2019-07-11 NOTE — Telephone Encounter (Signed)
I called the pt and made her aware that it as sent.

## 2019-07-11 NOTE — Telephone Encounter (Signed)
Home health referral initiated. msg sent to [facility] at [facility] to initiate referral.

## 2019-07-12 ENCOUNTER — Other Ambulatory Visit: Payer: Self-pay

## 2019-07-12 ENCOUNTER — Other Ambulatory Visit: Payer: Self-pay | Admitting: Hospice and Palliative Medicine

## 2019-07-12 ENCOUNTER — Encounter (INDEPENDENT_AMBULATORY_CARE_PROVIDER_SITE_OTHER): Payer: Self-pay | Admitting: Vascular Surgery

## 2019-07-12 ENCOUNTER — Ambulatory Visit (INDEPENDENT_AMBULATORY_CARE_PROVIDER_SITE_OTHER): Payer: Medicare Other | Admitting: Vascular Surgery

## 2019-07-12 VITALS — BP 144/75 | HR 98 | Resp 16 | Ht 62.0 in | Wt 190.0 lb

## 2019-07-12 DIAGNOSIS — C3431 Malignant neoplasm of lower lobe, right bronchus or lung: Secondary | ICD-10-CM | POA: Diagnosis not present

## 2019-07-12 DIAGNOSIS — I714 Abdominal aortic aneurysm, without rupture, unspecified: Secondary | ICD-10-CM

## 2019-07-12 DIAGNOSIS — I82412 Acute embolism and thrombosis of left femoral vein: Secondary | ICD-10-CM

## 2019-07-12 DIAGNOSIS — Z515 Encounter for palliative care: Secondary | ICD-10-CM

## 2019-07-12 DIAGNOSIS — I10 Essential (primary) hypertension: Secondary | ICD-10-CM | POA: Diagnosis not present

## 2019-07-12 DIAGNOSIS — I890 Lymphedema, not elsewhere classified: Secondary | ICD-10-CM

## 2019-07-12 NOTE — Assessment & Plan Note (Signed)
Status post thrombectomy for severe DVT with marked pain and swelling.  Much improved after this procedure.  Seems to be tolerating anticoagulation so far.  Reassess in about a month with a duplex.  Is at high risk for recurrent thrombosis due to her malignancy, but as long as she tolerates anticoagulation this risk is still relatively low.

## 2019-07-12 NOTE — Assessment & Plan Note (Signed)
Tolerating anticoagulation and had minimal residual thrombus after thrombectomy a week ago.  At this point, I think it is safe to get her back in her lymphedema pump which would help her lower extremity swelling on both sides significantly.

## 2019-07-12 NOTE — Progress Notes (Signed)
Home palliative care

## 2019-07-12 NOTE — Patient Instructions (Signed)

## 2019-07-12 NOTE — Progress Notes (Signed)
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 measurements may suggest ACS but many other  chronic and acute conditions are known to elevate hsTnI results.  Refer to the "Links" section for chest pain algorithms and additional   guidance. Performed at [facility], [address] [place], [place] [address]   Brain natriuretic peptide     Status: None   Collection Time: 07/04/19 12:43 PM  Result Value Ref Range   B Natriuretic Peptide 68.9 0.0 - 100.0 pg/mL    Comment: Performed at [facility], [address] [place], [place] [address]  Protime-INR     Status: Abnormal   Collection Time: 07/04/19 12:43 PM  Result Value Ref Range   Prothrombin Time 19.8 (H) 11.4 - 15.2 seconds   INR 1.8 (H) 0.8 - 1.2    Comment: (NOTE) INR goal varies based on device and disease states. Performed at [facility], [place], [place] [address]   Troponin I (High Sensitivity)     Status: None   Collection Time: 07/04/19  3:54 PM  Result Value Ref Range   Troponin I (High Sensitivity) 12 <18 ng/L    Comment: (NOTE) Elevated high sensitivity troponin I (hsTnI) values and significant  changes across serial measurements may suggest ACS but many other  chronic and acute conditions are known to elevate hsTnI results.  Refer to the "Links" section for chest pain algorithms and additional  guidance. Performed at [facility], [address] [place], [place] [address]   SARS Coronavirus 2 by RT PCR (hospital order, performed in [facility] hospital lab) Nasopharyngeal Nasopharyngeal Swab     Status: None   Collection Time: 07/04/19  3:54 PM   Specimen: Nasopharyngeal Swab  Result Value Ref Range   SARS Coronavirus 2 NEGATIVE NEGATIVE    Comment: (NOTE) SARS-CoV-2 target nucleic acids are NOT DETECTED. The SARS-CoV-2 RNA is generally detectable in upper and lower respiratory specimens during the acute phase of infection. The lowest concentration of SARS-CoV-2 viral copies this assay can detect is 250 copies / mL. A negative result does not preclude SARS-CoV-2 infection and should not be used as the sole basis for treatment or  other patient management decisions.  A  negative result may occur with improper specimen collection / handling, submission of specimen other than nasopharyngeal swab, presence of viral mutation(s) within the areas targeted by this assay, and inadequate number of viral copies (<250 copies / mL). A negative result must be combined with clinical observations, patient history, and epidemiological information. Fact Sheet for Patients:   StrictlyIdeas.no Fact Sheet for Healthcare Providers: BankingDealers.co.za This test is not yet approved or cleared  by the [place] FDA and has been authorized for detection and/or diagnosis of SARS-CoV-2 by FDA under an Emergency Use Authorization (EUA).  This EUA will remain in effect (meaning this test can be used) for the duration of the COVID-19 declaration under Section 564(b)(1) of the Act, 21 U.S.C. section 360bbb-3(b)(1), unless the authorization is terminated or revoked sooner. Performed at [facility], [place], [place] [address]   Heparin level (unfractionated)     Status: Abnormal   Collection Time: 07/04/19  3:54 PM  Result Value Ref Range   Heparin Unfractionated >3.60 (H) 0.30 - 0.70 IU/mL    Comment: RESULTS CONFIRMED BY MANUAL DILUTION (NOTE) If heparin results are below expected values, and patient dosage has  been confirmed, suggest follow up testing of antithrombin III levels. Performed at [facility], [address] [place], [place] [address]   APTT     Status: Abnormal   Collection Time: 07/04/19  3:54 PM  Result Value Ref Range   aPTT 44 (H) 24 - 36 seconds    Comment:        IF BASELINE aPTT IS ELEVATED, SUGGEST PATIENT RISK ASSESSMENT BE USED TO DETERMINE APPROPRIATE ANTICOAGULANT THERAPY. Performed at [facility], [address] [place], [place] [address]   Basic metabolic panel     Status: Abnormal   Collection Time: 07/05/19  4:19 AM  Result Value Ref  Range   Sodium 138 135 - 145 mmol/L   Potassium 4.2 3.5 - 5.1 mmol/L   Chloride 96 (L) 98 - 111 mmol/L   CO2 31 22 - 32 mmol/L   Glucose, Bld 108 (H) 70 - 99 mg/dL    Comment: Glucose reference range applies only to samples taken after fasting for at least 8 hours.   BUN 21 8 - 23 mg/dL   Creatinine, Ser 0.91 0.44 - 1.00 mg/dL   Calcium 9.2 8.9 - 10.3 mg/dL   GFR calc non Af Amer 54 (L) >60 mL/min   GFR calc Af Amer >60 >60 mL/min   Anion gap 11 5 - 15    Comment: Performed at [facility], [address] [place], [place] [address]  CBC     Status: Abnormal   Collection Time: 07/05/19  4:19 AM  Result Value Ref Range   WBC 6.5 4.0 - 10.5 K/uL   RBC 4.16 3.87 - 5.11 MIL/uL   Hemoglobin 11.9 (L) 12.0 - 15.0 g/dL   HCT 37.5 36.0 - 46.0 %   MCV 90.1 80.0 - 100.0 fL   MCH 28.6 26.0 - 34.0 pg   MCHC 31.7 30.0 - 36.0 g/dL   RDW 16.7 (H) 11.5 - 15.5 %   Platelets 203 150 - 400 K/uL   nRBC 0.0 0.0 - 0.2 %    Comment: Performed at [facility], [address] [place], [place] [address]  APTT     Status: Abnormal   Collection Time: 07/05/19  4:19 AM  Result Value Ref Range  aPTT >160 (HH) 24 - 36 seconds    Comment: CRITICAL RESULT CALLED TO, READ BACK BY AND VERIFIED WITH:  KAT NARDOZZI AT 0616 07/05/19 SDR        IF BASELINE aPTT IS ELEVATED, SUGGEST PATIENT RISK ASSESSMENT BE USED TO DETERMINE APPROPRIATE ANTICOAGULANT THERAPY. Performed at [facility], [place], [place] [address]   Heparin level (unfractionated)     Status: Abnormal   Collection Time: 07/05/19  4:19 AM  Result Value Ref Range   Heparin Unfractionated >3.60 (H) 0.30 - 0.70 IU/mL    Comment: RESULTS CONFIRMED BY MANUAL DILUTION (NOTE) If heparin results are below expected values, and patient dosage has  been confirmed, suggest follow up testing of antithrombin III levels. Performed at [facility], [address] [place], [place] [address]   APTT      Status: Abnormal   Collection Time: 07/05/19  3:47 PM  Result Value Ref Range   aPTT >160 (HH) 24 - 36 seconds    Comment:        IF BASELINE aPTT IS ELEVATED, SUGGEST PATIENT RISK ASSESSMENT BE USED TO DETERMINE APPROPRIATE ANTICOAGULANT THERAPY. CRITICAL RESULT CALLED TO, READ BACK BY AND VERIFIED WITH: CHRIS BENNETT RN @1636  07/05/2019 BY ACR Performed at [facility], [address] [place], [place] [address]   APTT     Status: Abnormal   Collection Time: 07/06/19  1:59 AM  Result Value Ref Range   aPTT 119 (H) 24 - 36 seconds    Comment:        IF BASELINE aPTT IS ELEVATED, SUGGEST PATIENT RISK ASSESSMENT BE USED TO DETERMINE APPROPRIATE ANTICOAGULANT THERAPY. Performed at [facility], [place], [place] [address]   Heparin level (unfractionated)     Status: Abnormal   Collection Time: 07/06/19  1:59 AM  Result Value Ref Range   Heparin Unfractionated >3.60 (H) 0.30 - 0.70 IU/mL    Comment: RESULTS CONFIRMED BY MANUAL DILUTION (NOTE) If heparin results are below expected values, and patient dosage has  been confirmed, suggest follow up testing of antithrombin III levels. Performed at [facility], [address] [place], [place] [address]   Comprehensive metabolic panel     Status: Abnormal   Collection Time: 07/06/19  1:59 AM  Result Value Ref Range   Sodium 138 135 - 145 mmol/L   Potassium 4.0 3.5 - 5.1 mmol/L   Chloride 97 (L) 98 - 111 mmol/L   CO2 30 22 - 32 mmol/L   Glucose, Bld 117 (H) 70 - 99 mg/dL    Comment: Glucose reference range applies only to samples taken after fasting for at least 8 hours.   BUN 21 8 - 23 mg/dL   Creatinine, Ser 1.04 (H) 0.44 - 1.00 mg/dL   Calcium 9.1 8.9 - 10.3 mg/dL   Total Protein 6.3 (L) 6.5 - 8.1 g/dL   Albumin 3.4 (L) 3.5 - 5.0 g/dL   AST 17 15 - 41 U/L   ALT 6 0 - 44 U/L   Alkaline Phosphatase 53 38 - 126 U/L   Total Bilirubin 0.9 0.3 - 1.2 mg/dL   GFR calc non Af Amer 46  (L) >60 mL/min   GFR calc Af Amer 54 (L) >60 mL/min   Anion gap 11 5 - 15    Comment: Performed at [facility], [address], [place], [place] [address]  Magnesium     Status: None   Collection Time:  07/06/19  1:59 AM  Result Value Ref Range   Magnesium 2.1 1.7 - 2.4 mg/dL    Comment: Performed at [facility], [address] [place], [place] [address]  Phosphorus     Status: None   Collection Time: 07/06/19  1:59 AM  Result Value Ref Range   Phosphorus 3.4 2.5 - 4.6 mg/dL    Comment: Performed at [facility], [address] [place], [place] [address]  CBC with Differential/Platelet     Status: Abnormal   Collection Time: 07/06/19  1:59 AM  Result Value Ref Range   WBC 5.4 4.0 - 10.5 K/uL   RBC 3.88 3.87 - 5.11 MIL/uL   Hemoglobin 11.3 (L) 12.0 - 15.0 g/dL   HCT 36.0 36.0 - 46.0 %   MCV 92.8 80.0 - 100.0 fL   MCH 29.1 26.0 - 34.0 pg   MCHC 31.4 30.0 - 36.0 g/dL   RDW 16.7 (H) 11.5 - 15.5 %   Platelets 183 150 - 400 K/uL   nRBC 0.0 0.0 - 0.2 %   Neutrophils Relative % 71 %   Neutro Abs 3.9 1.7 - 7.7 K/uL   Lymphocytes Relative 10 %   Lymphs Abs 0.6 (L) 0.7 - 4.0 K/uL   Monocytes Relative 13 %   Monocytes Absolute 0.7 0.1 - 1.0 K/uL   Eosinophils Relative 4 %   Eosinophils Absolute 0.2 0.0 - 0.5 K/uL   Basophils Relative 1 %   Basophils Absolute 0.0 0.0 - 0.1 K/uL   Immature Granulocytes 1 %   Abs Immature Granulocytes 0.06 0.00 - 0.07 K/uL    Comment: Performed at [facility], [address] [place], [place] [address]  APTT     Status: Abnormal   Collection Time: 07/06/19  9:53 PM  Result Value Ref Range   aPTT 63 (H) 24 - 36 seconds    Comment:        IF BASELINE aPTT IS ELEVATED, SUGGEST PATIENT RISK ASSESSMENT BE USED TO DETERMINE APPROPRIATE ANTICOAGULANT THERAPY. Performed at [facility], [address] [place], [place] [address]   Comprehensive metabolic panel     Status: Abnormal   Collection Time:  07/07/19  8:00 AM  Result Value Ref Range   Sodium 140 135 - 145 mmol/L   Potassium 4.2 3.5 - 5.1 mmol/L   Chloride 99 98 - 111 mmol/L   CO2 31 22 - 32 mmol/L   Glucose, Bld 106 (H) 70 - 99 mg/dL    Comment: Glucose reference range applies only to samples taken after fasting for at least 8 hours.   BUN 13 8 - 23 mg/dL   Creatinine, Ser 0.83 0.44 - 1.00 mg/dL   Calcium 9.1 8.9 - 10.3 mg/dL   Total Protein 6.2 (L) 6.5 - 8.1 g/dL   Albumin 3.3 (L) 3.5 - 5.0 g/dL   AST 16 15 - 41 U/L   ALT 6 0 - 44 U/L   Alkaline Phosphatase 50 38 - 126 U/L   Total Bilirubin 0.8 0.3 - 1.2 mg/dL   GFR calc non Af Amer >60 >60 mL/min   GFR calc Af Amer >60 >60 mL/min   Anion gap 10 5 - 15    Comment: Performed at [facility], [address], [place], [place] [address]  Magnesium     Status: None   Collection Time: 07/07/19  8:00 AM  Result Value Ref Range   Magnesium 2.0 1.7 - 2.4 mg/dL  Comment: Performed at [facility], [address] [place], [place] [address]  Phosphorus     Status: None   Collection Time: 07/07/19  8:00 AM  Result Value Ref Range   Phosphorus 3.5 2.5 - 4.6 mg/dL    Comment: Performed at [facility], [address] [place], [place] [address]  CBC with Differential/Platelet     Status: Abnormal   Collection Time: 07/07/19  8:00 AM  Result Value Ref Range   WBC 5.2 4.0 - 10.5 K/uL   RBC 3.67 (L) 3.87 - 5.11 MIL/uL   Hemoglobin 10.6 (L) 12.0 - 15.0 g/dL   HCT 33.9 (L) 36.0 - 46.0 %   MCV 92.4 80.0 - 100.0 fL   MCH 28.9 26.0 - 34.0 pg   MCHC 31.3 30.0 - 36.0 g/dL   RDW 16.4 (H) 11.5 - 15.5 %   Platelets 179 150 - 400 K/uL   nRBC 0.0 0.0 - 0.2 %   Neutrophils Relative % 74 %   Neutro Abs 3.8 1.7 - 7.7 K/uL   Lymphocytes Relative 10 %   Lymphs Abs 0.5 (L) 0.7 - 4.0 K/uL   Monocytes Relative 12 %   Monocytes Absolute 0.6 0.1 - 1.0 K/uL   Eosinophils Relative 3 %   Eosinophils Absolute 0.2 0.0 - 0.5 K/uL   Basophils Relative 0 %    Basophils Absolute 0.0 0.0 - 0.1 K/uL   Immature Granulocytes 1 %   Abs Immature Granulocytes 0.07 0.00 - 0.07 K/uL    Comment: Performed at [facility], [address] [place], [place] [address]  Vitamin B12     Status: Abnormal   Collection Time: 07/07/19  8:00 AM  Result Value Ref Range   Vitamin B-12 3,183 (H) 180 - 914 pg/mL    Comment: (NOTE) This assay is not validated for testing neonatal or myeloproliferative syndrome specimens for Vitamin B12 levels. Performed at [facility], [place] [address], [place], [place] [address]   Folate     Status: Abnormal   Collection Time: 07/07/19  8:00 AM  Result Value Ref Range   Folate 5.9 (L) >5.9 ng/mL    Comment: Performed at [facility], [address] [place], [place] [address]  Iron and TIBC     Status: None   Collection Time: 07/07/19  8:00 AM  Result Value Ref Range   Iron 39 28 - 170 ug/dL   TIBC 291 250 - 450 ug/dL   Saturation Ratios 13 10.4 - 31.8 %   UIBC 252 ug/dL    Comment: Performed at [facility], [address] [place], [place] [address]  Ferritin     Status: None   Collection Time: 07/07/19  8:00 AM  Result Value Ref Range   Ferritin 110 11 - 307 ng/mL    Comment: Performed at [facility], [address] [place], [place] [address]  Reticulocytes     Status: Abnormal   Collection Time: 07/07/19  8:00 AM  Result Value Ref Range   Retic Ct Pct 2.6 0.4 - 3.1 %   RBC. 3.63 (L) 3.87 - 5.11 MIL/uL   Retic Count, Absolute 93.3 19.0 - 186.0 K/uL   Immature Retic Fract 13.0 2.3 - 15.9 %    Comment: Performed at [facility], [address], [place], [place] [address]  APTT     Status: Abnormal   Collection Time: 07/07/19  8:00 AM  Result Value Ref Range   aPTT  46 (H) 24 - 36 seconds    Comment:        IF BASELINE aPTT IS ELEVATED, SUGGEST PATIENT RISK ASSESSMENT BE USED TO DETERMINE APPROPRIATE ANTICOAGULANT THERAPY. Performed at [facility],  [address] [place], [place] [address]   Cytology - Non PAP;     Status: None   Collection Time: 07/07/19  4:00 PM  Result Value Ref Range   CYTOLOGY - NON GYN      CYTOLOGY - NON PAP CASE: [id number] PATIENT: Marciana Bogle Non-Gynecological Cytology Report     Specimen Submitted: A. Pleural fluid, right  Clinical History: None provided    DIAGNOSIS: A. PLEURAL FLUID, RIGHT; ULTRASOUND-GUIDED THORACENTESIS: - POSITIVE FOR MALIGNANCY. - METASTATIC ADENOCARCINOMA, COMPATIBLE WITH PULMONARY PRIMARY, SIMILAR TO PREVIOUS ([id number]).  Slides reviewed: 1 ThinPrep, 1 cell block, 1 cytospin  GROSS DESCRIPTION: A. Labeled: Right pleural fluid Received: Fresh Volume: 1000 mL Description of fluid and container in which it is received: Received is red, cloudy fluid in a 1000 mL glass evacuated container. Cytospin slide(s) received: Yes, 1  Specimen material submitted for: Cell block and ThinPrep  The cell block material is fixed in formalin for 6 hours prior to processing.   Final Diagnosis performed by Allena Napoleon, MD.   Electronically signed 07/11/2019 2:37:33PM The electronic signature indicates that the named At tending Pathologist has evaluated the specimen Technical component performed at [facility], [address], [place], [place] [address] Lab: [phone] Dir: Rush Farmer, MD, MMM  Professional component performed at [facility], [place], [place], [place] [address] Lab: [phone] Dir: Dellia Nims. Rubinas, MD   Lactate dehydrogenase (pleural or peritoneal fluid)     Status: Abnormal   Collection Time: 07/07/19  4:00 PM  Result Value Ref Range   LD, Fluid 216 (H) 3 - 23 U/L    Comment: HEMOLYSIS AT THIS LEVEL MAY AFFECT RESULT (NOTE) Results should be evaluated in conjunction with serum values    Fluid Type-FLDH CYTO PERI     Comment: Performed at [facility], [address] [place], [place] [address]   Triglycerides, Body Fluid     Status: None   Collection Time: 07/07/19  4:00 PM  Result Value Ref Range   Triglycerides, Fluid 27 Not Estab. mg/dL    Comment: (NOTE) The reference intervals and other method performance specifications have not been established for this test. The test result should be integrated into the clinical context for interpretation. The reference interval(s) and other method performance specifications have not been established for this body fluid. The test result must be integrated into the clinical context for interpretation. Performed At: [facility] [address] [place], [place] [id number] Rush Farmer MD NU:[id number]    Fluid Type-FTRIG CYTO PERI     Comment: Performed at [facility], [address] [place], [place] [address]  Body fluid cell count with differential     Status: Abnormal   Collection Time: 07/07/19  4:00 PM  Result Value Ref Range   Fluid Type-FCT CYTO PERI    Color, Fluid RED (A) YELLOW   Appearance, Fluid TURBID (A) CLEAR   Total Nucleated Cell Count, Fluid 195 cu mm   Neutrophil Count, Fluid 7 %   Lymphs, Fluid 74 %   Monocyte-Macrophage-Serous Fluid 19 %   Eos, Fluid 0 %    Comment: Performed at [facility], [address] [place], [place] [address]  Body fluid culture     Status: None  Collection Time: 07/07/19  4:00 PM   Specimen: PATH Cytology Peritoneal fluid  Result Value Ref Range   Specimen Description      PERITONEAL Performed at [facility], [address] [place], [place] [address]    Special Requests      NONE Performed at [facility], [place], [place] [address]    Gram Stain      RARE WBC PRESENT,BOTH PMN AND MONONUCLEAR NO ORGANISMS SEEN    Culture      NO GROWTH 3 DAYS Performed at [facility], [place] [address] [place], [place] [address]    Report Status 07/10/2019 FINAL   Albumin, pleural or peritoneal fluid     Status:  None   Collection Time: 07/07/19  4:00 PM  Result Value Ref Range   Albumin, Fluid 2.6 g/dL    Comment: (NOTE) No normal range established for this test Results should be evaluated in conjunction with serum values    Fluid Type-FALB CYTO PERI     Comment: Performed at [facility], [address] [place], [place] [address]  Protein, pleural or peritoneal fluid     Status: None   Collection Time: 07/07/19  4:00 PM  Result Value Ref Range   Total protein, fluid 3.6 g/dL    Comment: (NOTE) No normal range established for this test Results should be evaluated in conjunction with serum values    Fluid Type-FTP CYTO PERI     Comment: Performed at [facility], [address] [place], [place] [address]  Glucose, pleural or peritoneal fluid     Status: None   Collection Time: 07/07/19  4:00 PM  Result Value Ref Range   Glucose, Fluid 111 mg/dL    Comment: (NOTE) No normal range established for this test Results should be evaluated in conjunction with serum values    Fluid Type-FGLU CYTO PERI     Comment: Performed at [facility], [address] [place], [place] [address]  [facility], Body Fluid     Status: None   Collection Time: 07/07/19  4:00 PM  Result Value Ref Range   pH, Body Fluid 7.4 Not Estab.    Comment: (NOTE) This test was developed and its performance characteristics determined by [facility]. It has not been cleared or approved by the Food and Drug Administration. The reference interval(s) and other method performance specifications have not been established for this body fluid. The test result must be integrated into the clinical context for interpretation. Performed At: [facility] [place], [place] [id number] Rush Farmer MD QQ:[id number]    Source PERITONEAL     Comment: Performed at [facility], [address] [place],  [address]  Comprehensive metabolic panel     Status: Abnormal   Collection  Time: 07/08/19  6:15 AM  Result Value Ref Range   Sodium 138 135 - 145 mmol/L   Potassium 3.9 3.5 - 5.1 mmol/L   Chloride 97 (L) 98 - 111 mmol/L   CO2 31 22 - 32 mmol/L   Glucose, Bld 106 (H) 70 - 99 mg/dL    Comment: Glucose reference range applies only to samples taken after fasting for at least 8 hours.   BUN 12 8 - 23 mg/dL   Creatinine, Ser 0.96 0.44 - 1.00 mg/dL   Calcium 9.0 8.9 - 10.3 mg/dL   Total Protein 5.8 (L) 6.5 - 8.1 g/dL   Albumin 3.2 (L) 3.5 - 5.0  g/dL   AST 17 15 - 41 U/L   ALT 5 0 - 44 U/L   Alkaline Phosphatase 49 38 - 126 U/L   Total Bilirubin 0.8 0.3 - 1.2 mg/dL   GFR calc non Af Amer 51 (L) >60 mL/min   GFR calc Af Amer 59 (L) >60 mL/min   Anion gap 10 5 - 15    Comment: Performed at [facility], [address] [place], [place] [address]  Magnesium     Status: None   Collection Time: 07/08/19  6:15 AM  Result Value Ref Range   Magnesium 1.8 1.7 - 2.4 mg/dL    Comment: Performed at [facility], [address] [place], [place] [address]  Phosphorus     Status: None   Collection Time: 07/08/19  6:15 AM  Result Value Ref Range   Phosphorus 3.5 2.5 - 4.6 mg/dL    Comment: Performed at [facility], [address] [place], [place] [address]  CBC with Differential/Platelet     Status: Abnormal   Collection Time: 07/08/19  6:15 AM  Result Value Ref Range   WBC 5.7 4.0 - 10.5 K/uL   RBC 3.58 (L) 3.87 - 5.11 MIL/uL   Hemoglobin 10.6 (L) 12.0 - 15.0 g/dL   HCT 32.6 (L) 36.0 - 46.0 %   MCV 91.1 80.0 - 100.0 fL   MCH 29.6 26.0 - 34.0 pg   MCHC 32.5 30.0 - 36.0 g/dL   RDW 16.1 (H) 11.5 - 15.5 %   Platelets 168 150 - 400 K/uL   nRBC 0.0 0.0 - 0.2 %   Neutrophils Relative % 79 %   Neutro Abs 4.5 1.7 - 7.7 K/uL   Lymphocytes Relative 7 %   Lymphs Abs 0.4 (L) 0.7 - 4.0 K/uL   Monocytes Relative 10 %   Monocytes Absolute 0.6 0.1 - 1.0 K/uL   Eosinophils Relative 3 %   Eosinophils Absolute 0.2 0.0 - 0.5 K/uL   Basophils  Relative 0 %   Basophils Absolute 0.0 0.0 - 0.1 K/uL   Immature Granulocytes 1 %   Abs Immature Granulocytes 0.05 0.00 - 0.07 K/uL    Comment: Performed at [facility], [address] [place], [place] [address]  Comprehensive metabolic panel     Status: Abnormal   Collection Time: 07/09/19  5:49 AM  Result Value Ref Range   Sodium 139 135 - 145 mmol/L   Potassium 3.7 3.5 - 5.1 mmol/L   Chloride 97 (L) 98 - 111 mmol/L   CO2 31 22 - 32 mmol/L   Glucose, Bld 100 (H) 70 - 99 mg/dL    Comment: Glucose reference range applies only to samples taken after fasting for at least 8 hours.   BUN 12 8 - 23 mg/dL   Creatinine, Ser 0.75 0.44 - 1.00 mg/dL   Calcium 8.9 8.9 - 10.3 mg/dL   Total Protein 5.6 (L) 6.5 - 8.1 g/dL   Albumin 3.0 (L) 3.5 - 5.0 g/dL   AST 14 (L) 15 - 41 U/L   ALT <5 0 - 44 U/L   Alkaline Phosphatase 45 38 - 126 U/L   Total Bilirubin 0.8 0.3 - 1.2 mg/dL   GFR calc non Af Amer >60 >60 mL/min   GFR calc Af Amer >60 >60 mL/min   Anion gap 11 5 - 15    Comment: Performed at [facility], [address], [place], [place] [address]  Magnesium  Status: None   Collection Time: 07/09/19  5:49 AM  Result Value Ref Range   Magnesium 1.7 1.7 - 2.4 mg/dL    Comment: Performed at [facility], [address] [place], [place] [address]  Phosphorus     Status: None   Collection Time: 07/09/19  5:49 AM  Result Value Ref Range   Phosphorus 3.8 2.5 - 4.6 mg/dL    Comment: Performed at [facility], [address] [place], [place] [address]  CBC with Differential/Platelet     Status: Abnormal   Collection Time: 07/09/19  5:49 AM  Result Value Ref Range   WBC 5.2 4.0 - 10.5 K/uL   RBC 3.47 (L) 3.87 - 5.11 MIL/uL   Hemoglobin 10.0 (L) 12.0 - 15.0 g/dL   HCT 31.3 (L) 36.0 - 46.0 %   MCV 90.2 80.0 - 100.0 fL   MCH 28.8 26.0 - 34.0 pg   MCHC 31.9 30.0 - 36.0 g/dL   RDW 15.9 (H) 11.5 - 15.5 %   Platelets 176 150 - 400 K/uL   nRBC 0.0 0.0 -  0.2 %   Neutrophils Relative % 73 %   Neutro Abs 3.8 1.7 - 7.7 K/uL   Lymphocytes Relative 10 %   Lymphs Abs 0.5 (L) 0.7 - 4.0 K/uL   Monocytes Relative 12 %   Monocytes Absolute 0.6 0.1 - 1.0 K/uL   Eosinophils Relative 4 %   Eosinophils Absolute 0.2 0.0 - 0.5 K/uL   Basophils Relative 0 %   Basophils Absolute 0.0 0.0 - 0.1 K/uL   Immature Granulocytes 1 %   Abs Immature Granulocytes 0.04 0.00 - 0.07 K/uL    Comment: Performed at [facility], [address] [place], [place] [address]  SARS Coronavirus 2 by RT PCR (hospital order, performed in [place] hospital lab) Nasopharyngeal Nasopharyngeal Swab     Status: None   Collection Time: 07/10/19 11:42 AM   Specimen: Nasopharyngeal Swab  Result Value Ref Range   SARS Coronavirus 2 NEGATIVE NEGATIVE    Comment: (NOTE) SARS-CoV-2 target nucleic acids are NOT DETECTED. The SARS-CoV-2 RNA is generally detectable in upper and lower respiratory specimens during the acute phase of infection. The lowest concentration of SARS-CoV-2 viral copies this assay can detect is 250 copies / mL. A negative result does not preclude SARS-CoV-2 infection and should not be used as the sole basis for treatment or other patient management decisions.  A negative result may occur with improper specimen collection / handling, submission of specimen other than nasopharyngeal swab, presence of viral mutation(s) within the areas targeted by this assay, and inadequate number of viral copies (<250 copies / mL). A negative result must be combined with clinical observations, patient history, and epidemiological information. Fact Sheet for Patients:   StrictlyIdeas.no Fact Sheet for Healthcare Providers: BankingDealers.co.za This test is not yet approved or cleared  by the [place] FDA and has been authorized for detection and/or diagnosis of SARS-CoV-2 by FDA under an Emergency Use Authorization  (EUA).  This EUA will remain in effect (meaning this test can be used) for the duration of the COVID-19 declaration under Section 564(b)(1) of the Act, 21 U.S.C. section 360bbb-3(b)(1), unless the authorization is terminated or revoked sooner. Performed at [facility], [address] [place], [place] [address]     Radiology DG Chest 1 View  Result Date: 07/08/2019 CLINICAL DATA:  86 year old female with shortness of breath. Malignant recurrent right pleural effusion. EXAM: CHEST  1  VIEW COMPARISON:  Portable chest 07/07/2019. FINDINGS: Portable AP upright view at 0505 hours. Stable lung volumes and mediastinal contours. Calcified aortic atherosclerosis again noted. Visualized tracheal air column is within normal limits. No pneumothorax. Small residual right pleural effusion suspected. Patchy right lung base opacity not significantly changed. No areas of worsening ventilation. Stable visualized osseous structures. IMPRESSION: 1. Stable suspected small residual right pleural effusion. 2. No new acute cardiopulmonary abnormality. Electronically Signed   By: Genevie Ann M.D.   On: 07/08/2019 08:08   DG Chest 1 View  Result Date: 07/07/2019 CLINICAL DATA:  Malignant recurrent right pleural effusion and status post thoracentesis. EXAM: CHEST  1 VIEW COMPARISON:  CTA of the chest on 07/04/2019 FINDINGS: Stable heart size and tortuosity of the thoracic aorta. No significant residual right pleural fluid after thoracentesis. No pneumothorax. Basilar opacity on the right likely related to known lung mass. No pulmonary edema. IMPRESSION: No significant residual right pleural fluid after thoracentesis. No pneumothorax. Electronically Signed   By: Aletta Edouard M.D.   On: 07/07/2019 16:21   CT Angio Chest PE W/Cm &/Or Wo Cm  Result Date: 07/04/2019 CLINICAL DATA:  LEFT leg swelling.  Short of breath. EXAM: CT ANGIOGRAPHY CHEST WITH CONTRAST TECHNIQUE: Multidetector CT imaging of the chest was  performed using the standard protocol during bolus administration of intravenous contrast. Multiplanar CT image reconstructions and MIPs were obtained to evaluate the vascular anatomy. CONTRAST:  105mL OMNIPAQUE IOHEXOL 350 MG/ML SOLN COMPARISON:  Doppler ultrasound lower extremities 07/04/2019, CT angiogram chest 06/10/2019. FINDINGS: Cardiovascular: No filling defects within the pulmonary arteries to suggest acute pulmonary embolism. Coronary artery calcification and aortic atherosclerotic calcification. Mediastinum/Nodes: No axillary supraclavicular adenopathy. No mediastinal hilar adenopathy no pericardial effusion. Esophagus normal Lungs/Pleura: Large layering RIGHT pleural effusion similar to comparison CT. There is passive atelectasis of the RIGHT lower lobe. No pulmonary infarction identified. Atelectasis within the RIGHT middle lobe is also similar prior. Upper Abdomen: Limited view of the liver, kidneys, pancreas are unremarkable. Normal adrenal glands. Musculoskeletal: No aggressive osseous lesion. Review of the MIP images confirms the above findings. IMPRESSION: 1. No evidence acute pulmonary embolism. 2. Chronic large RIGHT pleural effusion with passive atelectasis of the RIGHT lung. Electronically Signed   By: Suzy Bouchard M.D.   On: 07/04/2019 14:35   PERIPHERAL VASCULAR CATHETERIZATION  Result Date: 07/06/2019 See op note  US Venous Img Lower Unilateral Left  Result Date: 07/04/2019 CLINICAL DATA:  Soft tissue swelling EXAM: LEFT LOWER EXTREMITY VENOUS DUPLEX ULTRASOUND TECHNIQUE: Gray-scale sonography with graded compression, as well as color Doppler and duplex ultrasound were performed to evaluate the left lower extremity deep venous system from the level of the common femoral vein and including the common femoral, femoral, profunda femoral, popliteal and calf veins including the posterior tibial, peroneal and gastrocnemius veins when visible. The superficial great saphenous vein was also  interrogated. Spectral Doppler was utilized to evaluate flow at rest and with distal augmentation maneuvers in the common femoral, femoral and popliteal veins. COMPARISON:  Jul 01, 2019 FINDINGS: Contralateral Common Femoral Vein: There is acute appearing thrombus in the left common femoral vein with loss of compressibility and Doppler signal. Common Femoral Vein: There is absence of flow at the left saphenofemoral junction with loss of apparent flow and compressibility. Saphenofemoral Junction: No evidence of thrombus. Normal compressibility and flow on color Doppler imaging. Profunda Femoral Vein: No evidence of thrombus. Normal compressibility and flow on color Doppler imaging. Femoral Vein: No evidence of thrombus. Normal compressibility,  respiratory phasicity and response to augmentation. Popliteal Vein: No evidence of thrombus. Normal compressibility, respiratory phasicity and response to augmentation. Calf Veins: No thrombus evident in the posterior tibial vein. There is incomplete obstruction of the left peroneal vein with diminished flow and diminished compressibility. Superficial Great Saphenous Vein: No evidence of thrombus. Normal compressibility. Venous Reflux:  None. Other Findings:  Patency of the proximal inferior vena cava noted. IMPRESSION: In comparison with recent study, there is now essentially complete occlusion of the left common femoral vein and left saphenofemoral junction regions. Thrombus is no longer appreciable in the left profunda femoral vein. There has been likely propagation of acute deep venous thrombosis more proximally into the left common femoral junction region. Note that this circumstance places patient at significant increased risk for pulmonary embolus development. Visualized proximal inferior vena cava appears patent without evident thrombus. Electronically Signed   By: Lowella Grip III M.D.   On: 07/04/2019 12:08   US Venous Img Lower Unilateral Left  Result Date:  07/01/2019 CLINICAL DATA:  Left leg pain and swelling. EXAM: LEFT LOWER EXTREMITY VENOUS DOPPLER ULTRASOUND TECHNIQUE: Gray-scale sonography with graded compression, as well as color Doppler and duplex ultrasound were performed to evaluate the lower extremity deep venous systems from the level of the common femoral vein and including the common femoral, femoral, profunda femoral, popliteal and calf veins including the posterior tibial, peroneal and gastrocnemius veins when visible. The superficial great saphenous vein was also interrogated. Spectral Doppler was utilized to evaluate flow at rest and with distal augmentation maneuvers in the common femoral, femoral and popliteal veins. COMPARISON:  07/02/2004 FINDINGS: Contralateral Common Femoral Vein: Respiratory phasicity is normal and symmetric with the symptomatic side. No evidence of thrombus. Normal compressibility. Common Femoral Vein: Nonocclusive thrombus. Saphenofemoral Junction: Nonocclusive thrombus. Profunda Femoral Vein: Nonocclusive thrombus. Femoral Vein: No evidence of thrombus. Normal compressibility, respiratory phasicity and response to augmentation. Popliteal Vein: No evidence of thrombus. Normal compressibility, respiratory phasicity and response to augmentation. Calf Veins: No evidence of thrombus. Normal compressibility and flow on color Doppler imaging. Superficial Great Saphenous Vein: No evidence of thrombus. Normal compressibility. Venous Reflux:  None. Other Findings:  None. IMPRESSION: Nonocclusive deep venous thrombosis involving the left common femoral vein, profunda femoral vein and saphenofemoral venous junction. Electronically Signed   By: Claudie Revering M.D.   On: 07/01/2019 17:54   US THORACENTESIS ASP PLEURAL SPACE W/IMG GUIDE  Result Date: 07/07/2019 CLINICAL DATA:  Malignant right pleural effusion. EXAM: ULTRASOUND GUIDED RIGHT THORACENTESIS COMPARISON:  CTA of the chest on 07/04/2019 PROCEDURE: An ultrasound guided  thoracentesis was thoroughly discussed with the patient and questions answered. The benefits, risks, alternatives and complications were also discussed. The patient understands and wishes to proceed with the procedure. Written consent was obtained. Ultrasound was performed to localize and mark an adequate pocket of fluid in the right chest. The area was then prepped and draped in the normal sterile fashion. 1% Lidocaine was used for local anesthesia. Under ultrasound guidance a 6 French Safe-T-Centesis catheter was introduced. Thoracentesis was performed. The catheter was removed and a dressing applied. COMPLICATIONS: None FINDINGS: A total of approximately 1.2 L of bloody fluid was removed. IMPRESSION: Successful ultrasound guided right thoracentesis yielding 1.2 L of pleural fluid. Electronically Signed   By: Aletta Edouard M.D.   On: 07/07/2019 16:16    Assessment/Plan Essential hypertension blood pressure control important in reducing the progression of atherosclerotic disease. On appropriate oral medications.  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 She has received radiation treatment and ultimately this may be a terminal problem, with a median survival of 1 to 2 years it sounds like. This may be the underlying cause of her pain but it is difficult to discern.  Had a thoracentesis last week which seems to have helped her symptoms somewhat.  It sounds like she has already reoccurring fluid.  I am certainly no expert, but wonder if a talc pleurodesis or some kind of other pleurodesis would be of benefit to avoid recurrent pleural effusions at this point.  They are going to discuss this with their oncologist.  AAA (abdominal aortic aneurysm) without rupture ([place]) Underwent extensive embolization of several branches feeding the type II endoleak.  We will plan on getting an aneurysm duplex in the next month or so for further  evaluation.  Lymphedema Tolerating anticoagulation and had minimal residual thrombus after thrombectomy a week ago.  At this point, I think it is safe to get her back in her lymphedema pump which would help her lower extremity swelling on both sides significantly.  DVT (deep venous thrombosis) (HCC) Status post thrombectomy for severe DVT with marked pain and swelling.  Much improved after this procedure.  Seems to be tolerating anticoagulation so far.  Reassess in about a month with a duplex.  Is at high risk for recurrent thrombosis due to her malignancy, but as long as she tolerates anticoagulation this risk is still relatively low.    Leotis Pain, MD  07/12/2019 4:54 PM    This note was created with Dragon medical transcription system.  Any errors from dictation are purely unintentional

## 2019-07-13 ENCOUNTER — Telehealth: Payer: Self-pay | Admitting: Adult Health Nurse Practitioner

## 2019-07-13 NOTE — Telephone Encounter (Signed)
Spoke with patent's daughter, Gail Nelson, regarding Palliative services and all questions were answered and she was in agreement with this.  I have scheduled an In-person Consult for 07/20/19 @ 3PM

## 2019-07-19 ENCOUNTER — Encounter: Payer: Self-pay | Admitting: Internal Medicine

## 2019-07-19 ENCOUNTER — Inpatient Hospital Stay (HOSPITAL_BASED_OUTPATIENT_CLINIC_OR_DEPARTMENT_OTHER): Payer: Medicare Other | Admitting: Hospice and Palliative Medicine

## 2019-07-19 ENCOUNTER — Telehealth: Payer: Self-pay | Admitting: *Deleted

## 2019-07-19 ENCOUNTER — Other Ambulatory Visit: Payer: Self-pay

## 2019-07-19 ENCOUNTER — Inpatient Hospital Stay: Payer: Medicare Other | Attending: Internal Medicine

## 2019-07-19 ENCOUNTER — Inpatient Hospital Stay: Payer: Medicare Other | Admitting: Internal Medicine

## 2019-07-19 VITALS — BP 123/64 | HR 91 | Temp 97.7°F | Resp 20 | Ht 62.0 in | Wt 188.8 lb

## 2019-07-19 DIAGNOSIS — Z79899 Other long term (current) drug therapy: Secondary | ICD-10-CM | POA: Diagnosis not present

## 2019-07-19 DIAGNOSIS — Z515 Encounter for palliative care: Secondary | ICD-10-CM

## 2019-07-19 DIAGNOSIS — J910 Malignant pleural effusion: Secondary | ICD-10-CM | POA: Diagnosis not present

## 2019-07-19 DIAGNOSIS — F17210 Nicotine dependence, cigarettes, uncomplicated: Secondary | ICD-10-CM | POA: Diagnosis not present

## 2019-07-19 DIAGNOSIS — J90 Pleural effusion, not elsewhere classified: Secondary | ICD-10-CM | POA: Diagnosis not present

## 2019-07-19 DIAGNOSIS — C3431 Malignant neoplasm of lower lobe, right bronchus or lung: Secondary | ICD-10-CM | POA: Diagnosis not present

## 2019-07-19 DIAGNOSIS — Z923 Personal history of irradiation: Secondary | ICD-10-CM | POA: Insufficient documentation

## 2019-07-19 DIAGNOSIS — R0602 Shortness of breath: Secondary | ICD-10-CM

## 2019-07-19 LAB — CBC WITH DIFFERENTIAL/PLATELET
Abs Immature Granulocytes: 0.05 10*3/uL (ref 0.00–0.07)
Basophils Absolute: 0 10*3/uL (ref 0.0–0.1)
Basophils Relative: 0 %
Eosinophils Absolute: 0.2 10*3/uL (ref 0.0–0.5)
Eosinophils Relative: 3 %
HCT: 33.9 % — ABNORMAL LOW (ref 36.0–46.0)
Hemoglobin: 10.8 g/dL — ABNORMAL LOW (ref 12.0–15.0)
Immature Granulocytes: 1 %
Lymphocytes Relative: 9 %
Lymphs Abs: 0.5 10*3/uL — ABNORMAL LOW (ref 0.7–4.0)
MCH: 29 pg (ref 26.0–34.0)
MCHC: 31.9 g/dL (ref 30.0–36.0)
MCV: 91.1 fL (ref 80.0–100.0)
Monocytes Absolute: 0.6 10*3/uL (ref 0.1–1.0)
Monocytes Relative: 10 %
Neutro Abs: 4.5 10*3/uL (ref 1.7–7.7)
Neutrophils Relative %: 77 %
Platelets: 247 10*3/uL (ref 150–400)
RBC: 3.72 MIL/uL — ABNORMAL LOW (ref 3.87–5.11)
RDW: 15.5 % (ref 11.5–15.5)
WBC: 5.8 10*3/uL (ref 4.0–10.5)
nRBC: 0 % (ref 0.0–0.2)

## 2019-07-19 LAB — BASIC METABOLIC PANEL
Anion gap: 11 (ref 5–15)
BUN: 14 mg/dL (ref 8–23)
CO2: 27 mmol/L (ref 22–32)
Calcium: 8.9 mg/dL (ref 8.9–10.3)
Chloride: 98 mmol/L (ref 98–111)
Creatinine, Ser: 0.98 mg/dL (ref 0.44–1.00)
GFR calc Af Amer: 58 mL/min — ABNORMAL LOW (ref >60)
GFR calc non Af Amer: 50 mL/min — ABNORMAL LOW (ref >60)
Glucose, Bld: 121 mg/dL — ABNORMAL HIGH (ref 70–99)
Potassium: 3.9 mmol/L (ref 3.5–5.1)
Sodium: 136 mmol/L (ref 135–145)
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
## 2019-07-19 NOTE — Telephone Encounter (Signed)
Patient's daughter provided with thoracentesis apt and pre-covid testing. She thanked me for calling her with these apts.

## 2019-07-19 NOTE — Assessment & Plan Note (Addendum)
#  Recurrent/metastatic/stage IV lung cancer [cytology pleural effusion; favor adeno]; Jun 09, 2019-improvement of the right lower lobe mass status post radiation; however new enlarged precarinal lymph node.  PD-L1 85%  #Patient is eligible for single agent Keytruda; however patient is high risk of complications-see below.  The understand treatments are not curative; but palliative only.  #Recurrent right pleural effusion- symptomatic-s/p thoracentesis; will order thoracentesis again.  # Right lower extremity bruising/ecchymosis acute pain-post aneurysm repair-improved.  # LEFT DVT-s/p thrombectomy; currently on anticoagulation improved.  #Debility-underlying stage IV lung cancer/repeat hospitalizations recommend physical therapy.  #Prognosis: Patient unfortunately has poor prognosis given her multiple comorbidities; poor performance status; patient family concerns for potential complication on therapy.  Patient/daughter interested in maintaining the quality of life; they are extremely concerned about the potential complications of any therapy.  They are interested in the supportive care.  I would recommend hospice/with continued decline in performance status.  Await meeting with Josh today.  # DISPOSITION: # follow up- TBD-Dr.B Cc; Dr.Saadat Khan/Dr.Dew.

## 2019-07-19 NOTE — Progress Notes (Signed)
Constipation x 4 days. Patient is taking dulcolax for constipation and increasing dietary intake of brocholi. She has tried miralax with no relief. Patient eating small meals throughout the day (3 ounces/meal -per daughter). C/o inspiratory chest discomfort and dyspnea. sats 91% Room air.
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
## 2019-07-20 ENCOUNTER — Other Ambulatory Visit: Payer: Medicare Other | Admitting: Adult Health Nurse Practitioner

## 2019-07-20 DIAGNOSIS — C3431 Malignant neoplasm of lower lobe, right bronchus or lung: Secondary | ICD-10-CM

## 2019-07-20 DIAGNOSIS — Z515 Encounter for palliative care: Secondary | ICD-10-CM | POA: Diagnosis not present

## 2019-07-20 LAB — THYROID PANEL WITH TSH
Free Thyroxine Index: 2.2 (ref 1.2–4.9)
T3 Uptake Ratio: 26 % (ref 24–39)
T4, Total: 8.4 ug/dL (ref 4.5–12.0)
TSH: 6.19 u[IU]/mL — ABNORMAL HIGH (ref 0.450–4.500)
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
## 2019-07-21 ENCOUNTER — Other Ambulatory Visit
Admission: RE | Admit: 2019-07-21 | Discharge: 2019-07-21 | Disposition: A | Discharge status: Home / Self Care | Payer: Medicare Other | Source: Ambulatory Visit | Attending: Internal Medicine | Admitting: Internal Medicine

## 2019-07-21 ENCOUNTER — Other Ambulatory Visit: Payer: Self-pay

## 2019-07-21 DIAGNOSIS — Z01812 Encounter for preprocedural laboratory examination: Secondary | ICD-10-CM | POA: Diagnosis not present

## 2019-07-21 DIAGNOSIS — J910 Malignant pleural effusion: Secondary | ICD-10-CM | POA: Diagnosis not present

## 2019-07-21 DIAGNOSIS — Z20822 Contact with and (suspected) exposure to covid-19: Secondary | ICD-10-CM | POA: Diagnosis not present

## 2019-07-21 DIAGNOSIS — I82812 Embolism and thrombosis of superficial veins of left lower extremities: Secondary | ICD-10-CM | POA: Diagnosis not present

## 2019-07-21 DIAGNOSIS — K5900 Constipation, unspecified: Secondary | ICD-10-CM | POA: Diagnosis not present

## 2019-07-21 DIAGNOSIS — Z923 Personal history of irradiation: Secondary | ICD-10-CM | POA: Diagnosis not present

## 2019-07-21 DIAGNOSIS — Z87891 Personal history of nicotine dependence: Secondary | ICD-10-CM | POA: Diagnosis not present

## 2019-07-21 DIAGNOSIS — Z853 Personal history of malignant neoplasm of breast: Secondary | ICD-10-CM | POA: Diagnosis not present

## 2019-07-21 DIAGNOSIS — I82412 Acute embolism and thrombosis of left femoral vein: Secondary | ICD-10-CM | POA: Diagnosis not present

## 2019-07-21 DIAGNOSIS — J449 Chronic obstructive pulmonary disease, unspecified: Secondary | ICD-10-CM | POA: Diagnosis not present

## 2019-07-21 DIAGNOSIS — L7622 Postprocedural hemorrhage and hematoma of skin and subcutaneous tissue following other procedure: Secondary | ICD-10-CM | POA: Diagnosis not present

## 2019-07-21 DIAGNOSIS — C3491 Malignant neoplasm of unspecified part of right bronchus or lung: Secondary | ICD-10-CM | POA: Diagnosis not present

## 2019-07-21 DIAGNOSIS — I10 Essential (primary) hypertension: Secondary | ICD-10-CM | POA: Diagnosis not present

## 2019-07-21 LAB — SARS CORONAVIRUS 2 (TAT 6-24 HRS): SARS Coronavirus 2: NEGATIVE

## 2019-07-22 ENCOUNTER — Ambulatory Visit
Admission: RE | Admit: 2019-07-22 | Discharge: 2019-07-22 | Disposition: A | Discharge status: Home / Self Care | Payer: Medicare Other | Source: Ambulatory Visit | Attending: Hospice and Palliative Medicine | Admitting: Hospice and Palliative Medicine

## 2019-07-22 ENCOUNTER — Ambulatory Visit
Admission: RE | Admit: 2019-07-22 | Discharge: 2019-07-22 | Disposition: A | Discharge status: Home / Self Care | Payer: Medicare Other | Source: Ambulatory Visit | Attending: Interventional Radiology | Admitting: Interventional Radiology

## 2019-07-22 ENCOUNTER — Telehealth: Payer: Self-pay | Admitting: *Deleted

## 2019-07-22 DIAGNOSIS — J9811 Atelectasis: Secondary | ICD-10-CM | POA: Diagnosis not present

## 2019-07-22 DIAGNOSIS — C3431 Malignant neoplasm of lower lobe, right bronchus or lung: Secondary | ICD-10-CM | POA: Diagnosis not present

## 2019-07-22 DIAGNOSIS — C801 Malignant (primary) neoplasm, unspecified: Secondary | ICD-10-CM | POA: Diagnosis not present

## 2019-07-22 DIAGNOSIS — Z87891 Personal history of nicotine dependence: Secondary | ICD-10-CM | POA: Diagnosis not present

## 2019-07-22 DIAGNOSIS — J449 Chronic obstructive pulmonary disease, unspecified: Secondary | ICD-10-CM | POA: Diagnosis not present

## 2019-07-22 DIAGNOSIS — I10 Essential (primary) hypertension: Secondary | ICD-10-CM | POA: Diagnosis not present

## 2019-07-22 DIAGNOSIS — I82812 Embolism and thrombosis of superficial veins of left lower extremities: Secondary | ICD-10-CM | POA: Diagnosis not present

## 2019-07-22 DIAGNOSIS — K5900 Constipation, unspecified: Secondary | ICD-10-CM | POA: Diagnosis not present

## 2019-07-22 DIAGNOSIS — J910 Malignant pleural effusion: Secondary | ICD-10-CM | POA: Diagnosis not present

## 2019-07-22 DIAGNOSIS — J90 Pleural effusion, not elsewhere classified: Secondary | ICD-10-CM

## 2019-07-22 DIAGNOSIS — Z923 Personal history of irradiation: Secondary | ICD-10-CM | POA: Diagnosis not present

## 2019-07-22 DIAGNOSIS — I82412 Acute embolism and thrombosis of left femoral vein: Secondary | ICD-10-CM | POA: Diagnosis not present

## 2019-07-22 DIAGNOSIS — C3491 Malignant neoplasm of unspecified part of right bronchus or lung: Secondary | ICD-10-CM | POA: Diagnosis not present

## 2019-07-22 DIAGNOSIS — Z853 Personal history of malignant neoplasm of breast: Secondary | ICD-10-CM | POA: Diagnosis not present

## 2019-07-22 DIAGNOSIS — L7622 Postprocedural hemorrhage and hematoma of skin and subcutaneous tissue following other procedure: Secondary | ICD-10-CM | POA: Diagnosis not present

## 2019-07-22 NOTE — Telephone Encounter (Signed)
Call returned to [facility] and had to leave orders on his voice mail for this patient.

## 2019-07-22 NOTE — Progress Notes (Signed)
Patient transported to specials post thoracentesis, to monitor 58min's after complaints of pain post  Procedure. CXR done which was viewed per Dr Benjiman Core changes. Daughter here with patient as vitals remain stable. OK given for discharge after Dr Annamaria Boots here to reassess patient.

## 2019-07-22 NOTE — Telephone Encounter (Signed)
Gail Nelson. Ok to approve PT. [name] has been in communication with [facility] to set up home PT.

## 2019-07-22 NOTE — Telephone Encounter (Signed)
Merry Proud with [facility] called asking for orders for PT 1wk1, 2wk3. Please advise if approved for this

## 2019-07-22 NOTE — Procedures (Signed)
Interventional Radiology Procedure Note  Procedure: [place] RIGHT THORA  Complications: None  Estimated Blood Loss: MIN  Findings: 1 L BLOODY PLEURAL FLD REMOVED

## 2019-07-23 DIAGNOSIS — J449 Chronic obstructive pulmonary disease, unspecified: Secondary | ICD-10-CM | POA: Diagnosis not present

## 2019-07-25 ENCOUNTER — Telehealth: Payer: Self-pay | Admitting: *Deleted

## 2019-07-25 NOTE — Telephone Encounter (Signed)
Spoke with Sherlynn Stalls with [facility]. Verbal orders given to proceed with Occupational therapy as directed.

## 2019-07-25 NOTE — Telephone Encounter (Signed)
Esther with [facility] occupational therapy called asking for order approval for occupational therapy 1wk1, 2wk2. Please advise

## 2019-07-26 ENCOUNTER — Telehealth: Payer: Self-pay

## 2019-07-26 DIAGNOSIS — C3491 Malignant neoplasm of unspecified part of right bronchus or lung: Secondary | ICD-10-CM | POA: Diagnosis not present

## 2019-07-26 DIAGNOSIS — I10 Essential (primary) hypertension: Secondary | ICD-10-CM | POA: Diagnosis not present

## 2019-07-26 DIAGNOSIS — Z853 Personal history of malignant neoplasm of breast: Secondary | ICD-10-CM | POA: Diagnosis not present

## 2019-07-26 DIAGNOSIS — L7622 Postprocedural hemorrhage and hematoma of skin and subcutaneous tissue following other procedure: Secondary | ICD-10-CM | POA: Diagnosis not present

## 2019-07-26 DIAGNOSIS — Z87891 Personal history of nicotine dependence: Secondary | ICD-10-CM | POA: Diagnosis not present

## 2019-07-26 DIAGNOSIS — J910 Malignant pleural effusion: Secondary | ICD-10-CM | POA: Diagnosis not present

## 2019-07-26 DIAGNOSIS — Z923 Personal history of irradiation: Secondary | ICD-10-CM | POA: Diagnosis not present

## 2019-07-26 DIAGNOSIS — I82412 Acute embolism and thrombosis of left femoral vein: Secondary | ICD-10-CM | POA: Diagnosis not present

## 2019-07-26 DIAGNOSIS — I82812 Embolism and thrombosis of superficial veins of left lower extremities: Secondary | ICD-10-CM | POA: Diagnosis not present

## 2019-07-26 DIAGNOSIS — J449 Chronic obstructive pulmonary disease, unspecified: Secondary | ICD-10-CM | POA: Diagnosis not present

## 2019-07-26 DIAGNOSIS — K5900 Constipation, unspecified: Secondary | ICD-10-CM | POA: Diagnosis not present

## 2019-07-26 NOTE — Telephone Encounter (Signed)
Confirmation of verbal order for home medical equipment signed and faxed back to [place] [facility]. Placed in [employer].

## 2019-07-27 ENCOUNTER — Telehealth: Payer: Self-pay

## 2019-07-27 DIAGNOSIS — L7622 Postprocedural hemorrhage and hematoma of skin and subcutaneous tissue following other procedure: Secondary | ICD-10-CM | POA: Diagnosis not present

## 2019-07-27 DIAGNOSIS — K5900 Constipation, unspecified: Secondary | ICD-10-CM | POA: Diagnosis not present

## 2019-07-27 DIAGNOSIS — I82812 Embolism and thrombosis of superficial veins of left lower extremities: Secondary | ICD-10-CM | POA: Diagnosis not present

## 2019-07-27 DIAGNOSIS — J449 Chronic obstructive pulmonary disease, unspecified: Secondary | ICD-10-CM | POA: Diagnosis not present

## 2019-07-27 DIAGNOSIS — Z853 Personal history of malignant neoplasm of breast: Secondary | ICD-10-CM | POA: Diagnosis not present

## 2019-07-27 DIAGNOSIS — I10 Essential (primary) hypertension: Secondary | ICD-10-CM | POA: Diagnosis not present

## 2019-07-27 DIAGNOSIS — Z923 Personal history of irradiation: Secondary | ICD-10-CM | POA: Diagnosis not present

## 2019-07-27 DIAGNOSIS — C3491 Malignant neoplasm of unspecified part of right bronchus or lung: Secondary | ICD-10-CM | POA: Diagnosis not present

## 2019-07-27 DIAGNOSIS — Z87891 Personal history of nicotine dependence: Secondary | ICD-10-CM | POA: Diagnosis not present

## 2019-07-27 DIAGNOSIS — J910 Malignant pleural effusion: Secondary | ICD-10-CM | POA: Diagnosis not present

## 2019-07-27 DIAGNOSIS — I82412 Acute embolism and thrombosis of left femoral vein: Secondary | ICD-10-CM | POA: Diagnosis not present

## 2019-07-27 NOTE — Telephone Encounter (Signed)
Pt daughter notified and samples ready for pickup at front for linzess

## 2019-07-27 NOTE — Telephone Encounter (Signed)
Has she tried bottle of magnesium citrate? This is over the counter and will help relieve acute constipation. Use as directed  per bottle instructions. We can also give her few samples of linzess which is taken daily to help treat chronic constipation. If it works for her, we can send in prescription. I think we just have the 18mcg. We may also consider doing a higher dose if 72 doesn't work.

## 2019-07-28 ENCOUNTER — Telehealth: Payer: Self-pay

## 2019-07-28 DIAGNOSIS — K5900 Constipation, unspecified: Secondary | ICD-10-CM | POA: Diagnosis not present

## 2019-07-28 DIAGNOSIS — Z87891 Personal history of nicotine dependence: Secondary | ICD-10-CM | POA: Diagnosis not present

## 2019-07-28 DIAGNOSIS — Z923 Personal history of irradiation: Secondary | ICD-10-CM | POA: Diagnosis not present

## 2019-07-28 DIAGNOSIS — Z853 Personal history of malignant neoplasm of breast: Secondary | ICD-10-CM | POA: Diagnosis not present

## 2019-07-28 DIAGNOSIS — C3491 Malignant neoplasm of unspecified part of right bronchus or lung: Secondary | ICD-10-CM | POA: Diagnosis not present

## 2019-07-28 DIAGNOSIS — I10 Essential (primary) hypertension: Secondary | ICD-10-CM | POA: Diagnosis not present

## 2019-07-28 DIAGNOSIS — J910 Malignant pleural effusion: Secondary | ICD-10-CM | POA: Diagnosis not present

## 2019-07-28 DIAGNOSIS — I82812 Embolism and thrombosis of superficial veins of left lower extremities: Secondary | ICD-10-CM | POA: Diagnosis not present

## 2019-07-28 DIAGNOSIS — I82412 Acute embolism and thrombosis of left femoral vein: Secondary | ICD-10-CM | POA: Diagnosis not present

## 2019-07-28 DIAGNOSIS — J449 Chronic obstructive pulmonary disease, unspecified: Secondary | ICD-10-CM | POA: Diagnosis not present

## 2019-07-28 DIAGNOSIS — L7622 Postprocedural hemorrhage and hematoma of skin and subcutaneous tissue following other procedure: Secondary | ICD-10-CM | POA: Diagnosis not present

## 2019-07-28 NOTE — Telephone Encounter (Signed)
Nutrition  Referral received from [place], NP  Called daughter, Mechele Claude, number listed in chart to help answer questions or nutrition concerns.  Daughter requesting RD set up time to come to patient's home and meet with patient and daughter as patient does not hear well over the phone.  RD currently just sees patient in the clinic on Monday and Thursday.  Daughter says that they do not want to come to clinic at this time to set up appointment.  Daughter took RD's number down and will reach back out to RD if needed in the future.  Appreciative of call.  Gail Nelson B. Gail Nelson, [place], [place] Registered Dietitian [phone] (pager)

## 2019-07-29 DIAGNOSIS — L7622 Postprocedural hemorrhage and hematoma of skin and subcutaneous tissue following other procedure: Secondary | ICD-10-CM | POA: Diagnosis not present

## 2019-07-29 DIAGNOSIS — I10 Essential (primary) hypertension: Secondary | ICD-10-CM | POA: Diagnosis not present

## 2019-07-29 DIAGNOSIS — Z853 Personal history of malignant neoplasm of breast: Secondary | ICD-10-CM | POA: Diagnosis not present

## 2019-07-29 DIAGNOSIS — C3491 Malignant neoplasm of unspecified part of right bronchus or lung: Secondary | ICD-10-CM | POA: Diagnosis not present

## 2019-07-29 DIAGNOSIS — I82412 Acute embolism and thrombosis of left femoral vein: Secondary | ICD-10-CM | POA: Diagnosis not present

## 2019-07-29 DIAGNOSIS — I82812 Embolism and thrombosis of superficial veins of left lower extremities: Secondary | ICD-10-CM | POA: Diagnosis not present

## 2019-07-29 DIAGNOSIS — K5900 Constipation, unspecified: Secondary | ICD-10-CM | POA: Diagnosis not present

## 2019-07-29 DIAGNOSIS — J910 Malignant pleural effusion: Secondary | ICD-10-CM | POA: Diagnosis not present

## 2019-07-29 DIAGNOSIS — Z923 Personal history of irradiation: Secondary | ICD-10-CM | POA: Diagnosis not present

## 2019-07-29 DIAGNOSIS — Z87891 Personal history of nicotine dependence: Secondary | ICD-10-CM | POA: Diagnosis not present

## 2019-07-29 DIAGNOSIS — J449 Chronic obstructive pulmonary disease, unspecified: Secondary | ICD-10-CM | POA: Diagnosis not present

## 2019-08-01 ENCOUNTER — Other Ambulatory Visit: Payer: Self-pay | Admitting: Hospice and Palliative Medicine

## 2019-08-01 DIAGNOSIS — I10 Essential (primary) hypertension: Secondary | ICD-10-CM | POA: Diagnosis not present

## 2019-08-01 DIAGNOSIS — C3491 Malignant neoplasm of unspecified part of right bronchus or lung: Secondary | ICD-10-CM | POA: Diagnosis not present

## 2019-08-01 DIAGNOSIS — I82812 Embolism and thrombosis of superficial veins of left lower extremities: Secondary | ICD-10-CM | POA: Diagnosis not present

## 2019-08-01 DIAGNOSIS — Z853 Personal history of malignant neoplasm of breast: Secondary | ICD-10-CM | POA: Diagnosis not present

## 2019-08-01 DIAGNOSIS — Z923 Personal history of irradiation: Secondary | ICD-10-CM | POA: Diagnosis not present

## 2019-08-01 DIAGNOSIS — J910 Malignant pleural effusion: Secondary | ICD-10-CM | POA: Diagnosis not present

## 2019-08-01 DIAGNOSIS — Z87891 Personal history of nicotine dependence: Secondary | ICD-10-CM | POA: Diagnosis not present

## 2019-08-01 DIAGNOSIS — I82412 Acute embolism and thrombosis of left femoral vein: Secondary | ICD-10-CM | POA: Diagnosis not present

## 2019-08-01 DIAGNOSIS — K5900 Constipation, unspecified: Secondary | ICD-10-CM | POA: Diagnosis not present

## 2019-08-01 DIAGNOSIS — L7622 Postprocedural hemorrhage and hematoma of skin and subcutaneous tissue following other procedure: Secondary | ICD-10-CM | POA: Diagnosis not present

## 2019-08-01 DIAGNOSIS — J449 Chronic obstructive pulmonary disease, unspecified: Secondary | ICD-10-CM | POA: Diagnosis not present

## 2019-08-01 MED ORDER — HYDROCODONE-ACETAMINOPHEN 5-325 MG PO TABS: 1.0000 | ORAL_TABLET | Freq: Four times a day (QID) | ORAL | 0 refills | Status: DC | PRN

## 2019-08-01 NOTE — Progress Notes (Signed)
Daughter called triage to report that patient has a sacral wound and requested home health management. I called and gave a verbal order to [facility] to add SN and utilize home health wound protocol. I also refilled Norco per family request. #60 tablets. PDMP reviewed.

## 2019-08-02 ENCOUNTER — Other Ambulatory Visit: Payer: Self-pay | Admitting: Oncology

## 2019-08-02 ENCOUNTER — Ambulatory Visit
Admission: RE | Admit: 2019-08-02 | Discharge: 2019-08-02 | Disposition: A | Discharge status: Home / Self Care | Payer: Medicare Other | Source: Ambulatory Visit | Attending: Oncology | Admitting: Oncology

## 2019-08-02 ENCOUNTER — Ambulatory Visit
Admission: RE | Admit: 2019-08-02 | Discharge: 2019-08-02 | Disposition: A | Discharge status: Home / Self Care | Payer: Medicare Other | Attending: Oncology | Admitting: Oncology

## 2019-08-02 ENCOUNTER — Telehealth: Payer: Self-pay | Admitting: *Deleted

## 2019-08-02 ENCOUNTER — Inpatient Hospital Stay (HOSPITAL_BASED_OUTPATIENT_CLINIC_OR_DEPARTMENT_OTHER): Payer: Medicare Other | Admitting: Hospice and Palliative Medicine

## 2019-08-02 ENCOUNTER — Other Ambulatory Visit: Payer: Self-pay

## 2019-08-02 VITALS — BP 132/73 | HR 93 | Temp 98.7°F | Resp 20

## 2019-08-02 DIAGNOSIS — G893 Neoplasm related pain (acute) (chronic): Secondary | ICD-10-CM

## 2019-08-02 DIAGNOSIS — R109 Unspecified abdominal pain: Secondary | ICD-10-CM | POA: Diagnosis not present

## 2019-08-02 DIAGNOSIS — R1084 Generalized abdominal pain: Secondary | ICD-10-CM | POA: Diagnosis not present

## 2019-08-02 DIAGNOSIS — J910 Malignant pleural effusion: Secondary | ICD-10-CM | POA: Diagnosis not present

## 2019-08-02 DIAGNOSIS — Z79899 Other long term (current) drug therapy: Secondary | ICD-10-CM | POA: Diagnosis not present

## 2019-08-02 DIAGNOSIS — C3431 Malignant neoplasm of lower lobe, right bronchus or lung: Secondary | ICD-10-CM

## 2019-08-02 DIAGNOSIS — Z923 Personal history of irradiation: Secondary | ICD-10-CM | POA: Diagnosis not present

## 2019-08-02 DIAGNOSIS — J90 Pleural effusion, not elsewhere classified: Secondary | ICD-10-CM | POA: Diagnosis not present

## 2019-08-02 MED ORDER — GABAPENTIN 100 MG PO CAPS
100.0000 mg | ORAL_CAPSULE | Freq: Every day | ORAL | 2 refills | Status: AC
Start: 2019-08-02 — End: ?
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
## 2019-08-02 NOTE — Telephone Encounter (Signed)
Spoke with Sonia Baller, NP. Will order an abdominal xray and have patient see either she or Josh today.  Per daughter, Patient "under constant pain."  Bowel movements are moving normally.  Daughter unable to bring the patient until this afternoon. She will get off of work and will bring her mom to the xray department by 1 pm. Apt given to see Josh, NP at 2 pm.

## 2019-08-02 NOTE — Telephone Encounter (Addendum)
Esther with [facility] called reporting that patient is having severe gas causing bloating and that patient is having associated pain 10/10 due to this and is asking what patient can take for it. She does report that her bowels are moving normally. Call daughter with response.Please advise

## 2019-08-03 ENCOUNTER — Other Ambulatory Visit
Admission: RE | Admit: 2019-08-03 | Discharge: 2019-08-03 | Disposition: A | Discharge status: Home / Self Care | Payer: Medicare Other | Source: Ambulatory Visit | Attending: Internal Medicine | Admitting: Internal Medicine

## 2019-08-03 ENCOUNTER — Telehealth: Payer: Self-pay | Admitting: Licensed Clinical Social Worker

## 2019-08-03 DIAGNOSIS — Z01812 Encounter for preprocedural laboratory examination: Secondary | ICD-10-CM | POA: Diagnosis not present

## 2019-08-03 DIAGNOSIS — Z20822 Contact with and (suspected) exposure to covid-19: Secondary | ICD-10-CM | POA: Diagnosis not present

## 2019-08-03 LAB — SARS CORONAVIRUS 2 (TAT 6-24 HRS): SARS Coronavirus 2: NEGATIVE

## 2019-08-03 NOTE — Telephone Encounter (Signed)
Spoke to patients daughter to let her know [facility]. Daughter states she will have her there today

## 2019-08-04 ENCOUNTER — Other Ambulatory Visit: Payer: Self-pay

## 2019-08-04 ENCOUNTER — Ambulatory Visit
Admission: RE | Admit: 2019-08-04 | Discharge: 2019-08-04 | Disposition: A | Discharge status: Home / Self Care | Payer: Medicare Other | Source: Ambulatory Visit | Attending: Hospice and Palliative Medicine | Admitting: Hospice and Palliative Medicine

## 2019-08-04 ENCOUNTER — Ambulatory Visit
Admission: RE | Admit: 2019-08-04 | Discharge: 2019-08-04 | Disposition: A | Discharge status: Home / Self Care | Payer: Medicare Other | Source: Ambulatory Visit | Attending: Physician Assistant | Admitting: Physician Assistant

## 2019-08-04 DIAGNOSIS — Z85118 Personal history of other malignant neoplasm of bronchus and lung: Secondary | ICD-10-CM | POA: Diagnosis not present

## 2019-08-04 DIAGNOSIS — C50912 Malignant neoplasm of unspecified site of left female breast: Secondary | ICD-10-CM | POA: Diagnosis not present

## 2019-08-04 DIAGNOSIS — J90 Pleural effusion, not elsewhere classified: Secondary | ICD-10-CM | POA: Diagnosis not present

## 2019-08-04 DIAGNOSIS — J910 Malignant pleural effusion: Secondary | ICD-10-CM | POA: Diagnosis not present

## 2019-08-04 DIAGNOSIS — Z9012 Acquired absence of left breast and nipple: Secondary | ICD-10-CM | POA: Insufficient documentation

## 2019-08-04 DIAGNOSIS — Z853 Personal history of malignant neoplasm of breast: Secondary | ICD-10-CM | POA: Diagnosis not present

## 2019-08-04 DIAGNOSIS — C3431 Malignant neoplasm of lower lobe, right bronchus or lung: Secondary | ICD-10-CM

## 2019-08-04 NOTE — Procedures (Signed)
PROCEDURE SUMMARY:  Successful image-guided right thoracentesis. Yielded 1.0 liters of serosanguineous fluid. Patient tolerated procedure well. EBL: Zero No immediate complications.  Specimen was not sent for labs. Post procedure CXR shows no pneumothorax.  Please see imaging section of Epic for full dictation.  Joaquim Nam PA-C 08/04/2019 1:34 PM

## 2019-08-05 DIAGNOSIS — K5900 Constipation, unspecified: Secondary | ICD-10-CM | POA: Diagnosis not present

## 2019-08-05 DIAGNOSIS — J449 Chronic obstructive pulmonary disease, unspecified: Secondary | ICD-10-CM | POA: Diagnosis not present

## 2019-08-05 DIAGNOSIS — I82812 Embolism and thrombosis of superficial veins of left lower extremities: Secondary | ICD-10-CM | POA: Diagnosis not present

## 2019-08-05 DIAGNOSIS — I10 Essential (primary) hypertension: Secondary | ICD-10-CM | POA: Diagnosis not present

## 2019-08-05 DIAGNOSIS — Z923 Personal history of irradiation: Secondary | ICD-10-CM | POA: Diagnosis not present

## 2019-08-05 DIAGNOSIS — Z87891 Personal history of nicotine dependence: Secondary | ICD-10-CM | POA: Diagnosis not present

## 2019-08-05 DIAGNOSIS — C3491 Malignant neoplasm of unspecified part of right bronchus or lung: Secondary | ICD-10-CM | POA: Diagnosis not present

## 2019-08-05 DIAGNOSIS — I82412 Acute embolism and thrombosis of left femoral vein: Secondary | ICD-10-CM | POA: Diagnosis not present

## 2019-08-05 DIAGNOSIS — J910 Malignant pleural effusion: Secondary | ICD-10-CM | POA: Diagnosis not present

## 2019-08-05 DIAGNOSIS — L7622 Postprocedural hemorrhage and hematoma of skin and subcutaneous tissue following other procedure: Secondary | ICD-10-CM | POA: Diagnosis not present

## 2019-08-05 DIAGNOSIS — Z853 Personal history of malignant neoplasm of breast: Secondary | ICD-10-CM | POA: Diagnosis not present

## 2019-08-07 DIAGNOSIS — L7622 Postprocedural hemorrhage and hematoma of skin and subcutaneous tissue following other procedure: Secondary | ICD-10-CM | POA: Diagnosis not present

## 2019-08-07 DIAGNOSIS — J449 Chronic obstructive pulmonary disease, unspecified: Secondary | ICD-10-CM | POA: Diagnosis not present

## 2019-08-07 DIAGNOSIS — I82812 Embolism and thrombosis of superficial veins of left lower extremities: Secondary | ICD-10-CM | POA: Diagnosis not present

## 2019-08-07 DIAGNOSIS — C3491 Malignant neoplasm of unspecified part of right bronchus or lung: Secondary | ICD-10-CM | POA: Diagnosis not present

## 2019-08-07 DIAGNOSIS — Z853 Personal history of malignant neoplasm of breast: Secondary | ICD-10-CM | POA: Diagnosis not present

## 2019-08-07 DIAGNOSIS — Z87891 Personal history of nicotine dependence: Secondary | ICD-10-CM | POA: Diagnosis not present

## 2019-08-07 DIAGNOSIS — K5900 Constipation, unspecified: Secondary | ICD-10-CM | POA: Diagnosis not present

## 2019-08-07 DIAGNOSIS — J910 Malignant pleural effusion: Secondary | ICD-10-CM | POA: Diagnosis not present

## 2019-08-07 DIAGNOSIS — Z923 Personal history of irradiation: Secondary | ICD-10-CM | POA: Diagnosis not present

## 2019-08-07 DIAGNOSIS — I82412 Acute embolism and thrombosis of left femoral vein: Secondary | ICD-10-CM | POA: Diagnosis not present

## 2019-08-07 DIAGNOSIS — I10 Essential (primary) hypertension: Secondary | ICD-10-CM | POA: Diagnosis not present

## 2019-08-09 DIAGNOSIS — K5900 Constipation, unspecified: Secondary | ICD-10-CM | POA: Diagnosis not present

## 2019-08-09 DIAGNOSIS — I82812 Embolism and thrombosis of superficial veins of left lower extremities: Secondary | ICD-10-CM | POA: Diagnosis not present

## 2019-08-09 DIAGNOSIS — C3491 Malignant neoplasm of unspecified part of right bronchus or lung: Secondary | ICD-10-CM | POA: Diagnosis not present

## 2019-08-09 DIAGNOSIS — J449 Chronic obstructive pulmonary disease, unspecified: Secondary | ICD-10-CM | POA: Diagnosis not present

## 2019-08-09 DIAGNOSIS — L7622 Postprocedural hemorrhage and hematoma of skin and subcutaneous tissue following other procedure: Secondary | ICD-10-CM | POA: Diagnosis not present

## 2019-08-09 DIAGNOSIS — Z923 Personal history of irradiation: Secondary | ICD-10-CM | POA: Diagnosis not present

## 2019-08-09 DIAGNOSIS — Z853 Personal history of malignant neoplasm of breast: Secondary | ICD-10-CM | POA: Diagnosis not present

## 2019-08-09 DIAGNOSIS — Z87891 Personal history of nicotine dependence: Secondary | ICD-10-CM | POA: Diagnosis not present

## 2019-08-09 DIAGNOSIS — I82412 Acute embolism and thrombosis of left femoral vein: Secondary | ICD-10-CM | POA: Diagnosis not present

## 2019-08-09 DIAGNOSIS — J910 Malignant pleural effusion: Secondary | ICD-10-CM | POA: Diagnosis not present

## 2019-08-09 DIAGNOSIS — I10 Essential (primary) hypertension: Secondary | ICD-10-CM | POA: Diagnosis not present

## 2019-08-10 ENCOUNTER — Telehealth (INDEPENDENT_AMBULATORY_CARE_PROVIDER_SITE_OTHER): Payer: Self-pay

## 2019-08-10 ENCOUNTER — Telehealth: Payer: Self-pay | Admitting: *Deleted

## 2019-08-10 ENCOUNTER — Other Ambulatory Visit (INDEPENDENT_AMBULATORY_CARE_PROVIDER_SITE_OTHER): Payer: Self-pay | Admitting: Nurse Practitioner

## 2019-08-10 DIAGNOSIS — L7622 Postprocedural hemorrhage and hematoma of skin and subcutaneous tissue following other procedure: Secondary | ICD-10-CM | POA: Diagnosis not present

## 2019-08-10 DIAGNOSIS — I82412 Acute embolism and thrombosis of left femoral vein: Secondary | ICD-10-CM | POA: Diagnosis not present

## 2019-08-10 DIAGNOSIS — I10 Essential (primary) hypertension: Secondary | ICD-10-CM | POA: Diagnosis not present

## 2019-08-10 DIAGNOSIS — J910 Malignant pleural effusion: Secondary | ICD-10-CM | POA: Diagnosis not present

## 2019-08-10 DIAGNOSIS — Z923 Personal history of irradiation: Secondary | ICD-10-CM | POA: Diagnosis not present

## 2019-08-10 DIAGNOSIS — Z853 Personal history of malignant neoplasm of breast: Secondary | ICD-10-CM | POA: Diagnosis not present

## 2019-08-10 DIAGNOSIS — Z87891 Personal history of nicotine dependence: Secondary | ICD-10-CM | POA: Diagnosis not present

## 2019-08-10 DIAGNOSIS — J449 Chronic obstructive pulmonary disease, unspecified: Secondary | ICD-10-CM | POA: Diagnosis not present

## 2019-08-10 DIAGNOSIS — K5900 Constipation, unspecified: Secondary | ICD-10-CM | POA: Diagnosis not present

## 2019-08-10 DIAGNOSIS — C3491 Malignant neoplasm of unspecified part of right bronchus or lung: Secondary | ICD-10-CM | POA: Diagnosis not present

## 2019-08-10 DIAGNOSIS — I82812 Embolism and thrombosis of superficial veins of left lower extremities: Secondary | ICD-10-CM | POA: Diagnosis not present

## 2019-08-10 MED ORDER — APIXABAN 5 MG PO TABS: 5.0000 mg | ORAL_TABLET | Freq: Two times a day (BID) | ORAL | 6 refills | Status: AC

## 2019-08-10 NOTE — Telephone Encounter (Signed)
Gail Nelson with me.  [place]

## 2019-08-10 NOTE — Telephone Encounter (Signed)
VERBAL ORDER to [facility] for approval of 2 week 4

## 2019-08-10 NOTE — Telephone Encounter (Signed)
Gail Nelson with [facility] called asking for continuation of PT orders for 2 wk 4. Please advise

## 2019-08-10 NOTE — Telephone Encounter (Signed)
Refill sent will bring paperwork

## 2019-08-10 NOTE — Telephone Encounter (Signed)
pts daughter came by an wants a refill on he moms eliquis she also wanted to know if we have the forms to lower her moms co-pay for the medication.

## 2019-08-11 ENCOUNTER — Telehealth: Payer: Self-pay

## 2019-08-11 ENCOUNTER — Other Ambulatory Visit (INDEPENDENT_AMBULATORY_CARE_PROVIDER_SITE_OTHER): Payer: Self-pay | Admitting: Nurse Practitioner

## 2019-08-11 NOTE — Telephone Encounter (Signed)
Called lmom informing patient of appointment on 08/15/2019. klh

## 2019-08-11 NOTE — Telephone Encounter (Signed)
I called the pt and spoke to her daughter I made her aware that the Rx was called and that the paperwork was going to be brought up, she said that she would stop by after work an pick it up and fill it out.

## 2019-08-15 ENCOUNTER — Ambulatory Visit: Payer: Medicare Other | Admitting: Internal Medicine

## 2019-08-15 ENCOUNTER — Encounter: Payer: Self-pay | Admitting: Internal Medicine

## 2019-08-15 ENCOUNTER — Other Ambulatory Visit: Payer: Self-pay

## 2019-08-15 VITALS — BP 154/84 | HR 98 | Temp 97.3°F | Resp 16 | Ht 62.0 in | Wt 185.4 lb

## 2019-08-15 DIAGNOSIS — Z853 Personal history of malignant neoplasm of breast: Secondary | ICD-10-CM | POA: Diagnosis not present

## 2019-08-15 DIAGNOSIS — Z87891 Personal history of nicotine dependence: Secondary | ICD-10-CM | POA: Diagnosis not present

## 2019-08-15 DIAGNOSIS — Z923 Personal history of irradiation: Secondary | ICD-10-CM | POA: Diagnosis not present

## 2019-08-15 DIAGNOSIS — C3491 Malignant neoplasm of unspecified part of right bronchus or lung: Secondary | ICD-10-CM

## 2019-08-15 DIAGNOSIS — J910 Malignant pleural effusion: Secondary | ICD-10-CM | POA: Diagnosis not present

## 2019-08-15 DIAGNOSIS — I10 Essential (primary) hypertension: Secondary | ICD-10-CM | POA: Diagnosis not present

## 2019-08-15 DIAGNOSIS — G893 Neoplasm related pain (acute) (chronic): Secondary | ICD-10-CM | POA: Diagnosis not present

## 2019-08-15 DIAGNOSIS — R0602 Shortness of breath: Secondary | ICD-10-CM

## 2019-08-15 DIAGNOSIS — J449 Chronic obstructive pulmonary disease, unspecified: Secondary | ICD-10-CM

## 2019-08-15 DIAGNOSIS — K5900 Constipation, unspecified: Secondary | ICD-10-CM | POA: Diagnosis not present

## 2019-08-15 DIAGNOSIS — L7622 Postprocedural hemorrhage and hematoma of skin and subcutaneous tissue following other procedure: Secondary | ICD-10-CM | POA: Diagnosis not present

## 2019-08-15 DIAGNOSIS — I82812 Embolism and thrombosis of superficial veins of left lower extremities: Secondary | ICD-10-CM | POA: Diagnosis not present

## 2019-08-15 DIAGNOSIS — I82412 Acute embolism and thrombosis of left femoral vein: Secondary | ICD-10-CM | POA: Diagnosis not present

## 2019-08-15 MED ORDER — MORPHINE SULFATE 15 MG PO TABS: 15.0000 mg | ORAL_TABLET | ORAL | 0 refills | Status: AC | PRN

## 2019-08-15 NOTE — Progress Notes (Signed)
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  result does not preclude SARS-CoV-2 infection and should not be used as the sole basis for treatment or other patient management decisions.  A negative result may occur with improper specimen collection / handling, submission of specimen other than nasopharyngeal swab, presence of viral mutation(s) within the areas targeted by this assay, and inadequate number of viral copies (<250 copies / mL). A negative result must be combined with clinical observations, patient history, and epidemiological information. Fact Sheet for Patients:   StrictlyIdeas.no Fact Sheet for Healthcare Providers: BankingDealers.co.za This test is not yet approved or cleared  by the [place] FDA and has been authorized for detection and/or diagnosis of SARS-CoV-2 by FDA under an Emergency Use Authorization (EUA).  This EUA will remain in effect (meaning this test can be used) for the duration of the COVID-19 declaration under Section 564(b)(1) of the Act, 21 U.S.C. section 360bbb-3(b)(1), unless the authorization is terminated or revoked sooner. Performed at [facility], [place], [place] [address]   Heparin level (unfractionated)     Status: Abnormal   Collection Time: 07/04/19  3:54 PM  Result Value Ref Range   Heparin Unfractionated >3.60 (H) 0.30 - 0.70 IU/mL    Comment: RESULTS CONFIRMED BY MANUAL DILUTION (NOTE) If heparin results are below expected values, and patient dosage has  been confirmed, suggest follow up testing of antithrombin III levels. Performed at [facility], [address] [place], [place] [address]   APTT     Status: Abnormal   Collection Time: 07/04/19  3:54 PM  Result Value Ref Range   aPTT 44 (H) 24 - 36 seconds    Comment:        IF BASELINE aPTT IS ELEVATED, SUGGEST PATIENT RISK ASSESSMENT BE USED TO DETERMINE APPROPRIATE ANTICOAGULANT THERAPY. Performed at [facility], [address] [place], [place] [address]   Basic metabolic panel     Status: Abnormal   Collection Time: 07/05/19  4:19 AM  Result Value Ref Range   Sodium 138 135 - 145 mmol/L   Potassium 4.2 3.5 - 5.1 mmol/L   Chloride 96 (L) 98 - 111 mmol/L   CO2 31 22 - 32 mmol/L   Glucose, Bld 108 (H) 70 - 99 mg/dL    Comment: Glucose reference range applies only to samples taken after fasting for at least 8 hours.   BUN 21 8 - 23 mg/dL   Creatinine, Ser 0.91 0.44 - 1.00 mg/dL   Calcium 9.2 8.9 - 10.3 mg/dL   GFR calc non Af Amer 54 (L) >60 mL/min   GFR calc Af Amer >60 >60 mL/min   Anion gap 11 5 - 15    Comment: Performed at [facility], [address] [place], [place] [address]  CBC     Status: Abnormal   Collection Time: 07/05/19  4:19 AM  Result Value Ref Range   WBC 6.5 4.0 - 10.5 K/uL   RBC 4.16 3.87 - 5.11 MIL/uL  Hemoglobin 11.9 (L) 12.0 - 15.0 g/dL   HCT 37.5 36 - 46 %   MCV 90.1 80.0 - 100.0 fL   MCH 28.6 26.0 - 34.0 pg   MCHC 31.7 30.0 - 36.0 g/dL   RDW 16.7 (H) 11.5 - 15.5 %   Platelets 203 150 - 400 K/uL   nRBC 0.0 0.0 - 0.2 %    Comment: Performed at [facility], [address] [place], [place] [address]  APTT     Status: Abnormal   Collection Time: 07/05/19  4:19 AM  Result Value Ref Range   aPTT >160 (HH) 24 - 36 seconds    Comment: CRITICAL RESULT CALLED TO, READ BACK BY AND VERIFIED WITH:  KAT NARDOZZI AT 0616 07/05/19 SDR        IF BASELINE aPTT IS ELEVATED, SUGGEST PATIENT RISK ASSESSMENT BE USED TO DETERMINE APPROPRIATE ANTICOAGULANT THERAPY. Performed at [facility], [place], [place] [address]   Heparin level (unfractionated)     Status: Abnormal   Collection Time: 07/05/19  4:19 AM  Result Value Ref Range   Heparin Unfractionated >3.60 (H) 0.30 - 0.70 IU/mL    Comment: RESULTS CONFIRMED BY MANUAL DILUTION (NOTE) If heparin results are below expected values, and patient dosage has  been confirmed, suggest  follow up testing of antithrombin III levels. Performed at [facility], [address] [place], [place] [address]   APTT     Status: Abnormal   Collection Time: 07/05/19  3:47 PM  Result Value Ref Range   aPTT >160 (HH) 24 - 36 seconds    Comment:        IF BASELINE aPTT IS ELEVATED, SUGGEST PATIENT RISK ASSESSMENT BE USED TO DETERMINE APPROPRIATE ANTICOAGULANT THERAPY. CRITICAL RESULT CALLED TO, READ BACK BY AND VERIFIED WITH: CHRIS BENNETT RN @1636  07/05/2019 BY ACR Performed at [facility], [address] [place], [place] [address]   APTT     Status: Abnormal   Collection Time: 07/06/19  1:59 AM  Result Value Ref Range   aPTT 119 (H) 24 - 36 seconds    Comment:        IF BASELINE aPTT IS ELEVATED, SUGGEST PATIENT RISK ASSESSMENT BE USED TO DETERMINE APPROPRIATE ANTICOAGULANT THERAPY. Performed at [facility], [place], [place] [address]   Heparin level (unfractionated)     Status: Abnormal   Collection Time: 07/06/19  1:59 AM  Result Value Ref Range   Heparin Unfractionated >3.60 (H) 0.30 - 0.70 IU/mL    Comment: RESULTS CONFIRMED BY MANUAL DILUTION (NOTE) If heparin results are below expected values, and patient dosage has  been confirmed, suggest follow up testing of antithrombin III levels. Performed at [facility], [address] [place],  [address]   Comprehensive metabolic panel     Status: Abnormal   Collection Time: 07/06/19  1:59 AM  Result Value Ref Range   Sodium 138 135 - 145 mmol/L   Potassium 4.0 3.5 - 5.1 mmol/L   Chloride 97 (L) 98 - 111 mmol/L   CO2 30 22 - 32 mmol/L   Glucose, Bld 117 (H) 70 - 99 mg/dL    Comment: Glucose reference range applies only to samples taken after fasting for at least 8 hours.   BUN 21 8 - 23 mg/dL   Creatinine, Ser 1.04 (H) 0.44 - 1.00 mg/dL   Calcium 9.1 8.9 - 10.3 mg/dL   Total Protein 6.3 (L) 6.5 -  8.1 g/dL   Albumin 3.4 (L) 3.5 - 5.0 g/dL   AST 17  15 - 41 U/L   ALT 6 0 - 44 U/L   Alkaline Phosphatase 53 38 - 126 U/L   Total Bilirubin 0.9 0.3 - 1.2 mg/dL   GFR calc non Af Amer 46 (L) >60 mL/min   GFR calc Af Amer 54 (L) >60 mL/min   Anion gap 11 5 - 15    Comment: Performed at [facility], [address] [place], [place] [address]  Magnesium     Status: None   Collection Time: 07/06/19  1:59 AM  Result Value Ref Range   Magnesium 2.1 1.7 - 2.4 mg/dL    Comment: Performed at [facility], [address] [place], [place] [address]  Phosphorus     Status: None   Collection Time: 07/06/19  1:59 AM  Result Value Ref Range   Phosphorus 3.4 2.5 - 4.6 mg/dL    Comment: Performed at [facility], [address] [place], [place] [address]  CBC with Differential/Platelet     Status: Abnormal   Collection Time: 07/06/19  1:59 AM  Result Value Ref Range   WBC 5.4 4.0 - 10.5 K/uL   RBC 3.88 3.87 - 5.11 MIL/uL   Hemoglobin 11.3 (L) 12.0 - 15.0 g/dL   HCT 36.0 36 - 46 %   MCV 92.8 80.0 - 100.0 fL   MCH 29.1 26.0 - 34.0 pg   MCHC 31.4 30.0 - 36.0 g/dL   RDW 16.7 (H) 11.5 - 15.5 %   Platelets 183 150 - 400 K/uL   nRBC 0.0 0.0 - 0.2 %   Neutrophils Relative % 71 %   Neutro Abs 3.9 1.7 - 7.7 K/uL   Lymphocytes Relative 10 %   Lymphs Abs 0.6 (L) 0.7 - 4.0 K/uL   Monocytes Relative 13 %   Monocytes Absolute 0.7 0 - 1 K/uL   Eosinophils Relative 4 %   Eosinophils Absolute 0.2 0 - 0 K/uL   Basophils Relative 1 %   Basophils Absolute 0.0 0 - 0 K/uL   Immature Granulocytes 1 %   Abs Immature Granulocytes 0.06 0.00 - 0.07 K/uL    Comment: Performed at [facility], [address] [place], [place] [address]  APTT     Status: Abnormal   Collection Time: 07/06/19  9:53 PM  Result Value Ref Range   aPTT 63 (H) 24 - 36 seconds    Comment:        IF BASELINE aPTT IS ELEVATED, SUGGEST PATIENT RISK ASSESSMENT BE USED TO DETERMINE APPROPRIATE ANTICOAGULANT THERAPY. Performed at [facility], [address] [place], [place] [address]   Comprehensive metabolic panel     Status: Abnormal   Collection Time: 07/07/19  8:00 AM  Result Value Ref Range   Sodium 140 135 - 145 mmol/L   Potassium 4.2 3.5 - 5.1 mmol/L   Chloride 99 98 - 111 mmol/L   CO2 31 22 - 32 mmol/L   Glucose, Bld 106 (H) 70 - 99 mg/dL    Comment: Glucose reference range applies only to samples taken after fasting for at least 8 hours.   BUN 13 8 - 23 mg/dL   Creatinine, Ser 0.83 0.44 - 1.00 mg/dL   Calcium 9.1 8.9 - 10.3 mg/dL   Total Protein 6.2 (L) 6.5 - 8.1 g/dL   Albumin 3.3 (L) 3.5 - 5.0 g/dL   AST 16 15 -  41 U/L   ALT 6 0 - 44 U/L   Alkaline Phosphatase 50 38 - 126 U/L   Total Bilirubin 0.8 0.3 - 1.2 mg/dL   GFR calc non Af Amer >60 >60 mL/min   GFR calc Af Amer >60 >60 mL/min   Anion gap 10 5 - 15    Comment: Performed at [facility], [address] [place], [place] [address]  Magnesium     Status: None   Collection Time: 07/07/19  8:00 AM  Result Value Ref Range   Magnesium 2.0 1.7 - 2.4 mg/dL    Comment: Performed at [facility], [address] [place], [place] [address]  Phosphorus     Status: None   Collection Time: 07/07/19  8:00 AM  Result Value Ref Range   Phosphorus 3.5 2.5 - 4.6 mg/dL    Comment: Performed at [facility], [address] [place], [place] [address]  CBC with Differential/Platelet     Status: Abnormal   Collection Time: 07/07/19  8:00 AM  Result Value Ref Range   WBC 5.2 4.0 - 10.5 K/uL   RBC 3.67 (L) 3.87 - 5.11 MIL/uL   Hemoglobin 10.6 (L) 12.0 - 15.0 g/dL   HCT 33.9 (L) 36 - 46 %   MCV 92.4 80.0 - 100.0 fL   MCH 28.9 26.0 - 34.0 pg   MCHC 31.3 30.0 - 36.0 g/dL   RDW 16.4 (H) 11.5 - 15.5 %   Platelets 179 150 - 400 K/uL   nRBC 0.0 0.0 - 0.2 %   Neutrophils Relative % 74 %   Neutro Abs 3.8 1.7 - 7.7 K/uL   Lymphocytes Relative 10 %   Lymphs Abs 0.5 (L) 0.7 - 4.0 K/uL   Monocytes Relative 12 %   Monocytes Absolute 0.6 0  - 1 K/uL   Eosinophils Relative 3 %   Eosinophils Absolute 0.2 0 - 0 K/uL   Basophils Relative 0 %   Basophils Absolute 0.0 0 - 0 K/uL   Immature Granulocytes 1 %   Abs Immature Granulocytes 0.07 0.00 - 0.07 K/uL    Comment: Performed at [facility], [address] [place], [place] [address]  Vitamin B12     Status: Abnormal   Collection Time: 07/07/19  8:00 AM  Result Value Ref Range   Vitamin B-12 3,183 (H) 180 - 914 pg/mL    Comment: (NOTE) This assay is not validated for testing neonatal or myeloproliferative syndrome specimens for Vitamin B12 levels. Performed at [facility], [place] [address], [place], [place] [address]   Folate     Status: Abnormal   Collection Time: 07/07/19  8:00 AM  Result Value Ref Range   Folate 5.9 (L) >5.9 ng/mL    Comment: Performed at [facility], [address] [place], [place] [address]  Iron and TIBC     Status: None   Collection Time: 07/07/19  8:00 AM  Result Value Ref Range   Iron 39 28 - 170 ug/dL   TIBC 291 250 - 450 ug/dL   Saturation Ratios 13 10.4 - 31.8 %   UIBC 252 ug/dL    Comment: Performed at [facility], [address] [place], [place] [address]  Ferritin     Status: None   Collection Time: 07/07/19  8:00 AM  Result Value Ref Range   Ferritin 110 11 - 307 ng/mL    Comment: Performed at [facility], [address],  [place], [place] [address]  Reticulocytes     Status: Abnormal   Collection Time: 07/07/19  8:00 AM  Result Value Ref Range   Retic Ct Pct 2.6 0.4 - 3.1 %   RBC. 3.63 (L) 3.87 - 5.11 MIL/uL   Retic Count, Absolute 93.3 19.0 - 186.0 K/uL   Immature Retic Fract 13.0 2.3 - 15.9 %    Comment: Performed at [facility], [address] [place], [place] [address]  APTT     Status: Abnormal   Collection Time: 07/07/19  8:00 AM  Result Value Ref Range   aPTT 46 (H) 24 - 36 seconds    Comment:        IF BASELINE aPTT IS ELEVATED, SUGGEST PATIENT RISK  ASSESSMENT BE USED TO DETERMINE APPROPRIATE ANTICOAGULANT THERAPY. Performed at [facility], [address] [place], [place] [address]   Cytology - Non PAP;     Status: None   Collection Time: 07/07/19  4:00 PM  Result Value Ref Range   CYTOLOGY - NON GYN      CYTOLOGY - NON PAP CASE: [id number] PATIENT: Cherrish Hayashida Non-Gynecological Cytology Report     Specimen Submitted: A. Pleural fluid, right  Clinical History: None provided    DIAGNOSIS: A. PLEURAL FLUID, RIGHT; ULTRASOUND-GUIDED THORACENTESIS: - POSITIVE FOR MALIGNANCY. - METASTATIC ADENOCARCINOMA, COMPATIBLE WITH PULMONARY PRIMARY, SIMILAR TO PREVIOUS ([id number]).  Slides reviewed: 1 ThinPrep, 1 cell block, 1 cytospin  GROSS DESCRIPTION: A. Labeled: Right pleural fluid Received: Fresh Volume: 1000 mL Description of fluid and container in which it is received: Received is red, cloudy fluid in a 1000 mL glass evacuated container. Cytospin slide(s) received: Yes, 1  Specimen material submitted for: Cell block and ThinPrep  The cell block material is fixed in formalin for 6 hours prior to processing.   Final Diagnosis performed by Allena Napoleon, MD.   Electronically signed 07/11/2019 2:37:33PM The electronic signature indicates that the named At tending Pathologist has evaluated the specimen Technical component performed at [facility], [address], [place], [place] [address] Lab: [phone] Dir: Rush Farmer, MD, MMM  Professional component performed at [facility], [facility], [address], [place], [place] [address] Lab: [phone] Dir: Dellia Nims. Rubinas, MD   Lactate dehydrogenase (pleural or peritoneal fluid)     Status: Abnormal   Collection Time: 07/07/19  4:00 PM  Result Value Ref Range   LD, Fluid 216 (H) 3 - 23 U/L    Comment: HEMOLYSIS AT THIS LEVEL MAY AFFECT RESULT (NOTE) Results should be evaluated in conjunction with serum values    Fluid Type-FLDH  CYTO PERI     Comment: Performed at [facility], [address] [place], [place] [address]  Triglycerides, Body Fluid     Status: None   Collection Time: 07/07/19  4:00 PM  Result Value Ref Range   Triglycerides, Fluid 27 Not Estab. mg/dL    Comment: (NOTE) The reference intervals and other method performance specifications have not been established for this test. The test result should be integrated into the clinical context for interpretation. The reference interval(s) and other method performance specifications have not been established for this body fluid. The test result must be integrated into the clinical context for interpretation. Performed At: [facility] [place], [place] [id number] Rush Farmer MD [id number]    Fluid Type-FTRIG CYTO PERI     Comment: Performed at [facility], [address] [place], [place] [address]  Body fluid cell count  with differential     Status: Abnormal   Collection Time: 07/07/19  4:00 PM  Result Value Ref Range   Fluid Type-FCT CYTO PERI    Color, Fluid RED (A) YELLOW   Appearance, Fluid TURBID (A) CLEAR   Total Nucleated Cell Count, Fluid 195 cu mm   Neutrophil Count, Fluid 7 %   Lymphs, Fluid 74 %   Monocyte-Macrophage-Serous Fluid 19 %   Eos, Fluid 0 %    Comment: Performed at [facility], [address], [place], [place] [address]  Body fluid culture     Status: None   Collection Time: 07/07/19  4:00 PM   Specimen: PATH Cytology Peritoneal fluid  Result Value Ref Range   Specimen Description      PERITONEAL Performed at [facility], [address] [place], [place] [address]    Special Requests      NONE Performed at [facility], [address] [place], [place] [address]    Gram Stain      RARE WBC PRESENT,BOTH PMN AND MONONUCLEAR NO ORGANISMS SEEN    Culture      NO GROWTH 3 DAYS Performed at [facility], [place] [address]  [place], [place] [address]    Report Status 07/10/2019 FINAL   Albumin, pleural or peritoneal fluid     Status: None   Collection Time: 07/07/19  4:00 PM  Result Value Ref Range   Albumin, Fluid 2.6 g/dL    Comment: (NOTE) No normal range established for this test Results should be evaluated in conjunction with serum values    Fluid Type-FALB CYTO PERI     Comment: Performed at [facility], [address] [place], [place] [address]  Protein, pleural or peritoneal fluid     Status: None   Collection Time: 07/07/19  4:00 PM  Result Value Ref Range   Total protein, fluid 3.6 g/dL    Comment: (NOTE) No normal range established for this test Results should be evaluated in conjunction with serum values    Fluid Type-FTP CYTO PERI     Comment: Performed at [facility], [address] [place], [place] [address]  Glucose, pleural or peritoneal fluid     Status: None   Collection Time: 07/07/19  4:00 PM  Result Value Ref Range   Glucose, Fluid 111 mg/dL    Comment: (NOTE) No normal range established for this test Results should be evaluated in conjunction with serum values    Fluid Type-FGLU CYTO PERI     Comment: Performed at [facility], [address] [place], [place] [address]  [facility], Body Fluid     Status: None   Collection Time: 07/07/19  4:00 PM  Result Value Ref Range   pH, Body Fluid 7.4 Not Estab.    Comment: (NOTE) This test was developed and its performance characteristics determined by [facility]. It has not been cleared or approved by the Food and Drug Administration. The reference interval(s) and other method performance specifications have not been established for this body fluid. The test result must be integrated into the clinical context for interpretation. Performed At: [facility] [place], [place] [id number] Rush Farmer MD [id number]    Source PERITONEAL     Comment: Performed at [facility],  [address] [place], [place] [address]  Comprehensive metabolic panel     Status: Abnormal   Collection Time: 07/08/19  6:15 AM  Result Value Ref Range  Sodium 138 135 - 145 mmol/L   Potassium 3.9 3.5 - 5.1 mmol/L   Chloride 97 (L) 98 - 111 mmol/L   CO2 31 22 - 32 mmol/L   Glucose, Bld 106 (H) 70 - 99 mg/dL    Comment: Glucose reference range applies only to samples taken after fasting for at least 8 hours.   BUN 12 8 - 23 mg/dL   Creatinine, Ser 0.96 0.44 - 1.00 mg/dL   Calcium 9.0 8.9 - 10.3 mg/dL   Total Protein 5.8 (L) 6.5 - 8.1 g/dL   Albumin 3.2 (L) 3.5 - 5.0 g/dL   AST 17 15 - 41 U/L   ALT 5 0 - 44 U/L   Alkaline Phosphatase 49 38 - 126 U/L   Total Bilirubin 0.8 0.3 - 1.2 mg/dL   GFR calc non Af Amer 51 (L) >60 mL/min   GFR calc Af Amer 59 (L) >60 mL/min   Anion gap 10 5 - 15    Comment: Performed at [facility], [address] [place], [place] [address]  Magnesium     Status: None   Collection Time: 07/08/19  6:15 AM  Result Value Ref Range   Magnesium 1.8 1.7 - 2.4 mg/dL    Comment: Performed at [facility], [address] [place], [place] [address]  Phosphorus     Status: None   Collection Time: 07/08/19  6:15 AM  Result Value Ref Range   Phosphorus 3.5 2.5 - 4.6 mg/dL    Comment: Performed at [facility], [address] [place], [place] [address]  CBC with Differential/Platelet     Status: Abnormal   Collection Time: 07/08/19  6:15 AM  Result Value Ref Range   WBC 5.7 4.0 - 10.5 K/uL   RBC 3.58 (L) 3.87 - 5.11 MIL/uL   Hemoglobin 10.6 (L) 12.0 - 15.0 g/dL   HCT 32.6 (L) 36 - 46 %   MCV 91.1 80.0 - 100.0 fL   MCH 29.6 26.0 - 34.0 pg   MCHC 32.5 30.0 - 36.0 g/dL   RDW 16.1 (H) 11.5 - 15.5 %   Platelets 168 150 - 400 K/uL   nRBC 0.0 0.0 - 0.2 %   Neutrophils Relative % 79 %   Neutro Abs 4.5 1.7 - 7.7 K/uL   Lymphocytes Relative 7 %   Lymphs Abs 0.4 (L) 0.7 - 4.0 K/uL   Monocytes Relative 10 %   Monocytes Absolute 0.6  0 - 1 K/uL   Eosinophils Relative 3 %   Eosinophils Absolute 0.2 0 - 0 K/uL   Basophils Relative 0 %   Basophils Absolute 0.0 0 - 0 K/uL   Immature Granulocytes 1 %   Abs Immature Granulocytes 0.05 0.00 - 0.07 K/uL    Comment: Performed at [facility], [address] [place],  [address]  Comprehensive metabolic panel     Status: Abnormal   Collection Time: 07/09/19  5:49 AM  Result Value Ref Range   Sodium 139 135 - 145 mmol/L   Potassium 3.7 3.5 - 5.1 mmol/L   Chloride 97 (L) 98 - 111 mmol/L   CO2 31 22 - 32 mmol/L   Glucose, Bld 100 (H) 70 - 99 mg/dL    Comment: Glucose reference range applies only to samples taken after fasting for at least 8 hours.   BUN 12 8 - 23 mg/dL   Creatinine, Ser 0.75 0.44 - 1.00 mg/dL   Calcium 8.9 8.9 - 10.3 mg/dL  Total Protein 5.6 (L) 6.5 - 8.1 g/dL   Albumin 3.0 (L) 3.5 - 5.0 g/dL   AST 14 (L) 15 - 41 U/L   ALT <5 0 - 44 U/L   Alkaline Phosphatase 45 38 - 126 U/L   Total Bilirubin 0.8 0.3 - 1.2 mg/dL   GFR calc non Af Amer >60 >60 mL/min   GFR calc Af Amer >60 >60 mL/min   Anion gap 11 5 - 15    Comment: Performed at [facility], [address] [place], [place] [address]  Magnesium     Status: None   Collection Time: 07/09/19  5:49 AM  Result Value Ref Range   Magnesium 1.7 1.7 - 2.4 mg/dL    Comment: Performed at [facility], [address] [place], [place] [address]  Phosphorus     Status: None   Collection Time: 07/09/19  5:49 AM  Result Value Ref Range   Phosphorus 3.8 2.5 - 4.6 mg/dL    Comment: Performed at [facility], [address] [place], [place] [address]  CBC with Differential/Platelet     Status: Abnormal   Collection Time: 07/09/19  5:49 AM  Result Value Ref Range   WBC 5.2 4.0 - 10.5 K/uL   RBC 3.47 (L) 3.87 - 5.11 MIL/uL   Hemoglobin 10.0 (L) 12.0 - 15.0 g/dL   HCT 31.3 (L) 36 - 46 %   MCV 90.2 80.0 - 100.0 fL   MCH 28.8 26.0 - 34.0 pg   MCHC 31.9 30.0 - 36.0  g/dL   RDW 15.9 (H) 11.5 - 15.5 %   Platelets 176 150 - 400 K/uL   nRBC 0.0 0.0 - 0.2 %   Neutrophils Relative % 73 %   Neutro Abs 3.8 1.7 - 7.7 K/uL   Lymphocytes Relative 10 %   Lymphs Abs 0.5 (L) 0.7 - 4.0 K/uL   Monocytes Relative 12 %   Monocytes Absolute 0.6 0 - 1 K/uL   Eosinophils Relative 4 %   Eosinophils Absolute 0.2 0 - 0 K/uL   Basophils Relative 0 %   Basophils Absolute 0.0 0 - 0 K/uL   Immature Granulocytes 1 %   Abs Immature Granulocytes 0.04 0.00 - 0.07 K/uL    Comment: Performed at [facility], [address] [place], [place] [address]  SARS Coronavirus 2 by RT PCR (hospital order, performed in [facility] hospital lab) Nasopharyngeal Nasopharyngeal Swab     Status: None   Collection Time: 07/10/19 11:42 AM   Specimen: Nasopharyngeal Swab  Result Value Ref Range   SARS Coronavirus 2 NEGATIVE NEGATIVE    Comment: (NOTE) SARS-CoV-2 target nucleic acids are NOT DETECTED. The SARS-CoV-2 RNA is generally detectable in upper and lower respiratory specimens during the acute phase of infection. The lowest concentration of SARS-CoV-2 viral copies this assay can detect is 250 copies / mL. A negative result does not preclude SARS-CoV-2 infection and should not be used as the sole basis for treatment or other patient management decisions.  A negative result may occur with improper specimen collection / handling, submission of specimen other than nasopharyngeal swab, presence of viral mutation(s) within the areas targeted by this assay, and inadequate number of viral copies (<250 copies / mL). A negative result must be combined with clinical observations, patient history, and epidemiological information. Fact Sheet for Patients:   StrictlyIdeas.no Fact Sheet for Healthcare Providers: BankingDealers.co.za This test is not yet approved or cleared  by the [place]  FDA and has been authorized for detection and/or  diagnosis of SARS-CoV-2 by FDA under an Emergency Use Authorization (EUA).  This EUA will remain in effect (meaning this test can be used) for the duration of the COVID-19 declaration under Section 564(b)(1) of the Act, 21 U.S.C. section 360bbb-3(b)(1), unless the authorization is terminated or revoked sooner. Performed at [facility], [address] [place], [place] [address]   Basic metabolic panel     Status: Abnormal   Collection Time: 07/19/19  9:20 AM  Result Value Ref Range   Sodium 136 135 - 145 mmol/L   Potassium 3.9 3.5 - 5.1 mmol/L   Chloride 98 98 - 111 mmol/L   CO2 27 22 - 32 mmol/L   Glucose, Bld 121 (H) 70 - 99 mg/dL    Comment: Glucose reference range applies only to samples taken after fasting for at least 8 hours.   BUN 14 8 - 23 mg/dL   Creatinine, Ser 0.98 0.44 - 1.00 mg/dL   Calcium 8.9 8.9 - 10.3 mg/dL   GFR calc non Af Amer 50 (L) >60 mL/min   GFR calc Af Amer 58 (L) >60 mL/min   Anion gap 11 5 - 15    Comment: Performed at [facility], [address] [place], [place] [address]  CBC with Differential     Status: Abnormal   Collection Time: 07/19/19  9:20 AM  Result Value Ref Range   WBC 5.8 4.0 - 10.5 K/uL   RBC 3.72 (L) 3.87 - 5.11 MIL/uL   Hemoglobin 10.8 (L) 12.0 - 15.0 g/dL   HCT 33.9 (L) 36 - 46 %   MCV 91.1 80.0 - 100.0 fL   MCH 29.0 26.0 - 34.0 pg   MCHC 31.9 30.0 - 36.0 g/dL   RDW 15.5 11.5 - 15.5 %   Platelets 247 150 - 400 K/uL   nRBC 0.0 0.0 - 0.2 %   Neutrophils Relative % 77 %   Neutro Abs 4.5 1.7 - 7.7 K/uL   Lymphocytes Relative 9 %   Lymphs Abs 0.5 (L) 0.7 - 4.0 K/uL   Monocytes Relative 10 %   Monocytes Absolute 0.6 0 - 1 K/uL   Eosinophils Relative 3 %   Eosinophils Absolute 0.2 0 - 0 K/uL   Basophils Relative 0 %   Basophils Absolute 0.0 0 - 0 K/uL   Immature Granulocytes 1 %   Abs Immature Granulocytes 0.05 0.00 - 0.07 K/uL    Comment: Performed at [facility], [address] [place],  [place] [address]  Thyroid Panel With TSH     Status: Abnormal   Collection Time: 07/19/19  9:25 AM  Result Value Ref Range   TSH 6.190 (H) 0.450 - 4.500 uIU/mL   T4, Total 8.4 4.5 - 12.0 ug/dL   T3 Uptake Ratio 26 24 - 39 %   Free Thyroxine Index 2.2 1.2 - 4.9    Comment: (NOTE) Performed At: [facility] [place], [place] [id number] Rush Farmer MD UK:[id number]   SARS CORONAVIRUS 2 (TAT 6-24 HRS) Nasopharyngeal Nasopharyngeal Swab     Status: None   Collection Time: 07/21/19  9:37 AM   Specimen: Nasopharyngeal Swab  Result Value Ref Range   SARS Coronavirus 2 NEGATIVE NEGATIVE    Comment: (NOTE) SARS-CoV-2 target nucleic acids are NOT DETECTED.  The SARS-CoV-2 RNA is generally detectable in upper and lower respiratory specimens during the acute phase of infection. Negative results do not preclude  SARS-CoV-2 infection, do not rule out co-infections with other pathogens, and should not be used as the sole basis for treatment or other patient management decisions. Negative results must be combined with clinical observations, patient history, and epidemiological information. The expected result is Negative.  Fact Sheet for Patients: SugarRoll.be  Fact Sheet for Healthcare Providers: https://www.woods-mathews.com/  This test is not yet approved or cleared by the [place] FDA and  has been authorized for detection and/or diagnosis of SARS-CoV-2 by FDA under an Emergency Use Authorization (EUA). This EUA will remain  in effect (meaning this test can be used) for the duration of the COVID-19 declaration under Se ction 564(b)(1) of the Act, 21 U.S.C. section 360bbb-3(b)(1), unless the authorization is terminated or revoked sooner.  Performed at [facility], [place] [address] [place], [place] [address]   SARS CORONAVIRUS 2 (TAT 6-24 HRS) Nasopharyngeal Nasopharyngeal Swab     Status: None   Collection Time:  08/03/19 11:25 AM   Specimen: Nasopharyngeal Swab  Result Value Ref Range   SARS Coronavirus 2 NEGATIVE NEGATIVE    Comment: (NOTE) SARS-CoV-2 target nucleic acids are NOT DETECTED.  The SARS-CoV-2 RNA is generally detectable in upper and lower respiratory specimens during the acute phase of infection. Negative results do not preclude SARS-CoV-2 infection, do not rule out co-infections with other pathogens, and should not be used as the sole basis for treatment or other patient management decisions. Negative results must be combined with clinical observations, patient history, and epidemiological information. The expected result is Negative.  Fact Sheet for Patients: SugarRoll.be  Fact Sheet for Healthcare Providers: https://www.woods-mathews.com/  This test is not yet approved or cleared by the [place] FDA and  has been authorized for detection and/or diagnosis of SARS-CoV-2 by FDA under an Emergency Use Authorization (EUA). This EUA will remain  in effect (meaning this test can be used) for the duration of the COVID-19 declaration under Se ction 564(b)(1) of the Act, 21 U.S.C. section 360bbb-3(b)(1), unless the authorization is terminated or revoked sooner.  Performed at [facility], [place] [address], [place], [place] [address]     Radiology: [facility] Chest Port 1 View  Result Date: 08/04/2019 CLINICAL DATA:  Post thoracentesis. EXAM: PORTABLE CHEST 1 VIEW COMPARISON:  07/22/2019. FINDINGS: Mediastinum hilar structures normal. Cardiomegaly. Stable mild bilateral interstitial prominence. Tiny right pleural effusion. No prominent effusion noted. No pneumothorax post thoracentesis. IMPRESSION: No pneumothorax post thoracentesis. Electronically Signed   By: Marcello Moores  Register   On: 08/04/2019 14:06   US THORACENTESIS ASP PLEURAL SPACE W/IMG GUIDE  Result Date: 08/04/2019 INDICATION: Patient with history of adenocarcinoma of the lung,  left breast cancer status post mastectomy with recurrent right malignant pleural effusion. Request to IR for therapeutic thoracentesis. EXAM: ULTRASOUND GUIDED RIGHT THORACENTESIS MEDICATIONS: 10 mL 1% lidocaine COMPLICATIONS: None immediate. PROCEDURE: An ultrasound guided thoracentesis was thoroughly discussed with the patient and questions answered. The benefits, risks, alternatives and complications were also discussed. The patient understands and wishes to proceed with the procedure. Written consent was obtained. Ultrasound was performed to localize and mark an adequate pocket of fluid in the right chest. The area was then prepped and draped in the normal sterile fashion. 1% Lidocaine was used for local anesthesia. Under ultrasound guidance a 6 Fr Safe-T-Centesis catheter was introduced. Thoracentesis was performed. The catheter was removed and a dressing applied. FINDINGS: A total of approximately 1.0 L of serosanguineous fluid was removed. IMPRESSION: Successful ultrasound guided right thoracentesis yielding 1.0 L  of pleural fluid. Read by Candiss Norse, PA-C Electronically Signed   By: Marcello Moores  Register   On: 08/04/2019 13:37    No results found.  DG Chest 1 View  Result Date: 07/22/2019 CLINICAL DATA:  Recurrent malignant right effusion, shortness of breath EXAM: CHEST  1 VIEW COMPARISON:  07/08/2019 FINDINGS: Resolution of the right pleural effusion following thoracentesis. Minimal residual basilar atelectasis, worse on the right. No pneumothorax. Stable cardiomegaly. Aorta atherosclerotic. Degenerative changes of the spine. IMPRESSION: Negative for pneumothorax following right thoracentesis. Stable chest exam. Electronically Signed   By: Jerilynn Mages.  Shick M.D.   On: 07/22/2019 13:45   DG Abd 1 View  Result Date: 08/02/2019 CLINICAL DATA:  Acute abdominal pain EXAM: ABDOMEN - 1 VIEW COMPARISON:  Chest radiograph July 22, 2019; PET-CT Jun 09, 2019 FINDINGS: Upright images obtained. There is moderate  stool in the colon. There is no bowel dilatation or air-fluid level to suggest bowel obstruction. No free air. There is asymmetric soft tissue opacity lateral to the left iliac crest of uncertain etiology. There is postoperative change in the area of known abdominal aortic aneurysm with stent in this area. This area appears grossly stable. Right pleural effusion with apparent right base mass lesion again noted. IMPRESSION: 1.  No bowel obstruction or free air. 2. Asymmetric soft tissue opacity lateral to the left iliac crest of uncertain etiology. CT potentially could be helpful to further evaluate this area if there is concern for pathology in this region. 3. Stent placement through known abdominal aortic aneurysm again noted. 4. Right pleural effusion with apparent mass right base, documented on previous PET study. Electronically Signed   By: Lowella Grip III M.D.   On: 08/02/2019 14:43   DG Chest Port 1 View  Result Date: 08/04/2019 CLINICAL DATA:  Post thoracentesis. EXAM: PORTABLE CHEST 1 VIEW COMPARISON:  07/22/2019. FINDINGS: Mediastinum hilar structures normal. Cardiomegaly. Stable mild bilateral interstitial prominence. Tiny right pleural effusion. No prominent effusion noted. No pneumothorax post thoracentesis. IMPRESSION: No pneumothorax post thoracentesis. Electronically Signed   By: Marcello Moores  Register   On: 08/04/2019 14:06   US THORACENTESIS ASP PLEURAL SPACE W/IMG GUIDE  Result Date: 08/04/2019 INDICATION: Patient with history of adenocarcinoma of the lung, left breast cancer status post mastectomy with recurrent right malignant pleural effusion. Request to IR for therapeutic thoracentesis. EXAM: ULTRASOUND GUIDED RIGHT THORACENTESIS MEDICATIONS: 10 mL 1% lidocaine COMPLICATIONS: None immediate. PROCEDURE: An ultrasound guided thoracentesis was thoroughly discussed with the patient and questions answered. The benefits, risks, alternatives and complications were also discussed. The patient  understands and wishes to proceed with the procedure. Written consent was obtained. Ultrasound was performed to localize and mark an adequate pocket of fluid in the right chest. The area was then prepped and draped in the normal sterile fashion. 1% Lidocaine was used for local anesthesia. Under ultrasound guidance a 6 Fr Safe-T-Centesis catheter was introduced. Thoracentesis was performed. The catheter was removed and a dressing applied. FINDINGS: A total of approximately 1.0 L of serosanguineous fluid was removed. IMPRESSION: Successful ultrasound guided right thoracentesis yielding 1.0 L of pleural fluid. Read by Candiss Norse, PA-C Electronically Signed   By: Marcello Moores  Register   On: 08/04/2019 13:37   US THORACENTESIS ASP PLEURAL SPACE W/IMG GUIDE  Result Date: 07/22/2019 INDICATION: Recurrent right malignant pleural effusion, shortness of breath EXAM: ULTRASOUND GUIDED RIGHT THORACENTESIS MEDICATIONS: 1% lidocaine local COMPLICATIONS: None immediate. PROCEDURE: An ultrasound guided thoracentesis was thoroughly discussed with the patient and questions answered. The benefits, risks, alternatives  and complications were also discussed. The patient understands and wishes to proceed with the procedure. Written consent was obtained. Ultrasound was performed to localize and mark an adequate pocket of fluid in the right chest. The area was then prepped and draped in the normal sterile fashion. 1% Lidocaine was used for local anesthesia. Under ultrasound guidance a 6 Fr Safe-T-Centesis catheter was introduced. Thoracentesis was performed. The catheter was removed and a dressing applied. FINDINGS: A total of approximately 1 L of bloody pleural fluid was removed. IMPRESSION: Successful ultrasound guided right thoracentesis yielding 1 L of pleural fluid. Electronically Signed   By: Jerilynn Mages.  Shick M.D.   On: 07/22/2019 13:46      Assessment and Plan: Patient Active Problem List   Diagnosis Date Noted   Palliative  care encounter    DVT (deep venous thrombosis) ([place]) 07/04/2019   Goals of care, counseling/discussion 06/13/2019   Hematoma 06/10/2019   Primary malignant neoplasm of right lower lobe of lung ([place]) 04/01/2019   Neoplasm of uncertain behavior of right lower lobe of lung 03/16/2019   Acute upper respiratory infection 12/08/2018   Vasomotor rhinitis 12/08/2018   Cough 09/16/2018   Dependence on nocturnal oxygen therapy 07/23/2018   SOB (shortness of breath) 07/23/2018   Lower extremity pain, bilateral 02/24/2018   Encounter for general adult medical examination with abnormal findings 02/22/2018   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 Lung Cancer she has advanced stage IV lung cancer.  She is basically under palliative care.  She also needs to be under hospice care.  I have taken the liberty to go ahead and refer her to hospice.  She also has severe pain related to the underlying cancer and she is going to need to have proper pain control. 2. Recurrent pleural effusion related to the underlying lung cancer she has had multiple thoracenteses done she should continue to have fluid removal for symptomatic relief. 3. Chronic Pain related to Lung Cancer I am going to start her on short acting morphine I have given her a prescription for 15 mg and she is got a total of 7 days worth of medication we will see what her utilization is and then perhaps place her on MS Contin  along with addition at a later time oxycodone for breakthrough pain.  Advil can be used also for pain. 4. SOB secondary to underlying malignancy pleural effusions and chronic lung disease supportive care.  I had a very lengthy discussion with the patient as well as the daughter.  They know that this is end-of-life care at this stage the are in agreement that she is going to try to enjoy the time that she has left however they are realistic and understand that death may not be too far away.  They would like to continue with more or less palliation for the tumor and I also did go ahead and make a hospice referral  General Counseling: I have discussed the findings of the evaluation and examination with Prentiss Bells.  I have also discussed any further diagnostic evaluation thatmay  be needed or ordered today. Eniyah verbalizes understanding of the findings of todays visit. We also reviewed her medications today and discussed drug interactions and side effects including but not limited excessive drowsiness and altered mental states. We also discussed that there is always a risk not just to her but also people around her. she has been encouraged to call the office with any questions or concerns that should arise related to todays visit.  Orders Placed This Encounter  Procedures   For home use only DME Nebulizer machine    Order Specific Question:   Patient needs a nebulizer to treat with the following condition    Answer:   COPD, severe ([place]) [[id number]]    Order Specific Question:   Length of Need    Answer:   Lifetime   Ambulatory referral to Hospice    Referral Priority:   Routine    Referral Type:   Consultation    Referral Reason:   Specialty Services Required    Requested Specialty:   Hospice Services    Number of Visits Requested:   1     Time spent: 44min  I have personally obtained a history, examined the patient, evaluated laboratory and imaging results, formulated the assessment and plan and  placed orders.    Allyne Gee, MD [facility] Pulmonary and Critical Care Sleep medicine

## 2019-08-15 NOTE — Patient Instructions (Signed)

## 2019-08-16 ENCOUNTER — Telehealth: Payer: Self-pay

## 2019-08-16 ENCOUNTER — Other Ambulatory Visit: Payer: Self-pay | Admitting: Internal Medicine

## 2019-08-16 DIAGNOSIS — J449 Chronic obstructive pulmonary disease, unspecified: Secondary | ICD-10-CM

## 2019-08-16 MED ORDER — BUDESONIDE 0.25 MG/2ML IN SUSP: 0.2500 mg | Freq: Two times a day (BID) | RESPIRATORY_TRACT | 12 refills | Status: AC

## 2019-08-16 MED ORDER — FORMOTEROL FUMARATE 20 MCG/2ML IN NEBU: 20.0000 ug | INHALATION_SOLUTION | Freq: Two times a day (BID) | RESPIRATORY_TRACT | 4 refills | Status: AC

## 2019-08-16 NOTE — Progress Notes (Signed)
Patient has had issues with her current meds via nebulizer. She will benefit from performist and budesonide via nebs in place of MDI

## 2019-08-16 NOTE — Telephone Encounter (Signed)
Left message with patient after discussing changing local pharmacy to [facility] and getting portable nebulizer and it will help with the cost of medications. Awaiting on [employer] to call back with the approval. Gail Nelson

## 2019-08-17 DIAGNOSIS — I82412 Acute embolism and thrombosis of left femoral vein: Secondary | ICD-10-CM | POA: Diagnosis not present

## 2019-08-17 DIAGNOSIS — C3491 Malignant neoplasm of unspecified part of right bronchus or lung: Secondary | ICD-10-CM | POA: Diagnosis not present

## 2019-08-17 DIAGNOSIS — I82812 Embolism and thrombosis of superficial veins of left lower extremities: Secondary | ICD-10-CM | POA: Diagnosis not present

## 2019-08-17 DIAGNOSIS — Z87891 Personal history of nicotine dependence: Secondary | ICD-10-CM | POA: Diagnosis not present

## 2019-08-17 DIAGNOSIS — J449 Chronic obstructive pulmonary disease, unspecified: Secondary | ICD-10-CM | POA: Diagnosis not present

## 2019-08-17 DIAGNOSIS — I10 Essential (primary) hypertension: Secondary | ICD-10-CM | POA: Diagnosis not present

## 2019-08-17 DIAGNOSIS — J910 Malignant pleural effusion: Secondary | ICD-10-CM | POA: Diagnosis not present

## 2019-08-17 DIAGNOSIS — Z853 Personal history of malignant neoplasm of breast: Secondary | ICD-10-CM | POA: Diagnosis not present

## 2019-08-17 DIAGNOSIS — Z923 Personal history of irradiation: Secondary | ICD-10-CM | POA: Diagnosis not present

## 2019-08-17 DIAGNOSIS — L7622 Postprocedural hemorrhage and hematoma of skin and subcutaneous tissue following other procedure: Secondary | ICD-10-CM | POA: Diagnosis not present

## 2019-08-17 DIAGNOSIS — K5900 Constipation, unspecified: Secondary | ICD-10-CM | POA: Diagnosis not present

## 2019-08-18 ENCOUNTER — Telehealth: Payer: Self-pay

## 2019-08-18 NOTE — Telephone Encounter (Signed)
Confirmed appointment on 08/22/2019 and screened for covid. [facility]

## 2019-08-19 DIAGNOSIS — J449 Chronic obstructive pulmonary disease, unspecified: Secondary | ICD-10-CM | POA: Diagnosis not present

## 2019-08-22 ENCOUNTER — Encounter: Payer: Self-pay | Admitting: Internal Medicine

## 2019-08-22 ENCOUNTER — Ambulatory Visit (INDEPENDENT_AMBULATORY_CARE_PROVIDER_SITE_OTHER): Payer: Medicare Other | Admitting: Internal Medicine

## 2019-08-22 ENCOUNTER — Other Ambulatory Visit: Payer: Self-pay

## 2019-08-22 VITALS — BP 131/65 | HR 97 | Temp 97.5°F | Resp 16 | Ht 66.0 in | Wt 186.4 lb

## 2019-08-22 DIAGNOSIS — I10 Essential (primary) hypertension: Secondary | ICD-10-CM

## 2019-08-22 DIAGNOSIS — G893 Neoplasm related pain (acute) (chronic): Secondary | ICD-10-CM | POA: Diagnosis not present

## 2019-08-22 DIAGNOSIS — C3491 Malignant neoplasm of unspecified part of right bronchus or lung: Secondary | ICD-10-CM | POA: Diagnosis not present

## 2019-08-22 DIAGNOSIS — J449 Chronic obstructive pulmonary disease, unspecified: Secondary | ICD-10-CM | POA: Diagnosis not present

## 2019-08-22 NOTE — Progress Notes (Signed)
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 Patients:   StrictlyIdeas.no Fact Sheet for Healthcare Providers: BankingDealers.co.za This test is not yet  approved or cleared  by the [place] FDA and has been authorized for detection and/or diagnosis of SARS-CoV-2 by FDA under an Emergency Use Authorization (EUA).  This EUA will remain in effect (meaning this test can be used) for the duration of the COVID-19 declaration under Section 564(b)(1) of the Act, 21 U.S.C. section 360bbb-3(b)(1), unless the authorization is terminated or revoked sooner. Performed at [facility], [place], [place] [address]   Heparin level (unfractionated)     Status: Abnormal   Collection Time: 07/04/19  3:54 PM  Result Value Ref Range   Heparin Unfractionated >3.60 (H) 0.30 - 0.70 IU/mL    Comment: RESULTS CONFIRMED BY MANUAL DILUTION (NOTE) If heparin results are below expected values, and patient dosage has  been confirmed, suggest follow up testing of antithrombin III levels. Performed at [facility], [address] [place], [place] [address]   APTT     Status: Abnormal   Collection Time: 07/04/19  3:54 PM  Result Value Ref Range   aPTT 44 (H) 24 - 36 seconds    Comment:        IF BASELINE aPTT IS ELEVATED, SUGGEST PATIENT RISK ASSESSMENT BE USED TO DETERMINE APPROPRIATE ANTICOAGULANT THERAPY. Performed at [facility], [address] [place], [place] [address]   Basic metabolic panel     Status: Abnormal   Collection Time: 07/05/19  4:19 AM  Result Value Ref Range   Sodium 138 135 - 145 mmol/L   Potassium 4.2 3.5 - 5.1 mmol/L   Chloride 96 (L) 98 - 111 mmol/L   CO2 31 22 - 32 mmol/L   Glucose, Bld 108 (H) 70 - 99 mg/dL    Comment: Glucose reference range applies only to samples taken after fasting for at least 8 hours.   BUN 21 8 - 23 mg/dL   Creatinine, Ser 0.91 0.44 - 1.00 mg/dL   Calcium 9.2 8.9 - 10.3 mg/dL   GFR calc non Af Amer 54 (L) >60 mL/min   GFR calc Af Amer >60 >60 mL/min   Anion gap 11 5 - 15    Comment: Performed at [facility], [address] [place],  [place] [address]  CBC     Status: Abnormal   Collection Time: 07/05/19  4:19 AM  Result Value Ref Range   WBC 6.5 4.0 - 10.5 K/uL   RBC 4.16 3.87 - 5.11 MIL/uL   Hemoglobin 11.9 (L) 12.0 - 15.0 g/dL   HCT 37.5 36 - 46 %   MCV 90.1 80.0 - 100.0 fL   MCH 28.6 26.0 - 34.0 pg   MCHC 31.7 30.0 - 36.0 g/dL   RDW 16.7 (H) 11.5 - 15.5 %   Platelets 203 150 - 400 K/uL   nRBC 0.0 0.0 - 0.2 %    Comment: Performed at [facility], [address] [place],  [address]  APTT     Status: Abnormal   Collection Time: 07/05/19  4:19 AM  Result Value Ref Range   aPTT >160 (HH) 24 - 36 seconds  Comment: CRITICAL RESULT CALLED TO, READ BACK BY AND VERIFIED WITH:  KAT NARDOZZI AT 0616 07/05/19 SDR        IF BASELINE aPTT IS ELEVATED, SUGGEST PATIENT RISK ASSESSMENT BE USED TO DETERMINE APPROPRIATE ANTICOAGULANT THERAPY. Performed at [facility], [place], [place] [address]   Heparin level (unfractionated)     Status: Abnormal   Collection Time: 07/05/19  4:19 AM  Result Value Ref Range   Heparin Unfractionated >3.60 (H) 0.30 - 0.70 IU/mL    Comment: RESULTS CONFIRMED BY MANUAL DILUTION (NOTE) If heparin results are below expected values, and patient dosage has  been confirmed, suggest follow up testing of antithrombin III levels. Performed at [facility], [address] [place], [place] [address]   APTT     Status: Abnormal   Collection Time: 07/05/19  3:47 PM  Result Value Ref Range   aPTT >160 (HH) 24 - 36 seconds    Comment:        IF BASELINE aPTT IS ELEVATED, SUGGEST PATIENT RISK ASSESSMENT BE USED TO DETERMINE APPROPRIATE ANTICOAGULANT THERAPY. CRITICAL RESULT CALLED TO, READ BACK BY AND VERIFIED WITH: CHRIS BENNETT RN @1636  07/05/2019 BY ACR Performed at [facility], [address] [place], [place] [address]   APTT     Status: Abnormal   Collection Time: 07/06/19  1:59 AM  Result Value Ref Range   aPTT 119 (H) 24 - 36  seconds    Comment:        IF BASELINE aPTT IS ELEVATED, SUGGEST PATIENT RISK ASSESSMENT BE USED TO DETERMINE APPROPRIATE ANTICOAGULANT THERAPY. Performed at [facility], [place], [place] [address]   Heparin level (unfractionated)     Status: Abnormal   Collection Time: 07/06/19  1:59 AM  Result Value Ref Range   Heparin Unfractionated >3.60 (H) 0.30 - 0.70 IU/mL    Comment: RESULTS CONFIRMED BY MANUAL DILUTION (NOTE) If heparin results are below expected values, and patient dosage has  been confirmed, suggest follow up testing of antithrombin III levels. Performed at [facility], [address] [place], [place] [address]   Comprehensive metabolic panel     Status: Abnormal   Collection Time: 07/06/19  1:59 AM  Result Value Ref Range   Sodium 138 135 - 145 mmol/L   Potassium 4.0 3.5 - 5.1 mmol/L   Chloride 97 (L) 98 - 111 mmol/L   CO2 30 22 - 32 mmol/L   Glucose, Bld 117 (H) 70 - 99 mg/dL    Comment: Glucose reference range applies only to samples taken after fasting for at least 8 hours.   BUN 21 8 - 23 mg/dL   Creatinine, Ser 1.04 (H) 0.44 - 1.00 mg/dL   Calcium 9.1 8.9 - 10.3 mg/dL   Total Protein 6.3 (L) 6.5 - 8.1 g/dL   Albumin 3.4 (L) 3.5 - 5.0 g/dL   AST 17 15 - 41 U/L   ALT 6 0 - 44 U/L   Alkaline Phosphatase 53 38 - 126 U/L   Total Bilirubin 0.9 0.3 - 1.2 mg/dL   GFR calc non Af Amer 46 (L) >60 mL/min   GFR calc Af Amer 54 (L) >60 mL/min   Anion gap 11 5 - 15    Comment: Performed at [facility], [address] [place], [place] [address]  Magnesium     Status: None   Collection Time: 07/06/19  1:59 AM  Result Value Ref Range  Magnesium 2.1 1.7 - 2.4 mg/dL    Comment: Performed at [facility], [address] [place], [place] [address]  Phosphorus     Status: None   Collection Time: 07/06/19  1:59 AM  Result Value Ref Range   Phosphorus 3.4 2.5 - 4.6 mg/dL    Comment: Performed at [facility], [address] [place], [place] [address]  CBC with Differential/Platelet     Status: Abnormal   Collection Time: 07/06/19  1:59 AM  Result Value Ref Range   WBC 5.4 4.0 - 10.5 K/uL   RBC 3.88 3.87 - 5.11 MIL/uL   Hemoglobin 11.3 (L) 12.0 - 15.0 g/dL   HCT 36.0 36 - 46 %   MCV 92.8 80.0 - 100.0 fL   MCH 29.1 26.0 - 34.0 pg   MCHC 31.4 30.0 - 36.0 g/dL   RDW 16.7 (H) 11.5 - 15.5 %   Platelets 183 150 - 400 K/uL   nRBC 0.0 0.0 - 0.2 %   Neutrophils Relative % 71 %   Neutro Abs 3.9 1.7 - 7.7 K/uL   Lymphocytes Relative 10 %   Lymphs Abs 0.6 (L) 0.7 - 4.0 K/uL   Monocytes Relative 13 %   Monocytes Absolute 0.7 0 - 1 K/uL   Eosinophils Relative 4 %   Eosinophils Absolute 0.2 0 - 0 K/uL   Basophils Relative 1 %   Basophils Absolute 0.0 0 - 0 K/uL   Immature Granulocytes 1 %   Abs Immature Granulocytes 0.06 0.00 - 0.07 K/uL    Comment: Performed at [facility], [address] [place], [place] [address]  APTT     Status: Abnormal   Collection Time: 07/06/19  9:53 PM  Result Value Ref Range   aPTT 63 (H) 24 - 36 seconds    Comment:        IF BASELINE aPTT IS ELEVATED, SUGGEST PATIENT RISK ASSESSMENT BE USED TO DETERMINE APPROPRIATE ANTICOAGULANT THERAPY. Performed at [facility], [address] [place], [place] [address]   Comprehensive metabolic panel     Status: Abnormal   Collection Time: 07/07/19  8:00 AM  Result Value Ref Range   Sodium 140 135 - 145 mmol/L   Potassium 4.2 3.5 - 5.1 mmol/L   Chloride 99 98 - 111 mmol/L   CO2 31 22 - 32 mmol/L   Glucose, Bld 106 (H) 70 - 99 mg/dL    Comment: Glucose reference range applies only to samples taken after fasting for at least 8 hours.   BUN 13 8 - 23 mg/dL   Creatinine, Ser 0.83 0.44 - 1.00 mg/dL   Calcium 9.1 8.9 - 10.3 mg/dL   Total Protein 6.2 (L) 6.5 - 8.1 g/dL   Albumin 3.3 (L) 3.5 - 5.0 g/dL   AST 16 15 - 41 U/L   ALT 6 0 - 44 U/L   Alkaline Phosphatase 50 38 - 126 U/L   Total  Bilirubin 0.8 0.3 - 1.2 mg/dL   GFR calc non Af Amer >60 >60 mL/min   GFR calc Af Amer >60 >60 mL/min   Anion gap 10 5 - 15    Comment: Performed at [facility], [address] [place], [place] [address]  Magnesium     Status: None   Collection Time: 07/07/19  8:00 AM  Result Value Ref Range   Magnesium 2.0 1.7 - 2.4 mg/dL    Comment: Performed at [facility], [address],  [place], [place] [address]  Phosphorus     Status: None   Collection Time: 07/07/19  8:00 AM  Result Value Ref Range   Phosphorus 3.5 2.5 - 4.6 mg/dL    Comment: Performed at [facility], [address] [place], [place] [address]  CBC with Differential/Platelet     Status: Abnormal   Collection Time: 07/07/19  8:00 AM  Result Value Ref Range   WBC 5.2 4.0 - 10.5 K/uL   RBC 3.67 (L) 3.87 - 5.11 MIL/uL   Hemoglobin 10.6 (L) 12.0 - 15.0 g/dL   HCT 33.9 (L) 36 - 46 %   MCV 92.4 80.0 - 100.0 fL   MCH 28.9 26.0 - 34.0 pg   MCHC 31.3 30.0 - 36.0 g/dL   RDW 16.4 (H) 11.5 - 15.5 %   Platelets 179 150 - 400 K/uL   nRBC 0.0 0.0 - 0.2 %   Neutrophils Relative % 74 %   Neutro Abs 3.8 1.7 - 7.7 K/uL   Lymphocytes Relative 10 %   Lymphs Abs 0.5 (L) 0.7 - 4.0 K/uL   Monocytes Relative 12 %   Monocytes Absolute 0.6 0 - 1 K/uL   Eosinophils Relative 3 %   Eosinophils Absolute 0.2 0 - 0 K/uL   Basophils Relative 0 %   Basophils Absolute 0.0 0 - 0 K/uL   Immature Granulocytes 1 %   Abs Immature Granulocytes 0.07 0.00 - 0.07 K/uL    Comment: Performed at [facility], [address] [place], [place] [address]  Vitamin B12     Status: Abnormal   Collection Time: 07/07/19  8:00 AM  Result Value Ref Range   Vitamin B-12 3,183 (H) 180 - 914 pg/mL    Comment: (NOTE) This assay is not validated for testing neonatal or myeloproliferative syndrome specimens for Vitamin B12 levels. Performed at [facility], [place] [address], [place], [place] [address]   Folate     Status:  Abnormal   Collection Time: 07/07/19  8:00 AM  Result Value Ref Range   Folate 5.9 (L) >5.9 ng/mL    Comment: Performed at [facility] [address] [place], [place] [address]  Iron and TIBC     Status: None   Collection Time: 07/07/19  8:00 AM  Result Value Ref Range   Iron 39 28 - 170 ug/dL   TIBC 291 250 - 450 ug/dL   Saturation Ratios 13 10.4 - 31.8 %   UIBC 252 ug/dL    Comment: Performed at [facility], [address] [place], [place] [address]  Ferritin     Status: None   Collection Time: 07/07/19  8:00 AM  Result Value Ref Range   Ferritin 110 11 - 307 ng/mL    Comment: Performed at [facility], [address] [place], [place] [address]  Reticulocytes     Status: Abnormal   Collection Time: 07/07/19  8:00 AM  Result Value Ref Range   Retic Ct Pct 2.6 0.4 - 3.1 %   RBC. 3.63 (L) 3.87 - 5.11 MIL/uL   Retic Count, Absolute 93.3 19.0 - 186.0 K/uL   Immature Retic Fract 13.0 2.3 - 15.9 %    Comment: Performed at [facility], [address] [place], [place] [address]  APTT     Status: Abnormal   Collection Time: 07/07/19  8:00 AM  Result Value Ref Range   aPTT 46 (H) 24 - 36 seconds    Comment:  IF BASELINE aPTT IS ELEVATED, SUGGEST PATIENT RISK ASSESSMENT BE USED TO DETERMINE APPROPRIATE ANTICOAGULANT THERAPY. Performed at [facility], [address] [place], [place] [address]   Cytology - Non PAP;     Status: None   Collection Time: 07/07/19  4:00 PM  Result Value Ref Range   CYTOLOGY - NON GYN      CYTOLOGY - NON PAP CASE: [id number] PATIENT: Charolette Snook Non-Gynecological Cytology Report     Specimen Submitted: A. Pleural fluid, right  Clinical History: None provided    DIAGNOSIS: A. PLEURAL FLUID, RIGHT; ULTRASOUND-GUIDED THORACENTESIS: - POSITIVE FOR MALIGNANCY. - METASTATIC ADENOCARCINOMA, COMPATIBLE WITH PULMONARY PRIMARY, SIMILAR TO PREVIOUS ([id number]).  Slides  reviewed: 1 ThinPrep, 1 cell block, 1 cytospin  GROSS DESCRIPTION: A. Labeled: Right pleural fluid Received: Fresh Volume: 1000 mL Description of fluid and container in which it is received: Received is red, cloudy fluid in a 1000 mL glass evacuated container. Cytospin slide(s) received: Yes, 1  Specimen material submitted for: Cell block and ThinPrep  The cell block material is fixed in formalin for 6 hours prior to processing.   Final Diagnosis performed by Allena Napoleon, MD.   Electronically signed 07/11/2019 2:37:33PM The electronic signature indicates that the named At tending Pathologist has evaluated the specimen Technical component performed at [facility], [address], [place], [place] [address] Lab: [id number] Dir: Rush Farmer, MD, MMM  Professional component performed at [facility], [address], [place], [place] [address] Lab: [phone] Dir: Dellia Nims. Rubinas, MD   Lactate dehydrogenase (pleural or peritoneal fluid)     Status: Abnormal   Collection Time: 07/07/19  4:00 PM  Result Value Ref Range   LD, Fluid 216 (H) 3 - 23 U/L    Comment: HEMOLYSIS AT THIS LEVEL MAY AFFECT RESULT (NOTE) Results should be evaluated in conjunction with serum values    Fluid Type-FLDH CYTO PERI     Comment: Performed at [facility], [address] [place], [place] [address]  Triglycerides, Body Fluid     Status: None   Collection Time: 07/07/19  4:00 PM  Result Value Ref Range   Triglycerides, Fluid 27 Not Estab. mg/dL    Comment: (NOTE) The reference intervals and other method performance specifications have not been established for this test. The test result should be integrated into the clinical context for interpretation. The reference interval(s) and other method performance specifications have not been established for this body fluid. The test result must be integrated into the clinical context for interpretation. Performed At: [facility] [address] [place], [place] [id number] Rush Farmer MD [id number]    Fluid Type-FTRIG CYTO PERI     Comment: Performed at [facility], [address] [place], [place] [address]  Body fluid cell count with differential     Status: Abnormal   Collection Time: 07/07/19  4:00 PM  Result Value Ref Range   Fluid Type-FCT CYTO PERI    Color, Fluid RED (A) YELLOW   Appearance, Fluid TURBID (A) CLEAR   Total Nucleated Cell Count, Fluid 195 cu mm   Neutrophil Count, Fluid 7 %   Lymphs, Fluid 74 %   Monocyte-Macrophage-Serous Fluid 19 %   Eos, Fluid 0 %    Comment: Performed at [facility], [address] [place], [place] [address]  Body fluid culture     Status: None   Collection Time: 07/07/19  4:00 PM   Specimen: PATH Cytology Peritoneal fluid  Result  Value Ref Range   Specimen Description      PERITONEAL Performed at [facility], [address] [place], [place] [address]    Special Requests      NONE Performed at [facility], [place], [place] [address]    Gram Stain      RARE WBC PRESENT,BOTH PMN AND MONONUCLEAR NO ORGANISMS SEEN    Culture      NO GROWTH 3 DAYS Performed at [facility], [place] [address] [place], [place] [address]    Report Status 07/10/2019 FINAL   Albumin, pleural or peritoneal fluid     Status: None   Collection Time: 07/07/19  4:00 PM  Result Value Ref Range   Albumin, Fluid 2.6 g/dL    Comment: (NOTE) No normal range established for this test Results should be evaluated in conjunction with serum values    Fluid Type-FALB CYTO PERI     Comment: Performed at [facility], [address] [place], [place] [address]  Protein, pleural or peritoneal fluid     Status: None   Collection Time: 07/07/19  4:00 PM  Result Value Ref Range   Total protein, fluid 3.6 g/dL    Comment: (NOTE) No normal range established for this test Results should be  evaluated in conjunction with serum values    Fluid Type-FTP CYTO PERI     Comment: Performed at [facility], [address] [place], [place] [address]  Glucose, pleural or peritoneal fluid     Status: None   Collection Time: 07/07/19  4:00 PM  Result Value Ref Range   Glucose, Fluid 111 mg/dL    Comment: (NOTE) No normal range established for this test Results should be evaluated in conjunction with serum values    Fluid Type-FGLU CYTO PERI     Comment: Performed at [facility], [address] [place], [place] [address]  [facility], Body Fluid     Status: None   Collection Time: 07/07/19  4:00 PM  Result Value Ref Range   pH, Body Fluid 7.4 Not Estab.    Comment: (NOTE) This test was developed and its performance characteristics determined by [facility]. It has not been cleared or approved by the Food and Drug Administration. The reference interval(s) and other method performance specifications have not been established for this body fluid. The test result must be integrated into the clinical context for interpretation. Performed At: [facility] [place], [place] [id number] Rush Farmer MD [id number]    Source PERITONEAL     Comment: Performed at [facility], [address] [place], [place] [address]  Comprehensive metabolic panel     Status: Abnormal   Collection Time: 07/08/19  6:15 AM  Result Value Ref Range   Sodium 138 135 - 145 mmol/L   Potassium 3.9 3.5 - 5.1 mmol/L   Chloride 97 (L) 98 - 111 mmol/L   CO2 31 22 - 32 mmol/L   Glucose, Bld 106 (H) 70 - 99 mg/dL    Comment: Glucose reference range applies only to samples taken after fasting for at least 8 hours.   BUN 12 8 - 23 mg/dL   Creatinine, Ser 0.96 0.44 - 1.00 mg/dL   Calcium 9.0 8.9 - 10.3 mg/dL   Total Protein 5.8 (L) 6.5 - 8.1 g/dL   Albumin 3.2 (L) 3.5 - 5.0 g/dL   AST 17 15 - 41 U/L   ALT 5 0 - 44  U/L   Alkaline Phosphatase 49 38 - 126 U/L   Total  Bilirubin 0.8 0.3 - 1.2 mg/dL   GFR calc non Af Amer 51 (L) >60 mL/min   GFR calc Af Amer 59 (L) >60 mL/min   Anion gap 10 5 - 15    Comment: Performed at [facility], [address] [place], [place] [address]  Magnesium     Status: None   Collection Time: 07/08/19  6:15 AM  Result Value Ref Range   Magnesium 1.8 1.7 - 2.4 mg/dL    Comment: Performed at [facility], [address] [place], [place] [address]  Phosphorus     Status: None   Collection Time: 07/08/19  6:15 AM  Result Value Ref Range   Phosphorus 3.5 2.5 - 4.6 mg/dL    Comment: Performed at [facility], [address] [place], [place] [address]  CBC with Differential/Platelet     Status: Abnormal   Collection Time: 07/08/19  6:15 AM  Result Value Ref Range   WBC 5.7 4.0 - 10.5 K/uL   RBC 3.58 (L) 3.87 - 5.11 MIL/uL   Hemoglobin 10.6 (L) 12.0 - 15.0 g/dL   HCT 32.6 (L) 36 - 46 %   MCV 91.1 80.0 - 100.0 fL   MCH 29.6 26.0 - 34.0 pg   MCHC 32.5 30.0 - 36.0 g/dL   RDW 16.1 (H) 11.5 - 15.5 %   Platelets 168 150 - 400 K/uL   nRBC 0.0 0.0 - 0.2 %   Neutrophils Relative % 79 %   Neutro Abs 4.5 1.7 - 7.7 K/uL   Lymphocytes Relative 7 %   Lymphs Abs 0.4 (L) 0.7 - 4.0 K/uL   Monocytes Relative 10 %   Monocytes Absolute 0.6 0 - 1 K/uL   Eosinophils Relative 3 %   Eosinophils Absolute 0.2 0 - 0 K/uL   Basophils Relative 0 %   Basophils Absolute 0.0 0 - 0 K/uL   Immature Granulocytes 1 %   Abs Immature Granulocytes 0.05 0.00 - 0.07 K/uL    Comment: Performed at [facility], [address] [place], [place] [address]  Comprehensive metabolic panel     Status: Abnormal   Collection Time: 07/09/19  5:49 AM  Result Value Ref Range   Sodium 139 135 - 145 mmol/L   Potassium 3.7 3.5 - 5.1 mmol/L   Chloride 97 (L) 98 - 111 mmol/L   CO2 31 22 - 32 mmol/L   Glucose, Bld 100 (H) 70 - 99 mg/dL    Comment: Glucose reference range applies only to samples taken after fasting for at least  8 hours.   BUN 12 8 - 23 mg/dL   Creatinine, Ser 0.75 0.44 - 1.00 mg/dL   Calcium 8.9 8.9 - 10.3 mg/dL   Total Protein 5.6 (L) 6.5 - 8.1 g/dL   Albumin 3.0 (L) 3.5 - 5.0 g/dL   AST 14 (L) 15 - 41 U/L   ALT <5 0 - 44 U/L   Alkaline Phosphatase 45 38 - 126 U/L   Total Bilirubin 0.8 0.3 - 1.2 mg/dL   GFR calc non Af Amer >60 >60 mL/min   GFR calc Af Amer >60 >60 mL/min   Anion gap 11 5 - 15    Comment: Performed at [facility], [address], [place], [place] [address]  Magnesium     Status: None   Collection Time: 07/09/19  5:49 AM  Result Value Ref Range  Magnesium 1.7 1.7 - 2.4 mg/dL    Comment: Performed at [facility], [address] [place], [place] [address]  Phosphorus     Status: None   Collection Time: 07/09/19  5:49 AM  Result Value Ref Range   Phosphorus 3.8 2.5 - 4.6 mg/dL    Comment: Performed at [facility], [address] [place], [place] [address]  CBC with Differential/Platelet     Status: Abnormal   Collection Time: 07/09/19  5:49 AM  Result Value Ref Range   WBC 5.2 4.0 - 10.5 K/uL   RBC 3.47 (L) 3.87 - 5.11 MIL/uL   Hemoglobin 10.0 (L) 12.0 - 15.0 g/dL   HCT 31.3 (L) 36 - 46 %   MCV 90.2 80.0 - 100.0 fL   MCH 28.8 26.0 - 34.0 pg   MCHC 31.9 30.0 - 36.0 g/dL   RDW 15.9 (H) 11.5 - 15.5 %   Platelets 176 150 - 400 K/uL   nRBC 0.0 0.0 - 0.2 %   Neutrophils Relative % 73 %   Neutro Abs 3.8 1.7 - 7.7 K/uL   Lymphocytes Relative 10 %   Lymphs Abs 0.5 (L) 0.7 - 4.0 K/uL   Monocytes Relative 12 %   Monocytes Absolute 0.6 0 - 1 K/uL   Eosinophils Relative 4 %   Eosinophils Absolute 0.2 0 - 0 K/uL   Basophils Relative 0 %   Basophils Absolute 0.0 0 - 0 K/uL   Immature Granulocytes 1 %   Abs Immature Granulocytes 0.04 0.00 - 0.07 K/uL    Comment: Performed at [facility], [address] [place], [place] [address]  SARS Coronavirus 2 by RT PCR (hospital order, performed in [facility] hospital lab) Nasopharyngeal  Nasopharyngeal Swab     Status: None   Collection Time: 07/10/19 11:42 AM   Specimen: Nasopharyngeal Swab  Result Value Ref Range   SARS Coronavirus 2 NEGATIVE NEGATIVE    Comment: (NOTE) SARS-CoV-2 target nucleic acids are NOT DETECTED. The SARS-CoV-2 RNA is generally detectable in upper and lower respiratory specimens during the acute phase of infection. The lowest concentration of SARS-CoV-2 viral copies this assay can detect is 250 copies / mL. A negative result does not preclude SARS-CoV-2 infection and should not be used as the sole basis for treatment or other patient management decisions.  A negative result may occur with improper specimen collection / handling, submission of specimen other than nasopharyngeal swab, presence of viral mutation(s) within the areas targeted by this assay, and inadequate number of viral copies (<250 copies / mL). A negative result must be combined with clinical observations, patient history, and epidemiological information. Fact Sheet for Patients:   StrictlyIdeas.no Fact Sheet for Healthcare Providers: BankingDealers.co.za This test is not yet approved or cleared  by the [place] FDA and has been authorized for detection and/or diagnosis of SARS-CoV-2 by FDA under an Emergency Use Authorization (EUA).  This EUA will remain in effect (meaning this test can be used) for the duration of the COVID-19 declaration under Section 564(b)(1) of the Act, 21 U.S.C. section 360bbb-3(b)(1), unless the authorization is terminated or revoked sooner. Performed at [facility], [address] [place], [place] [address]   Basic metabolic panel     Status: Abnormal   Collection Time: 07/19/19  9:20 AM  Result Value Ref Range   Sodium 136 135 - 145 mmol/L   Potassium 3.9 3.5 - 5.1 mmol/L   Chloride 98 98 - 111 mmol/L  CO2 27 22 - 32 mmol/L   Glucose, Bld 121 (H) 70 - 99 mg/dL    Comment: Glucose  reference range applies only to samples taken after fasting for at least 8 hours.   BUN 14 8 - 23 mg/dL   Creatinine, Ser 0.98 0.44 - 1.00 mg/dL   Calcium 8.9 8.9 - 10.3 mg/dL   GFR calc non Af Amer 50 (L) >60 mL/min   GFR calc Af Amer 58 (L) >60 mL/min   Anion gap 11 5 - 15    Comment: Performed at [facility], [address] [place], [place] [address]  CBC with Differential     Status: Abnormal   Collection Time: 07/19/19  9:20 AM  Result Value Ref Range   WBC 5.8 4.0 - 10.5 K/uL   RBC 3.72 (L) 3.87 - 5.11 MIL/uL   Hemoglobin 10.8 (L) 12.0 - 15.0 g/dL   HCT 33.9 (L) 36 - 46 %   MCV 91.1 80.0 - 100.0 fL   MCH 29.0 26.0 - 34.0 pg   MCHC 31.9 30.0 - 36.0 g/dL   RDW 15.5 11.5 - 15.5 %   Platelets 247 150 - 400 K/uL   nRBC 0.0 0.0 - 0.2 %   Neutrophils Relative % 77 %   Neutro Abs 4.5 1.7 - 7.7 K/uL   Lymphocytes Relative 9 %   Lymphs Abs 0.5 (L) 0.7 - 4.0 K/uL   Monocytes Relative 10 %   Monocytes Absolute 0.6 0 - 1 K/uL   Eosinophils Relative 3 %   Eosinophils Absolute 0.2 0 - 0 K/uL   Basophils Relative 0 %   Basophils Absolute 0.0 0 - 0 K/uL   Immature Granulocytes 1 %   Abs Immature Granulocytes 0.05 0.00 - 0.07 K/uL    Comment: Performed at [facility], [address] [place], [place] [address]  Thyroid Panel With TSH     Status: Abnormal   Collection Time: 07/19/19  9:25 AM  Result Value Ref Range   TSH 6.190 (H) 0.450 - 4.500 uIU/mL   T4, Total 8.4 4.5 - 12.0 ug/dL   T3 Uptake Ratio 26 24 - 39 %   Free Thyroxine Index 2.2 1.2 - 4.9    Comment: (NOTE) Performed At: [facility] [place], [place] [id number] Rush Farmer MD [id number]   SARS CORONAVIRUS 2 (TAT 6-24 HRS) Nasopharyngeal Nasopharyngeal Swab     Status: None   Collection Time: 07/21/19  9:37 AM   Specimen: Nasopharyngeal Swab  Result Value Ref Range   SARS Coronavirus 2 NEGATIVE NEGATIVE    Comment: (NOTE) SARS-CoV-2 target nucleic acids are NOT DETECTED.   The SARS-CoV-2 RNA is generally detectable in upper and lower respiratory specimens during the acute phase of infection. Negative results do not preclude SARS-CoV-2 infection, do not rule out co-infections with other pathogens, and should not be used as the sole basis for treatment or other patient management decisions. Negative results must be combined with clinical observations, patient history, and epidemiological information. The expected result is Negative.  Fact Sheet for Patients: SugarRoll.be  Fact Sheet for Healthcare Providers: https://www.woods-mathews.com/  This test is not yet approved or cleared by the [place] FDA and  has been authorized for detection and/or diagnosis of SARS-CoV-2 by FDA under an Emergency Use Authorization (EUA). This EUA will remain  in effect (meaning this test can be used) for the duration of the COVID-19 declaration under Se ction 564(b)(1) of the Act, 21 U.S.C.  section 360bbb-3(b)(1), unless the authorization is terminated or revoked sooner.  Performed at [facility], [place] [address], [place], [place] [address]   SARS CORONAVIRUS 2 (TAT 6-24 HRS) Nasopharyngeal Nasopharyngeal Swab     Status: None   Collection Time: 08/03/19 11:25 AM   Specimen: Nasopharyngeal Swab  Result Value Ref Range   SARS Coronavirus 2 NEGATIVE NEGATIVE    Comment: (NOTE) SARS-CoV-2 target nucleic acids are NOT DETECTED.  The SARS-CoV-2 RNA is generally detectable in upper and lower respiratory specimens during the acute phase of infection. Negative results do not preclude SARS-CoV-2 infection, do not rule out co-infections with other pathogens, and should not be used as the sole basis for treatment or other patient management decisions. Negative results must be combined with clinical observations, patient history, and epidemiological information. The expected result is Negative.  Fact Sheet for Patients:  SugarRoll.be  Fact Sheet for Healthcare Providers: https://www.woods-mathews.com/  This test is not yet approved or cleared by the [place] FDA and  has been authorized for detection and/or diagnosis of SARS-CoV-2 by FDA under an Emergency Use Authorization (EUA). This EUA will remain  in effect (meaning this test can be used) for the duration of the COVID-19 declaration under Se ction 564(b)(1) of the Act, 21 U.S.C. section 360bbb-3(b)(1), unless the authorization is terminated or revoked sooner.  Performed at [facility], [place] [address], [place], [place] [address]     Radiology: [facility] Chest Port 1 View  Result Date: 08/04/2019 CLINICAL DATA:  Post thoracentesis. EXAM: PORTABLE CHEST 1 VIEW COMPARISON:  07/22/2019. FINDINGS: Mediastinum hilar structures normal. Cardiomegaly. Stable mild bilateral interstitial prominence. Tiny right pleural effusion. No prominent effusion noted. No pneumothorax post thoracentesis. IMPRESSION: No pneumothorax post thoracentesis. Electronically Signed   By: Marcello Moores  Register   On: 08/04/2019 14:06   US THORACENTESIS ASP PLEURAL SPACE W/IMG GUIDE  Result Date: 08/04/2019 INDICATION: Patient with history of adenocarcinoma of the lung, left breast cancer status post mastectomy with recurrent right malignant pleural effusion. Request to IR for therapeutic thoracentesis. EXAM: ULTRASOUND GUIDED RIGHT THORACENTESIS MEDICATIONS: 10 mL 1% lidocaine COMPLICATIONS: None immediate. PROCEDURE: An ultrasound guided thoracentesis was thoroughly discussed with the patient and questions answered. The benefits, risks, alternatives and complications were also discussed. The patient understands and wishes to proceed with the procedure. Written consent was obtained. Ultrasound was performed to localize and mark an adequate pocket of fluid in the right chest. The area was then prepped and draped in the normal sterile fashion. 1%  Lidocaine was used for local anesthesia. Under ultrasound guidance a 6 Fr Safe-T-Centesis catheter was introduced. Thoracentesis was performed. The catheter was removed and a dressing applied. FINDINGS: A total of approximately 1.0 L of serosanguineous fluid was removed. IMPRESSION: Successful ultrasound guided right thoracentesis yielding 1.0 L of pleural fluid. Read by Candiss Norse, PA-C Electronically Signed   By: Marcello Moores  Register   On: 08/04/2019 13:37    No results found.  DG Abd 1 View  Result Date: 08/02/2019 CLINICAL DATA:  Acute abdominal pain EXAM: ABDOMEN - 1 VIEW COMPARISON:  Chest radiograph July 22, 2019; PET-CT Jun 09, 2019 FINDINGS: Upright images obtained. There is moderate stool in the colon. There is no bowel dilatation or air-fluid level to suggest bowel obstruction. No free air. There is asymmetric soft tissue opacity lateral to the left iliac crest of uncertain etiology. There is postoperative change in the area of known abdominal aortic aneurysm with stent in this area. This area appears  grossly stable. Right pleural effusion with apparent right base mass lesion again noted. IMPRESSION: 1.  No bowel obstruction or free air. 2. Asymmetric soft tissue opacity lateral to the left iliac crest of uncertain etiology. CT potentially could be helpful to further evaluate this area if there is concern for pathology in this region. 3. Stent placement through known abdominal aortic aneurysm again noted. 4. Right pleural effusion with apparent mass right base, documented on previous PET study. Electronically Signed   By: Lowella Grip III M.D.   On: 08/02/2019 14:43   DG Chest Port 1 View  Result Date: 08/04/2019 CLINICAL DATA:  Post thoracentesis. EXAM: PORTABLE CHEST 1 VIEW COMPARISON:  07/22/2019. FINDINGS: Mediastinum hilar structures normal. Cardiomegaly. Stable mild bilateral interstitial prominence. Tiny right pleural effusion. No prominent effusion noted. No pneumothorax post  thoracentesis. IMPRESSION: No pneumothorax post thoracentesis. Electronically Signed   By: Marcello Moores  Register   On: 08/04/2019 14:06   US THORACENTESIS ASP PLEURAL SPACE W/IMG GUIDE  Result Date: 08/04/2019 INDICATION: Patient with history of adenocarcinoma of the lung, left breast cancer status post mastectomy with recurrent right malignant pleural effusion. Request to IR for therapeutic thoracentesis. EXAM: ULTRASOUND GUIDED RIGHT THORACENTESIS MEDICATIONS: 10 mL 1% lidocaine COMPLICATIONS: None immediate. PROCEDURE: An ultrasound guided thoracentesis was thoroughly discussed with the patient and questions answered. The benefits, risks, alternatives and complications were also discussed. The patient understands and wishes to proceed with the procedure. Written consent was obtained. Ultrasound was performed to localize and mark an adequate pocket of fluid in the right chest. The area was then prepped and draped in the normal sterile fashion. 1% Lidocaine was used for local anesthesia. Under ultrasound guidance a 6 Fr Safe-T-Centesis catheter was introduced. Thoracentesis was performed. The catheter was removed and a dressing applied. FINDINGS: A total of approximately 1.0 L of serosanguineous fluid was removed. IMPRESSION: Successful ultrasound guided right thoracentesis yielding 1.0 L of pleural fluid. Read by Candiss Norse, PA-C Electronically Signed   By: Marcello Moores  Register   On: 08/04/2019 13:37      Assessment and Plan: Patient Active Problem List   Diagnosis Date Noted  . Palliative care encounter   . DVT (deep venous thrombosis) ([place]) 07/04/2019  . Goals of care, counseling/discussion 06/13/2019  . Hematoma 06/10/2019  . Primary malignant neoplasm of right lower lobe of lung ([place]) 04/01/2019  . Neoplasm of uncertain behavior of right lower lobe of lung 03/16/2019  . Acute upper respiratory infection 12/08/2018  . Vasomotor rhinitis 12/08/2018  . Cough 09/16/2018  . Dependence on nocturnal  oxygen therapy 07/23/2018  . SOB (shortness of breath) 07/23/2018  . Lower extremity pain, bilateral 02/24/2018  . Encounter for general adult medical examination with abnormal findings 02/22/2018  . 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 Bilateral lower extremity edema 02/22/2018  . Dysuria 02/22/2018  . AAA (abdominal aortic aneurysm) without rupture ([place]) 08/23/2017  . Lymphedema 08/23/2017  . Chest pain 07/26/2017  . Tinea corporis 07/26/2017  . Obstructive chronic bronchitis without exacerbation ([place]) 02/19/2017  . Emphysema, unspecified ([place]) 02/19/2017  . Chronic obstructive pulmonary disease ([place]) 03/28/2016  . Benign hypertension 03/28/2016    1. COPD, severe ([place]) Continue to use oxygen as prescribed, may increase to 3 lpm.   2. Malignant neoplasm of right lung, unspecified part of lung ([place]) Continue to follow up with oncology as directed.   3. Chronic pain due to malignant neoplastic disease Continue to use  morphine as prescribed. Excellent pain control at this time.   4. Benign hypertension Stable, continue to monitor.   General Counseling: I have discussed the findings of the evaluation and examination with Rin.  I have also discussed any further diagnostic evaluation thatmay be needed or ordered today. Yitty verbalizes understanding of the findings of todays visit. We also reviewed her medications today and discussed drug interactions and side effects including but not limited excessive drowsiness and altered mental states. We also discussed that there is always a risk not just to her but also people around her. she has been encouraged to call the office with any questions or concerns that should arise related to todays visit.  No orders of the defined types were placed in this encounter.    Time spent: 30 This patient was seen by Orson Gear AGNP-C in  Collaboration with Dr. Devona Konig as a part of collaborative care agreement.   I have personally obtained a history, examined the patient, evaluated laboratory and imaging results, formulated the assessment and plan and placed orders.    Allyne Gee, MD [facility] Sleep medicine

## 2019-08-23 DIAGNOSIS — J449 Chronic obstructive pulmonary disease, unspecified: Secondary | ICD-10-CM | POA: Diagnosis not present

## 2019-08-23 DIAGNOSIS — I82412 Acute embolism and thrombosis of left femoral vein: Secondary | ICD-10-CM | POA: Diagnosis not present

## 2019-08-23 DIAGNOSIS — C3491 Malignant neoplasm of unspecified part of right bronchus or lung: Secondary | ICD-10-CM | POA: Diagnosis not present

## 2019-08-23 DIAGNOSIS — I82812 Embolism and thrombosis of superficial veins of left lower extremities: Secondary | ICD-10-CM | POA: Diagnosis not present

## 2019-08-23 DIAGNOSIS — Z923 Personal history of irradiation: Secondary | ICD-10-CM | POA: Diagnosis not present

## 2019-08-23 DIAGNOSIS — J910 Malignant pleural effusion: Secondary | ICD-10-CM | POA: Diagnosis not present

## 2019-08-23 DIAGNOSIS — Z853 Personal history of malignant neoplasm of breast: Secondary | ICD-10-CM | POA: Diagnosis not present

## 2019-08-23 DIAGNOSIS — I10 Essential (primary) hypertension: Secondary | ICD-10-CM | POA: Diagnosis not present

## 2019-08-23 DIAGNOSIS — K5900 Constipation, unspecified: Secondary | ICD-10-CM | POA: Diagnosis not present

## 2019-08-23 DIAGNOSIS — L7622 Postprocedural hemorrhage and hematoma of skin and subcutaneous tissue following other procedure: Secondary | ICD-10-CM | POA: Diagnosis not present

## 2019-08-23 DIAGNOSIS — Z87891 Personal history of nicotine dependence: Secondary | ICD-10-CM | POA: Diagnosis not present

## 2019-08-25 DIAGNOSIS — Z853 Personal history of malignant neoplasm of breast: Secondary | ICD-10-CM | POA: Diagnosis not present

## 2019-08-25 DIAGNOSIS — Z923 Personal history of irradiation: Secondary | ICD-10-CM | POA: Diagnosis not present

## 2019-08-25 DIAGNOSIS — Z87891 Personal history of nicotine dependence: Secondary | ICD-10-CM | POA: Diagnosis not present

## 2019-08-25 DIAGNOSIS — I10 Essential (primary) hypertension: Secondary | ICD-10-CM | POA: Diagnosis not present

## 2019-08-25 DIAGNOSIS — I82812 Embolism and thrombosis of superficial veins of left lower extremities: Secondary | ICD-10-CM | POA: Diagnosis not present

## 2019-08-25 DIAGNOSIS — J449 Chronic obstructive pulmonary disease, unspecified: Secondary | ICD-10-CM | POA: Diagnosis not present

## 2019-08-25 DIAGNOSIS — I82412 Acute embolism and thrombosis of left femoral vein: Secondary | ICD-10-CM | POA: Diagnosis not present

## 2019-08-25 DIAGNOSIS — C3491 Malignant neoplasm of unspecified part of right bronchus or lung: Secondary | ICD-10-CM | POA: Diagnosis not present

## 2019-08-25 DIAGNOSIS — L7622 Postprocedural hemorrhage and hematoma of skin and subcutaneous tissue following other procedure: Secondary | ICD-10-CM | POA: Diagnosis not present

## 2019-08-25 DIAGNOSIS — K5900 Constipation, unspecified: Secondary | ICD-10-CM | POA: Diagnosis not present

## 2019-08-25 DIAGNOSIS — J910 Malignant pleural effusion: Secondary | ICD-10-CM | POA: Diagnosis not present

## 2019-08-26 ENCOUNTER — Telehealth: Payer: Self-pay

## 2019-08-26 NOTE — Telephone Encounter (Signed)
Confirmation of order signed by provider and placed in [place] folder.

## 2019-08-26 NOTE — Telephone Encounter (Signed)
Confirmed appointment on 08/29/2019. klh

## 2019-08-27 ENCOUNTER — Other Ambulatory Visit: Payer: Self-pay | Admitting: Adult Health

## 2019-08-27 MED ORDER — MORPHINE SULFATE 15 MG PO TABS: 15.0000 mg | ORAL_TABLET | ORAL | 0 refills | Status: DC | PRN

## 2019-08-27 NOTE — Progress Notes (Signed)
New RX sent for Morphine 15mg  po.  Patient has chronic pain related to lung cancer.  Will see her again in office in two days.

## 2019-08-29 ENCOUNTER — Encounter: Payer: Self-pay | Admitting: Internal Medicine

## 2019-08-29 ENCOUNTER — Other Ambulatory Visit: Payer: Self-pay

## 2019-08-29 ENCOUNTER — Ambulatory Visit (INDEPENDENT_AMBULATORY_CARE_PROVIDER_SITE_OTHER): Payer: Medicare Other | Admitting: Internal Medicine

## 2019-08-29 VITALS — BP 153/93 | HR 101 | Temp 97.5°F | Resp 16 | Ht 63.0 in | Wt 183.0 lb

## 2019-08-29 DIAGNOSIS — C3491 Malignant neoplasm of unspecified part of right bronchus or lung: Secondary | ICD-10-CM | POA: Diagnosis not present

## 2019-08-29 DIAGNOSIS — L89153 Pressure ulcer of sacral region, stage 3: Secondary | ICD-10-CM

## 2019-08-29 DIAGNOSIS — J449 Chronic obstructive pulmonary disease, unspecified: Secondary | ICD-10-CM

## 2019-08-29 DIAGNOSIS — G893 Neoplasm related pain (acute) (chronic): Secondary | ICD-10-CM

## 2019-08-29 MED ORDER — MORPHINE SULFATE 15 MG PO TABS: 15.0000 mg | ORAL_TABLET | ORAL | 0 refills | Status: DC | PRN

## 2019-08-29 NOTE — Progress Notes (Signed)
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 viral mutation(s) within the areas targeted by this assay, and inadequate number of viral copies (<250 copies / mL). A negative  result must be combined with clinical observations, patient history, and epidemiological information. Fact Sheet for Patients:   StrictlyIdeas.no Fact Sheet for Healthcare Providers: BankingDealers.co.za This test is not yet approved or cleared  by the [place] FDA and has been authorized for detection and/or diagnosis of SARS-CoV-2 by FDA under an Emergency Use Authorization (EUA).  This EUA will remain in effect (meaning this test can be used) for the duration of the COVID-19 declaration under Section 564(b)(1) of the Act, 21 U.S.C. section 360bbb-3(b)(1), unless the authorization is terminated or revoked sooner. Performed at [facility], [place], [place] [address]   Heparin level (unfractionated)     Status: Abnormal   Collection Time: 07/04/19  3:54 PM  Result Value Ref Range   Heparin Unfractionated >3.60 (H) 0.30 - 0.70 IU/mL    Comment: RESULTS CONFIRMED BY MANUAL DILUTION (NOTE) If heparin results are below expected values, and patient dosage has  been confirmed, suggest follow up testing of antithrombin III levels. Performed at [facility], [address] [place], [place] [address]   APTT     Status: Abnormal   Collection Time: 07/04/19  3:54 PM  Result Value Ref Range   aPTT 44 (H) 24 - 36 seconds    Comment:        IF BASELINE aPTT IS ELEVATED, SUGGEST PATIENT RISK ASSESSMENT BE USED TO DETERMINE APPROPRIATE ANTICOAGULANT THERAPY. Performed at [facility], [address] [place], [place] [address]   Basic metabolic panel     Status: Abnormal   Collection Time: 07/05/19  4:19 AM  Result Value Ref Range   Sodium 138 135 - 145 mmol/L   Potassium 4.2 3.5 - 5.1 mmol/L   Chloride 96 (L) 98 - 111 mmol/L   CO2 31 22 - 32 mmol/L   Glucose, Bld 108 (H) 70 - 99 mg/dL    Comment: Glucose reference range applies only to samples taken after fasting for at least 8 hours.    BUN 21 8 - 23 mg/dL   Creatinine, Ser 0.91 0.44 - 1.00 mg/dL   Calcium 9.2 8.9 - 10.3 mg/dL   GFR calc non Af Amer 54 (L) >60 mL/min   GFR calc Af Amer >60 >60 mL/min   Anion gap 11 5 - 15    Comment: Performed at [facility], [address] [place],  [address]  CBC     Status: Abnormal   Collection Time: 07/05/19  4:19 AM  Result Value Ref Range   WBC 6.5 4.0 - 10.5 K/uL   RBC 4.16 3.87 - 5.11 MIL/uL   Hemoglobin 11.9 (L) 12.0 - 15.0 g/dL   HCT 37.5 36 - 46 %   MCV 90.1 80.0 - 100.0 fL   MCH 28.6 26.0 - 34.0 pg   MCHC 31.7 30.0 - 36.0 g/dL   RDW 16.7 (H) 11.5 - 15.5 %   Platelets 203 150 - 400 K/uL   nRBC 0.0 0.0 - 0.2 %  Comment: Performed at [facility], [address] [place], [place] [address]  APTT     Status: Abnormal   Collection Time: 07/05/19  4:19 AM  Result Value Ref Range   aPTT >160 (HH) 24 - 36 seconds    Comment: CRITICAL RESULT CALLED TO, READ BACK BY AND VERIFIED WITH:  KAT NARDOZZI AT 0616 07/05/19 SDR        IF BASELINE aPTT IS ELEVATED, SUGGEST PATIENT RISK ASSESSMENT BE USED TO DETERMINE APPROPRIATE ANTICOAGULANT THERAPY. Performed at [facility], [place], [place] [address]   Heparin level (unfractionated)     Status: Abnormal   Collection Time: 07/05/19  4:19 AM  Result Value Ref Range   Heparin Unfractionated >3.60 (H) 0.30 - 0.70 IU/mL    Comment: RESULTS CONFIRMED BY MANUAL DILUTION (NOTE) If heparin results are below expected values, and patient dosage has  been confirmed, suggest follow up testing of antithrombin III levels. Performed at [facility], [address] [place], [place] [address]   APTT     Status: Abnormal   Collection Time: 07/05/19  3:47 PM  Result Value Ref Range   aPTT >160 (HH) 24 - 36 seconds    Comment:        IF BASELINE aPTT IS ELEVATED, SUGGEST PATIENT RISK ASSESSMENT BE USED TO DETERMINE APPROPRIATE ANTICOAGULANT THERAPY. CRITICAL RESULT  CALLED TO, READ BACK BY AND VERIFIED WITH: CHRIS BENNETT RN @1636  07/05/2019 BY ACR Performed at [facility], [address] [place], [place] [address]   APTT     Status: Abnormal   Collection Time: 07/06/19  1:59 AM  Result Value Ref Range   aPTT 119 (H) 24 - 36 seconds    Comment:        IF BASELINE aPTT IS ELEVATED, SUGGEST PATIENT RISK ASSESSMENT BE USED TO DETERMINE APPROPRIATE ANTICOAGULANT THERAPY. Performed at [facility], [place], [place] [address]   Heparin level (unfractionated)     Status: Abnormal   Collection Time: 07/06/19  1:59 AM  Result Value Ref Range   Heparin Unfractionated >3.60 (H) 0.30 - 0.70 IU/mL    Comment: RESULTS CONFIRMED BY MANUAL DILUTION (NOTE) If heparin results are below expected values, and patient dosage has  been confirmed, suggest follow up testing of antithrombin III levels. Performed at [facility], [address] [place], [place] [address]   Comprehensive metabolic panel     Status: Abnormal   Collection Time: 07/06/19  1:59 AM  Result Value Ref Range   Sodium 138 135 - 145 mmol/L   Potassium 4.0 3.5 - 5.1 mmol/L   Chloride 97 (L) 98 - 111 mmol/L   CO2 30 22 - 32 mmol/L   Glucose, Bld 117 (H) 70 - 99 mg/dL    Comment: Glucose reference range applies only to samples taken after fasting for at least 8 hours.   BUN 21 8 - 23 mg/dL   Creatinine, Ser 1.04 (H) 0.44 - 1.00 mg/dL   Calcium 9.1 8.9 - 10.3 mg/dL   Total Protein 6.3 (L) 6.5 - 8.1 g/dL   Albumin 3.4 (L) 3.5 - 5.0 g/dL   AST 17 15 - 41 U/L   ALT 6 0 - 44 U/L   Alkaline Phosphatase 53 38 - 126 U/L   Total Bilirubin 0.9 0.3 - 1.2 mg/dL   GFR calc non Af Amer 46 (L) >60 mL/min   GFR calc Af Amer 54 (L) >60 mL/min  Anion gap 11 5 - 15    Comment: Performed at [facility] [address], [address] [place], [place] [address]  Magnesium     Status: None   Collection Time: 07/06/19  1:59 AM  Result Value Ref Range   Magnesium  2.1 1.7 - 2.4 mg/dL    Comment: Performed at [facility], [address] [place], [place] [address]  Phosphorus     Status: None   Collection Time: 07/06/19  1:59 AM  Result Value Ref Range   Phosphorus 3.4 2.5 - 4.6 mg/dL    Comment: Performed at [facility], [address] [place], [place] [address]  CBC with Differential/Platelet     Status: Abnormal   Collection Time: 07/06/19  1:59 AM  Result Value Ref Range   WBC 5.4 4.0 - 10.5 K/uL   RBC 3.88 3.87 - 5.11 MIL/uL   Hemoglobin 11.3 (L) 12.0 - 15.0 g/dL   HCT 36.0 36 - 46 %   MCV 92.8 80.0 - 100.0 fL   MCH 29.1 26.0 - 34.0 pg   MCHC 31.4 30.0 - 36.0 g/dL   RDW 16.7 (H) 11.5 - 15.5 %   Platelets 183 150 - 400 K/uL   nRBC 0.0 0.0 - 0.2 %   Neutrophils Relative % 71 %   Neutro Abs 3.9 1.7 - 7.7 K/uL   Lymphocytes Relative 10 %   Lymphs Abs 0.6 (L) 0.7 - 4.0 K/uL   Monocytes Relative 13 %   Monocytes Absolute 0.7 0 - 1 K/uL   Eosinophils Relative 4 %   Eosinophils Absolute 0.2 0 - 0 K/uL   Basophils Relative 1 %   Basophils Absolute 0.0 0 - 0 K/uL   Immature Granulocytes 1 %   Abs Immature Granulocytes 0.06 0.00 - 0.07 K/uL    Comment: Performed at [facility], [address] [place], [place] [address]  APTT     Status: Abnormal   Collection Time: 07/06/19  9:53 PM  Result Value Ref Range   aPTT 63 (H) 24 - 36 seconds    Comment:        IF BASELINE aPTT IS ELEVATED, SUGGEST PATIENT RISK ASSESSMENT BE USED TO DETERMINE APPROPRIATE ANTICOAGULANT THERAPY. Performed at [facility], [address] [place], [place] [address]   Comprehensive metabolic panel     Status: Abnormal   Collection Time: 07/07/19  8:00 AM  Result Value Ref Range   Sodium 140 135 - 145 mmol/L   Potassium 4.2 3.5 - 5.1 mmol/L   Chloride 99 98 - 111 mmol/L   CO2 31 22 - 32 mmol/L   Glucose, Bld 106 (H) 70 - 99 mg/dL    Comment: Glucose reference range applies only to samples taken after fasting for at  least 8 hours.   BUN 13 8 - 23 mg/dL   Creatinine, Ser 0.83 0.44 - 1.00 mg/dL   Calcium 9.1 8.9 - 10.3 mg/dL   Total Protein 6.2 (L) 6.5 - 8.1 g/dL   Albumin 3.3 (L) 3.5 - 5.0 g/dL   AST 16 15 - 41 U/L   ALT 6 0 - 44 U/L   Alkaline Phosphatase 50 38 - 126 U/L   Total Bilirubin 0.8 0.3 - 1.2 mg/dL   GFR calc non Af Amer >60 >60 mL/min   GFR calc Af Amer >60 >60 mL/min   Anion gap 10 5 - 15    Comment: Performed at [facility], [address],  [place], [place] [address]  Magnesium     Status: None   Collection Time: 07/07/19  8:00 AM  Result Value Ref Range   Magnesium 2.0 1.7 - 2.4 mg/dL    Comment: Performed at [facility], [address] [place], [place] [address]  Phosphorus     Status: None   Collection Time: 07/07/19  8:00 AM  Result Value Ref Range   Phosphorus 3.5 2.5 - 4.6 mg/dL    Comment: Performed at [facility], [address] [place], [place] [address]  CBC with Differential/Platelet     Status: Abnormal   Collection Time: 07/07/19  8:00 AM  Result Value Ref Range   WBC 5.2 4.0 - 10.5 K/uL   RBC 3.67 (L) 3.87 - 5.11 MIL/uL   Hemoglobin 10.6 (L) 12.0 - 15.0 g/dL   HCT 33.9 (L) 36 - 46 %   MCV 92.4 80.0 - 100.0 fL   MCH 28.9 26.0 - 34.0 pg   MCHC 31.3 30.0 - 36.0 g/dL   RDW 16.4 (H) 11.5 - 15.5 %   Platelets 179 150 - 400 K/uL   nRBC 0.0 0.0 - 0.2 %   Neutrophils Relative % 74 %   Neutro Abs 3.8 1.7 - 7.7 K/uL   Lymphocytes Relative 10 %   Lymphs Abs 0.5 (L) 0.7 - 4.0 K/uL   Monocytes Relative 12 %   Monocytes Absolute 0.6 0 - 1 K/uL   Eosinophils Relative 3 %   Eosinophils Absolute 0.2 0 - 0 K/uL   Basophils Relative 0 %   Basophils Absolute 0.0 0 - 0 K/uL   Immature Granulocytes 1 %   Abs Immature Granulocytes 0.07 0.00 - 0.07 K/uL    Comment: Performed at [facility], [address] [place], [place] [address]  Vitamin B12     Status: Abnormal   Collection Time: 07/07/19  8:00 AM  Result Value Ref Range    Vitamin B-12 3,183 (H) 180 - 914 pg/mL    Comment: (NOTE) This assay is not validated for testing neonatal or myeloproliferative syndrome specimens for Vitamin B12 levels. Performed at [facility], [place] [address] [place], [place] [address]   Folate     Status: Abnormal   Collection Time: 07/07/19  8:00 AM  Result Value Ref Range   Folate 5.9 (L) >5.9 ng/mL    Comment: Performed at [facility], [address] [place], [place] [address]  Iron and TIBC     Status: None   Collection Time: 07/07/19  8:00 AM  Result Value Ref Range   Iron 39 28 - 170 ug/dL   TIBC 291 250 - 450 ug/dL   Saturation Ratios 13 10.4 - 31.8 %   UIBC 252 ug/dL    Comment: Performed at [facility], [address] [place], [place] [address]  Ferritin     Status: None   Collection Time: 07/07/19  8:00 AM  Result Value Ref Range   Ferritin 110 11 - 307 ng/mL    Comment: Performed at [facility], [address] [place], [place] [address]  Reticulocytes     Status: Abnormal   Collection Time: 07/07/19  8:00 AM  Result Value Ref Range   Retic Ct Pct 2.6 0.4 - 3.1 %   RBC. 3.63 (L) 3.87 - 5.11 MIL/uL   Retic Count, Absolute 93.3 19.0 - 186.0 K/uL   Immature Retic Fract 13.0 2.3 - 15.9 %    Comment: Performed  at [facility], [address] [place], [place] [address]  APTT     Status: Abnormal   Collection Time: 07/07/19  8:00 AM  Result Value Ref Range   aPTT 46 (H) 24 - 36 seconds    Comment:        IF BASELINE aPTT IS ELEVATED, SUGGEST PATIENT RISK ASSESSMENT BE USED TO DETERMINE APPROPRIATE ANTICOAGULANT THERAPY. Performed at [facility], [address] [place], [place] [address]   Cytology - Non PAP;     Status: None   Collection Time: 07/07/19  4:00 PM  Result Value Ref Range   CYTOLOGY - NON GYN      CYTOLOGY - NON PAP CASE: [id number] PATIENT: Fransisca Tauzin Non-Gynecological Cytology Report     Specimen Submitted: A.  Pleural fluid, right  Clinical History: None provided    DIAGNOSIS: A. PLEURAL FLUID, RIGHT; ULTRASOUND-GUIDED THORACENTESIS: - POSITIVE FOR MALIGNANCY. - METASTATIC ADENOCARCINOMA, COMPATIBLE WITH PULMONARY PRIMARY, SIMILAR TO PREVIOUS ([id number]).  Slides reviewed: 1 ThinPrep, 1 cell block, 1 cytospin  GROSS DESCRIPTION: A. Labeled: Right pleural fluid Received: Fresh Volume: 1000 mL Description of fluid and container in which it is received: Received is red, cloudy fluid in a 1000 mL glass evacuated container. Cytospin slide(s) received: Yes, 1  Specimen material submitted for: Cell block and ThinPrep  The cell block material is fixed in formalin for 6 hours prior to processing.   Final Diagnosis performed by Allena Napoleon, MD.   Electronically signed 07/11/2019 2:37:33PM The electronic signature indicates that the named At tending Pathologist has evaluated the specimen Technical component performed at [facility], [address], [place], [place] [address] Lab: [phone] Dir: Rush Farmer, MD, MMM  Professional component performed at [facility], [address], [place], [place] [address] Lab: [phone] Dir: Dellia Nims. Rubinas, MD   Lactate dehydrogenase (pleural or peritoneal fluid)     Status: Abnormal   Collection Time: 07/07/19  4:00 PM  Result Value Ref Range   LD, Fluid 216 (H) 3 - 23 U/L    Comment: HEMOLYSIS AT THIS LEVEL MAY AFFECT RESULT (NOTE) Results should be evaluated in conjunction with serum values    Fluid Type-FLDH CYTO PERI     Comment: Performed at [facility], [address] [place], [place] [address]  Triglycerides, Body Fluid     Status: None   Collection Time: 07/07/19  4:00 PM  Result Value Ref Range   Triglycerides, Fluid 27 Not Estab. mg/dL    Comment: (NOTE) The reference intervals and other method performance specifications have not been established for this test. The test result should  be integrated into the clinical context for interpretation. The reference interval(s) and other method performance specifications have not been established for this body fluid. The test result must be integrated into the clinical context for interpretation. Performed At: [facility] [place], [place] [id number] Rush Farmer MD [id number]    Fluid Type-FTRIG CYTO PERI     Comment: Performed at [facility], [address] [place],  [address]  Body fluid cell count with differential     Status: Abnormal   Collection Time: 07/07/19  4:00 PM  Result Value Ref Range   Fluid Type-FCT CYTO PERI    Color, Fluid RED (A) YELLOW   Appearance, Fluid TURBID (A) CLEAR   Total Nucleated Cell Count, Fluid 195 cu mm   Neutrophil Count, Fluid 7 %   Lymphs, Fluid 74 %   Monocyte-Macrophage-Serous  Fluid 19 %   Eos, Fluid 0 %    Comment: Performed at [facility], [address] [place], [place] [address]  Body fluid culture     Status: None   Collection Time: 07/07/19  4:00 PM   Specimen: PATH Cytology Peritoneal fluid  Result Value Ref Range   Specimen Description      PERITONEAL Performed at [facility], [address], [place], [place] [address]    Special Requests      NONE Performed at [facility], [place], [place] [address]    Gram Stain      RARE WBC PRESENT,BOTH PMN AND MONONUCLEAR NO ORGANISMS SEEN    Culture      NO GROWTH 3 DAYS Performed at [facility], [place] [address] [place], [place] [address]    Report Status 07/10/2019 FINAL   Albumin, pleural or peritoneal fluid     Status: None   Collection Time: 07/07/19  4:00 PM  Result Value Ref Range   Albumin, Fluid 2.6 g/dL    Comment: (NOTE) No normal range established for this test Results should be evaluated in conjunction with serum values    Fluid Type-FALB CYTO PERI     Comment: Performed at [facility], [address] [place], [place] [address]  Protein, pleural or peritoneal fluid     Status: None   Collection Time: 07/07/19  4:00 PM  Result Value Ref Range   Total protein, fluid 3.6 g/dL    Comment: (NOTE) No normal range established for this test Results should be evaluated in conjunction with serum values    Fluid Type-FTP CYTO PERI     Comment: Performed at [facility], [address] [place], [place] [address]  Glucose, pleural or peritoneal fluid     Status: None   Collection Time: 07/07/19  4:00 PM  Result Value Ref Range   Glucose, Fluid 111 mg/dL    Comment: (NOTE) No normal range established for this test Results should be evaluated in conjunction with serum values    Fluid Type-FGLU CYTO PERI     Comment: Performed at [facility], [address] [place], [place] [address]  [facility], Body Fluid     Status: None   Collection Time: 07/07/19  4:00 PM  Result Value Ref Range   pH, Body Fluid 7.4 Not Estab.    Comment: (NOTE) This test was developed and its performance characteristics determined by [facility]. It has not been cleared or approved by the Food and Drug Administration. The reference interval(s) and other method performance specifications have not been established for this body fluid. The test result must be integrated into the clinical context for interpretation. Performed At: [facility] [place], [place] [id number] Rush Farmer MD [id number]    Source PERITONEAL     Comment: Performed at [facility], [address] [place],  [address]  Comprehensive metabolic panel     Status: Abnormal   Collection Time: 07/08/19  6:15 AM  Result Value Ref Range   Sodium 138 135 - 145 mmol/L   Potassium 3.9 3.5 - 5.1 mmol/L   Chloride 97 (L) 98 - 111 mmol/L   CO2 31 22 - 32 mmol/L   Glucose, Bld 106 (H) 70 - 99 mg/dL    Comment: Glucose reference range applies only to samples taken after fasting for at least  8 hours.   BUN 12 8 -  23 mg/dL   Creatinine, Ser 0.96 0.44 - 1.00 mg/dL   Calcium 9.0 8.9 - 10.3 mg/dL   Total Protein 5.8 (L) 6.5 - 8.1 g/dL   Albumin 3.2 (L) 3.5 - 5.0 g/dL   AST 17 15 - 41 U/L   ALT 5 0 - 44 U/L   Alkaline Phosphatase 49 38 - 126 U/L   Total Bilirubin 0.8 0.3 - 1.2 mg/dL   GFR calc non Af Amer 51 (L) >60 mL/min   GFR calc Af Amer 59 (L) >60 mL/min   Anion gap 10 5 - 15    Comment: Performed at [facility], [address] [place], [place] [address]  Magnesium     Status: None   Collection Time: 07/08/19  6:15 AM  Result Value Ref Range   Magnesium 1.8 1.7 - 2.4 mg/dL    Comment: Performed at [facility], [address] [place], [place] [address]  Phosphorus     Status: None   Collection Time: 07/08/19  6:15 AM  Result Value Ref Range   Phosphorus 3.5 2.5 - 4.6 mg/dL    Comment: Performed at [facility], [address] [place], [place] [address]  CBC with Differential/Platelet     Status: Abnormal   Collection Time: 07/08/19  6:15 AM  Result Value Ref Range   WBC 5.7 4.0 - 10.5 K/uL   RBC 3.58 (L) 3.87 - 5.11 MIL/uL   Hemoglobin 10.6 (L) 12.0 - 15.0 g/dL   HCT 32.6 (L) 36 - 46 %   MCV 91.1 80.0 - 100.0 fL   MCH 29.6 26.0 - 34.0 pg   MCHC 32.5 30.0 - 36.0 g/dL   RDW 16.1 (H) 11.5 - 15.5 %   Platelets 168 150 - 400 K/uL   nRBC 0.0 0.0 - 0.2 %   Neutrophils Relative % 79 %   Neutro Abs 4.5 1.7 - 7.7 K/uL   Lymphocytes Relative 7 %   Lymphs Abs 0.4 (L) 0.7 - 4.0 K/uL   Monocytes Relative 10 %   Monocytes Absolute 0.6 0 - 1 K/uL   Eosinophils Relative 3 %   Eosinophils Absolute 0.2 0 - 0 K/uL   Basophils Relative 0 %   Basophils Absolute 0.0 0 - 0 K/uL   Immature Granulocytes 1 %   Abs Immature Granulocytes 0.05 0.00 - 0.07 K/uL    Comment: Performed at [facility] [address] [place], [place] [address]  Comprehensive metabolic panel     Status: Abnormal   Collection Time: 07/09/19  5:49 AM  Result  Value Ref Range   Sodium 139 135 - 145 mmol/L   Potassium 3.7 3.5 - 5.1 mmol/L   Chloride 97 (L) 98 - 111 mmol/L   CO2 31 22 - 32 mmol/L   Glucose, Bld 100 (H) 70 - 99 mg/dL    Comment: Glucose reference range applies only to samples taken after fasting for at least 8 hours.   BUN 12 8 - 23 mg/dL   Creatinine, Ser 0.75 0.44 - 1.00 mg/dL   Calcium 8.9 8.9 - 10.3 mg/dL   Total Protein 5.6 (L) 6.5 - 8.1 g/dL   Albumin 3.0 (L) 3.5 - 5.0 g/dL   AST 14 (L) 15 - 41 U/L   ALT <5 0 - 44 U/L   Alkaline Phosphatase 45 38 - 126 U/L   Total Bilirubin 0.8 0.3 - 1.2 mg/dL   GFR calc non Af Amer >60 >60 mL/min  GFR calc Af Amer >60 >60 mL/min   Anion gap 11 5 - 15    Comment: Performed at [facility], [address] [place], [place] [address]  Magnesium     Status: None   Collection Time: 07/09/19  5:49 AM  Result Value Ref Range   Magnesium 1.7 1.7 - 2.4 mg/dL    Comment: Performed at [facility], [address] [place], [place] [address]  Phosphorus     Status: None   Collection Time: 07/09/19  5:49 AM  Result Value Ref Range   Phosphorus 3.8 2.5 - 4.6 mg/dL    Comment: Performed at [facility], [address] [place], [place] [address]  CBC with Differential/Platelet     Status: Abnormal   Collection Time: 07/09/19  5:49 AM  Result Value Ref Range   WBC 5.2 4.0 - 10.5 K/uL   RBC 3.47 (L) 3.87 - 5.11 MIL/uL   Hemoglobin 10.0 (L) 12.0 - 15.0 g/dL   HCT 31.3 (L) 36 - 46 %   MCV 90.2 80.0 - 100.0 fL   MCH 28.8 26.0 - 34.0 pg   MCHC 31.9 30.0 - 36.0 g/dL   RDW 15.9 (H) 11.5 - 15.5 %   Platelets 176 150 - 400 K/uL   nRBC 0.0 0.0 - 0.2 %   Neutrophils Relative % 73 %   Neutro Abs 3.8 1.7 - 7.7 K/uL   Lymphocytes Relative 10 %   Lymphs Abs 0.5 (L) 0.7 - 4.0 K/uL   Monocytes Relative 12 %   Monocytes Absolute 0.6 0 - 1 K/uL   Eosinophils Relative 4 %   Eosinophils Absolute 0.2 0 - 0 K/uL   Basophils Relative 0 %   Basophils Absolute 0.0 0 - 0 K/uL    Immature Granulocytes 1 %   Abs Immature Granulocytes 0.04 0.00 - 0.07 K/uL    Comment: Performed at [facility], [address] [place], [place] [address]  SARS Coronavirus 2 by RT PCR (hospital order, performed in [facility] hospital lab) Nasopharyngeal Nasopharyngeal Swab     Status: None   Collection Time: 07/10/19 11:42 AM   Specimen: Nasopharyngeal Swab  Result Value Ref Range   SARS Coronavirus 2 NEGATIVE NEGATIVE    Comment: (NOTE) SARS-CoV-2 target nucleic acids are NOT DETECTED. The SARS-CoV-2 RNA is generally detectable in upper and lower respiratory specimens during the acute phase of infection. The lowest concentration of SARS-CoV-2 viral copies this assay can detect is 250 copies / mL. A negative result does not preclude SARS-CoV-2 infection and should not be used as the sole basis for treatment or other patient management decisions.  A negative result may occur with improper specimen collection / handling, submission of specimen other than nasopharyngeal swab, presence of viral mutation(s) within the areas targeted by this assay, and inadequate number of viral copies (<250 copies / mL). A negative result must be combined with clinical observations, patient history, and epidemiological information. Fact Sheet for Patients:   StrictlyIdeas.no Fact Sheet for Healthcare Providers: BankingDealers.co.za This test is not yet approved or cleared  by the [place] FDA and has been authorized for detection and/or diagnosis of SARS-CoV-2 by FDA under an Emergency Use Authorization (EUA).  This EUA will remain in effect (meaning this test can be used) for the duration of the COVID-19 declaration under Section 564(b)(1) of the Act, 21 U.S.C. section 360bbb-3(b)(1), unless the authorization is terminated or revoked sooner. Performed at [facility] [facility], [address] [place], [place] [address]   Basic metabolic  panel     Status: Abnormal   Collection Time: 07/19/19  9:20 AM  Result Value Ref Range   Sodium 136 135 - 145 mmol/L   Potassium 3.9 3.5 - 5.1 mmol/L   Chloride 98 98 - 111 mmol/L   CO2 27 22 - 32 mmol/L   Glucose, Bld 121 (H) 70 - 99 mg/dL    Comment: Glucose reference range applies only to samples taken after fasting for at least 8 hours.   BUN 14 8 - 23 mg/dL   Creatinine, Ser 0.98 0.44 - 1.00 mg/dL   Calcium 8.9 8.9 - 10.3 mg/dL   GFR calc non Af Amer 50 (L) >60 mL/min   GFR calc Af Amer 58 (L) >60 mL/min   Anion gap 11 5 - 15    Comment: Performed at [facility], [address] [place], [place] [address]  CBC with Differential     Status: Abnormal   Collection Time: 07/19/19  9:20 AM  Result Value Ref Range   WBC 5.8 4.0 - 10.5 K/uL   RBC 3.72 (L) 3.87 - 5.11 MIL/uL   Hemoglobin 10.8 (L) 12.0 - 15.0 g/dL   HCT 33.9 (L) 36 - 46 %   MCV 91.1 80.0 - 100.0 fL   MCH 29.0 26.0 - 34.0 pg   MCHC 31.9 30.0 - 36.0 g/dL   RDW 15.5 11.5 - 15.5 %   Platelets 247 150 - 400 K/uL   nRBC 0.0 0.0 - 0.2 %   Neutrophils Relative % 77 %   Neutro Abs 4.5 1.7 - 7.7 K/uL   Lymphocytes Relative 9 %   Lymphs Abs 0.5 (L) 0.7 - 4.0 K/uL   Monocytes Relative 10 %   Monocytes Absolute 0.6 0 - 1 K/uL   Eosinophils Relative 3 %   Eosinophils Absolute 0.2 0 - 0 K/uL   Basophils Relative 0 %   Basophils Absolute 0.0 0 - 0 K/uL   Immature Granulocytes 1 %   Abs Immature Granulocytes 0.05 0.00 - 0.07 K/uL    Comment: Performed at [facility], [address] [place], [place] [address]  Thyroid Panel With TSH     Status: Abnormal   Collection Time: 07/19/19  9:25 AM  Result Value Ref Range   TSH 6.190 (H) 0.450 - 4.500 uIU/mL   T4, Total 8.4 4.5 - 12.0 ug/dL   T3 Uptake Ratio 26 24 - 39 %   Free Thyroxine Index 2.2 1.2 - 4.9    Comment: (NOTE) Performed At: [facility] [place], [place] [id number] Rush Farmer MD OZ:[id number]   SARS CORONAVIRUS 2 (TAT  6-24 HRS) Nasopharyngeal Nasopharyngeal Swab     Status: None   Collection Time: 07/21/19  9:37 AM   Specimen: Nasopharyngeal Swab  Result Value Ref Range   SARS Coronavirus 2 NEGATIVE NEGATIVE    Comment: (NOTE) SARS-CoV-2 target nucleic acids are NOT DETECTED.  The SARS-CoV-2 RNA is generally detectable in upper and lower respiratory specimens during the acute phase of infection. Negative results do not preclude SARS-CoV-2 infection, do not rule out co-infections with other pathogens, and should not be used as the sole basis for treatment or other patient management decisions. Negative results must be combined with clinical observations, patient history, and epidemiological information. The expected result is Negative.  Fact Sheet for Patients: SugarRoll.be  Fact Sheet for Healthcare Providers: https://www.woods-mathews.com/  This test is not yet approved  or cleared by the [place] and  has been authorized for detection and/or diagnosis of SARS-CoV-2 by FDA under an Emergency Use Authorization (EUA). This EUA will remain  in effect (meaning this test can be used) for the duration of the COVID-19 declaration under Se ction 564(b)(1) of the Act, 21 U.S.C. section 360bbb-3(b)(1), unless the authorization is terminated or revoked sooner.  Performed at [facility], [place] [address] [place], [place] [address]   SARS CORONAVIRUS 2 (TAT 6-24 HRS) Nasopharyngeal Nasopharyngeal Swab     Status: None   Collection Time: 08/03/19 11:25 AM   Specimen: Nasopharyngeal Swab  Result Value Ref Range   SARS Coronavirus 2 NEGATIVE NEGATIVE    Comment: (NOTE) SARS-CoV-2 target nucleic acids are NOT DETECTED.  The SARS-CoV-2 RNA is generally detectable in upper and lower respiratory specimens during the acute phase of infection. Negative results do not preclude SARS-CoV-2 infection, do not rule out co-infections with other pathogens, and  should not be used as the sole basis for treatment or other patient management decisions. Negative results must be combined with clinical observations, patient history, and epidemiological information. The expected result is Negative.  Fact Sheet for Patients: SugarRoll.be  Fact Sheet for Healthcare Providers: https://www.woods-mathews.com/  This test is not yet approved or cleared by the [place] FDA and  has been authorized for detection and/or diagnosis of SARS-CoV-2 by FDA under an Emergency Use Authorization (EUA). This EUA will remain  in effect (meaning this test can be used) for the duration of the COVID-19 declaration under Se ction 564(b)(1) of the Act, 21 U.S.C. section 360bbb-3(b)(1), unless the authorization is terminated or revoked sooner.  Performed at [facility], [place] [address] [place], [place] [address]     Radiology: [facility] Chest Port 1 View  Result Date: 08/04/2019 CLINICAL DATA:  Post thoracentesis. EXAM: PORTABLE CHEST 1 VIEW COMPARISON:  07/22/2019. FINDINGS: Mediastinum hilar structures normal. Cardiomegaly. Stable mild bilateral interstitial prominence. Tiny right pleural effusion. No prominent effusion noted. No pneumothorax post thoracentesis. IMPRESSION: No pneumothorax post thoracentesis. Electronically Signed   By: Marcello Moores  Register   On: 08/04/2019 14:06   US THORACENTESIS ASP PLEURAL SPACE W/IMG GUIDE  Result Date: 08/04/2019 INDICATION: Patient with history of adenocarcinoma of the lung, left breast cancer status post mastectomy with recurrent right malignant pleural effusion. Request to IR for therapeutic thoracentesis. EXAM: ULTRASOUND GUIDED RIGHT THORACENTESIS MEDICATIONS: 10 mL 1% lidocaine COMPLICATIONS: None immediate. PROCEDURE: An ultrasound guided thoracentesis was thoroughly discussed with the patient and questions answered. The benefits, risks, alternatives and complications were also discussed.  The patient understands and wishes to proceed with the procedure. Written consent was obtained. Ultrasound was performed to localize and mark an adequate pocket of fluid in the right chest. The area was then prepped and draped in the normal sterile fashion. 1% Lidocaine was used for local anesthesia. Under ultrasound guidance a 6 Fr Safe-T-Centesis catheter was introduced. Thoracentesis was performed. The catheter was removed and a dressing applied. FINDINGS: A total of approximately 1.0 L of serosanguineous fluid was removed. IMPRESSION: Successful ultrasound guided right thoracentesis yielding 1.0 L of pleural fluid. Read by Candiss Norse, PA-C Electronically Signed   By: Marcello Moores  Register   On: 08/04/2019 13:37    No results found.  DG Abd 1 View  Result Date: 08/02/2019 CLINICAL DATA:  Acute abdominal pain EXAM: ABDOMEN - 1 VIEW COMPARISON:  Chest radiograph July 22, 2019; PET-CT Jun 09, 2019 FINDINGS: Upright images obtained. There is  moderate stool in the colon. There is no bowel dilatation or air-fluid level to suggest bowel obstruction. No free air. There is asymmetric soft tissue opacity lateral to the left iliac crest of uncertain etiology. There is postoperative change in the area of known abdominal aortic aneurysm with stent in this area. This area appears grossly stable. Right pleural effusion with apparent right base mass lesion again noted. IMPRESSION: 1.  No bowel obstruction or free air. 2. Asymmetric soft tissue opacity lateral to the left iliac crest of uncertain etiology. CT potentially could be helpful to further evaluate this area if there is concern for pathology in this region. 3. Stent placement through known abdominal aortic aneurysm again noted. 4. Right pleural effusion with apparent mass right base, documented on previous PET study. Electronically Signed   By: Lowella Grip III M.D.   On: 08/02/2019 14:43   DG Chest Port 1 View  Result Date: 08/04/2019 CLINICAL DATA:   Post thoracentesis. EXAM: PORTABLE CHEST 1 VIEW COMPARISON:  07/22/2019. FINDINGS: Mediastinum hilar structures normal. Cardiomegaly. Stable mild bilateral interstitial prominence. Tiny right pleural effusion. No prominent effusion noted. No pneumothorax post thoracentesis. IMPRESSION: No pneumothorax post thoracentesis. Electronically Signed   By: Marcello Moores  Register   On: 08/04/2019 14:06   US THORACENTESIS ASP PLEURAL SPACE W/IMG GUIDE  Result Date: 08/04/2019 INDICATION: Patient with history of adenocarcinoma of the lung, left breast cancer status post mastectomy with recurrent right malignant pleural effusion. Request to IR for therapeutic thoracentesis. EXAM: ULTRASOUND GUIDED RIGHT THORACENTESIS MEDICATIONS: 10 mL 1% lidocaine COMPLICATIONS: None immediate. PROCEDURE: An ultrasound guided thoracentesis was thoroughly discussed with the patient and questions answered. The benefits, risks, alternatives and complications were also discussed. The patient understands and wishes to proceed with the procedure. Written consent was obtained. Ultrasound was performed to localize and mark an adequate pocket of fluid in the right chest. The area was then prepped and draped in the normal sterile fashion. 1% Lidocaine was used for local anesthesia. Under ultrasound guidance a 6 Fr Safe-T-Centesis catheter was introduced. Thoracentesis was performed. The catheter was removed and a dressing applied. FINDINGS: A total of approximately 1.0 L of serosanguineous fluid was removed. IMPRESSION: Successful ultrasound guided right thoracentesis yielding 1.0 L of pleural fluid. Read by Candiss Norse, PA-C Electronically Signed   By: Marcello Moores  Register   On: 08/04/2019 13:37      Assessment and Plan: Patient Active Problem List   Diagnosis Date Noted  . Palliative care encounter   . DVT (deep venous thrombosis) ([place]) 07/04/2019  . Goals of care, counseling/discussion 06/13/2019  . Hematoma 06/10/2019  . Primary malignant  neoplasm of right lower lobe of lung ([place]) 04/01/2019  . Neoplasm of uncertain behavior of right lower lobe of lung 03/16/2019  . Acute upper respiratory infection 12/08/2018  . Vasomotor rhinitis 12/08/2018  . Cough 09/16/2018  . Dependence on nocturnal oxygen therapy 07/23/2018  . SOB (shortness of breath) 07/23/2018  . Lower extremity pain, bilateral 02/24/2018  . Encounter for general adult medical examination with abnormal findings 02/22/2018  . 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 Bilateral lower extremity edema 02/22/2018  . Dysuria 02/22/2018  . AAA (abdominal aortic aneurysm) without rupture ([facility]) 08/23/2017  . Lymphedema 08/23/2017  . Chest pain 07/26/2017  . Tinea corporis 07/26/2017  . Obstructive chronic bronchitis without exacerbation ([place]) 02/19/2017  . Emphysema, unspecified ([place]) 02/19/2017  . Chronic obstructive pulmonary disease ([place]) 03/28/2016  .  Benign hypertension 03/28/2016    1. Malignant neoplasm of right lung, unspecified part of lung ([facility]) Ongoing pain control with morphine IR 15mg  q 4 hours.  Patient is doing well with this, and will continue to see her weekly.   2. Chronic pain due to malignant neoplastic disease Morphine refilled.   3. Chronic obstructive pulmonary disease, unspecified COPD type ([place]) Stable continue to use medications as before  4. Sacral decubitus ulcer, stage III ([place]) Rx for for dubderm patches and other supplies to cover and dress wound.    General Counseling: I have discussed the findings of the evaluation and examination with Fey.  I have also discussed any further diagnostic evaluation thatmay be needed or ordered today. Davine verbalizes understanding of the findings of todays visit. We also reviewed her medications today and discussed drug interactions and side effects including but not limited excessive drowsiness and  altered mental states. We also discussed that there is always a risk not just to her but also people around her. she has been encouraged to call the office with any questions or concerns that should arise related to todays visit.  No orders of the defined types were placed in this encounter.    Time spent: 30  I have personally obtained a history, examined the patient, evaluated laboratory and imaging results, formulated the assessment and plan and placed orders.    Allyne Gee, MD [facility] Pulmonary and Critical Care Sleep medicine

## 2019-08-30 ENCOUNTER — Ambulatory Visit (INDEPENDENT_AMBULATORY_CARE_PROVIDER_SITE_OTHER): Payer: Medicare Other | Admitting: Vascular Surgery

## 2019-08-30 ENCOUNTER — Other Ambulatory Visit (INDEPENDENT_AMBULATORY_CARE_PROVIDER_SITE_OTHER): Payer: Medicare Other

## 2019-08-30 ENCOUNTER — Encounter (INDEPENDENT_AMBULATORY_CARE_PROVIDER_SITE_OTHER): Payer: Medicare Other

## 2019-08-30 DIAGNOSIS — J910 Malignant pleural effusion: Secondary | ICD-10-CM | POA: Diagnosis not present

## 2019-08-30 DIAGNOSIS — K5900 Constipation, unspecified: Secondary | ICD-10-CM | POA: Diagnosis not present

## 2019-08-30 DIAGNOSIS — J449 Chronic obstructive pulmonary disease, unspecified: Secondary | ICD-10-CM | POA: Diagnosis not present

## 2019-08-30 DIAGNOSIS — I10 Essential (primary) hypertension: Secondary | ICD-10-CM | POA: Diagnosis not present

## 2019-08-30 DIAGNOSIS — L7622 Postprocedural hemorrhage and hematoma of skin and subcutaneous tissue following other procedure: Secondary | ICD-10-CM | POA: Diagnosis not present

## 2019-08-30 DIAGNOSIS — Z87891 Personal history of nicotine dependence: Secondary | ICD-10-CM | POA: Diagnosis not present

## 2019-08-30 DIAGNOSIS — C3491 Malignant neoplasm of unspecified part of right bronchus or lung: Secondary | ICD-10-CM | POA: Diagnosis not present

## 2019-08-30 DIAGNOSIS — Z923 Personal history of irradiation: Secondary | ICD-10-CM | POA: Diagnosis not present

## 2019-08-30 DIAGNOSIS — I82412 Acute embolism and thrombosis of left femoral vein: Secondary | ICD-10-CM | POA: Diagnosis not present

## 2019-08-30 DIAGNOSIS — Z853 Personal history of malignant neoplasm of breast: Secondary | ICD-10-CM | POA: Diagnosis not present

## 2019-08-30 DIAGNOSIS — I82812 Embolism and thrombosis of superficial veins of left lower extremities: Secondary | ICD-10-CM | POA: Diagnosis not present

## 2019-09-01 ENCOUNTER — Other Ambulatory Visit: Payer: Self-pay | Admitting: Adult Health

## 2019-09-01 ENCOUNTER — Telehealth: Payer: Self-pay

## 2019-09-01 DIAGNOSIS — G893 Neoplasm related pain (acute) (chronic): Secondary | ICD-10-CM

## 2019-09-01 DIAGNOSIS — K5900 Constipation, unspecified: Secondary | ICD-10-CM | POA: Diagnosis not present

## 2019-09-01 DIAGNOSIS — Z87891 Personal history of nicotine dependence: Secondary | ICD-10-CM | POA: Diagnosis not present

## 2019-09-01 DIAGNOSIS — J910 Malignant pleural effusion: Secondary | ICD-10-CM | POA: Diagnosis not present

## 2019-09-01 DIAGNOSIS — C3491 Malignant neoplasm of unspecified part of right bronchus or lung: Secondary | ICD-10-CM | POA: Diagnosis not present

## 2019-09-01 DIAGNOSIS — L7622 Postprocedural hemorrhage and hematoma of skin and subcutaneous tissue following other procedure: Secondary | ICD-10-CM | POA: Diagnosis not present

## 2019-09-01 DIAGNOSIS — Z853 Personal history of malignant neoplasm of breast: Secondary | ICD-10-CM | POA: Diagnosis not present

## 2019-09-01 DIAGNOSIS — J449 Chronic obstructive pulmonary disease, unspecified: Secondary | ICD-10-CM | POA: Diagnosis not present

## 2019-09-01 DIAGNOSIS — I82812 Embolism and thrombosis of superficial veins of left lower extremities: Secondary | ICD-10-CM | POA: Diagnosis not present

## 2019-09-01 DIAGNOSIS — I82412 Acute embolism and thrombosis of left femoral vein: Secondary | ICD-10-CM | POA: Diagnosis not present

## 2019-09-01 DIAGNOSIS — I10 Essential (primary) hypertension: Secondary | ICD-10-CM | POA: Diagnosis not present

## 2019-09-01 DIAGNOSIS — Z923 Personal history of irradiation: Secondary | ICD-10-CM | POA: Diagnosis not present

## 2019-09-01 MED ORDER — MORPHINE SULFATE 15 MG PO TABS: 15.0000 mg | ORAL_TABLET | ORAL | 0 refills | Status: DC | PRN

## 2019-09-01 NOTE — Progress Notes (Signed)
Sent RX for 30 tabs of morphine for pain mgmt for lung ca

## 2019-09-01 NOTE — Telephone Encounter (Signed)
Confirmed appointment on 09/05/2019. klh

## 2019-09-05 ENCOUNTER — Telehealth: Payer: Self-pay | Admitting: *Deleted

## 2019-09-05 ENCOUNTER — Other Ambulatory Visit: Payer: Medicare Other | Admitting: Nurse Practitioner

## 2019-09-05 ENCOUNTER — Encounter: Payer: Self-pay | Admitting: Nurse Practitioner

## 2019-09-05 ENCOUNTER — Other Ambulatory Visit: Payer: Self-pay | Admitting: *Deleted

## 2019-09-05 ENCOUNTER — Encounter: Payer: Self-pay | Admitting: Adult Health

## 2019-09-05 ENCOUNTER — Other Ambulatory Visit: Payer: Self-pay

## 2019-09-05 ENCOUNTER — Ambulatory Visit (INDEPENDENT_AMBULATORY_CARE_PROVIDER_SITE_OTHER): Payer: Medicare Other | Admitting: Adult Health

## 2019-09-05 ENCOUNTER — Other Ambulatory Visit
Admission: RE | Admit: 2019-09-05 | Discharge: 2019-09-05 | Disposition: A | Discharge status: Home / Self Care | Payer: Medicare Other | Source: Ambulatory Visit | Attending: Internal Medicine | Admitting: Internal Medicine

## 2019-09-05 VITALS — BP 138/81 | HR 83 | Temp 97.4°F | Resp 16 | Ht 63.0 in | Wt 180.0 lb

## 2019-09-05 DIAGNOSIS — Z515 Encounter for palliative care: Secondary | ICD-10-CM | POA: Diagnosis not present

## 2019-09-05 DIAGNOSIS — G893 Neoplasm related pain (acute) (chronic): Secondary | ICD-10-CM

## 2019-09-05 DIAGNOSIS — Z87891 Personal history of nicotine dependence: Secondary | ICD-10-CM | POA: Diagnosis not present

## 2019-09-05 DIAGNOSIS — C3431 Malignant neoplasm of lower lobe, right bronchus or lung: Secondary | ICD-10-CM | POA: Diagnosis not present

## 2019-09-05 DIAGNOSIS — L89153 Pressure ulcer of sacral region, stage 3: Secondary | ICD-10-CM | POA: Diagnosis not present

## 2019-09-05 DIAGNOSIS — C3491 Malignant neoplasm of unspecified part of right bronchus or lung: Secondary | ICD-10-CM | POA: Diagnosis not present

## 2019-09-05 DIAGNOSIS — L7622 Postprocedural hemorrhage and hematoma of skin and subcutaneous tissue following other procedure: Secondary | ICD-10-CM | POA: Diagnosis not present

## 2019-09-05 DIAGNOSIS — J90 Pleural effusion, not elsewhere classified: Secondary | ICD-10-CM

## 2019-09-05 DIAGNOSIS — J449 Chronic obstructive pulmonary disease, unspecified: Secondary | ICD-10-CM | POA: Diagnosis not present

## 2019-09-05 DIAGNOSIS — J910 Malignant pleural effusion: Secondary | ICD-10-CM | POA: Diagnosis not present

## 2019-09-05 DIAGNOSIS — Z01812 Encounter for preprocedural laboratory examination: Secondary | ICD-10-CM | POA: Insufficient documentation

## 2019-09-05 DIAGNOSIS — I82412 Acute embolism and thrombosis of left femoral vein: Secondary | ICD-10-CM | POA: Diagnosis not present

## 2019-09-05 DIAGNOSIS — R0689 Other abnormalities of breathing: Secondary | ICD-10-CM | POA: Diagnosis not present

## 2019-09-05 DIAGNOSIS — I10 Essential (primary) hypertension: Secondary | ICD-10-CM | POA: Diagnosis not present

## 2019-09-05 DIAGNOSIS — I82812 Embolism and thrombosis of superficial veins of left lower extremities: Secondary | ICD-10-CM | POA: Diagnosis not present

## 2019-09-05 DIAGNOSIS — Z20822 Contact with and (suspected) exposure to covid-19: Secondary | ICD-10-CM | POA: Insufficient documentation

## 2019-09-05 DIAGNOSIS — K5900 Constipation, unspecified: Secondary | ICD-10-CM | POA: Diagnosis not present

## 2019-09-05 DIAGNOSIS — Z923 Personal history of irradiation: Secondary | ICD-10-CM | POA: Diagnosis not present

## 2019-09-05 DIAGNOSIS — Z853 Personal history of malignant neoplasm of breast: Secondary | ICD-10-CM | POA: Diagnosis not present

## 2019-09-05 NOTE — Progress Notes (Signed)
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 10 5 - 15    Comment: Performed at [facility], [address] [place], [place] [address]  Magnesium     Status: None   Collection Time: 07/07/19  8:00 AM  Result Value Ref Range   Magnesium 2.0 1.7 - 2.4 mg/dL    Comment: Performed at [facility], [address] [place], [place] [address]  Phosphorus     Status: None   Collection Time: 07/07/19  8:00 AM  Result Value Ref Range   Phosphorus 3.5 2.5 - 4.6 mg/dL    Comment: Performed at [facility], [address] [place], [place] [address]  CBC with Differential/Platelet     Status: Abnormal   Collection Time: 07/07/19  8:00 AM  Result Value Ref Range   WBC 5.2 4.0 - 10.5 K/uL   RBC 3.67 (L) 3.87 - 5.11 MIL/uL   Hemoglobin 10.6 (L) 12.0 - 15.0 g/dL   HCT 33.9 (L) 36 - 46 %   MCV 92.4 80.0 - 100.0 fL   MCH 28.9 26.0 - 34.0 pg   MCHC 31.3 30.0 - 36.0 g/dL   RDW 16.4 (H) 11.5 - 15.5 %   Platelets 179 150 - 400 K/uL   nRBC 0.0 0.0 - 0.2 %   Neutrophils Relative % 74 %   Neutro Abs 3.8 1.7 - 7.7 K/uL   Lymphocytes Relative 10 %   Lymphs Abs 0.5 (L) 0.7 - 4.0 K/uL   Monocytes Relative 12 %   Monocytes Absolute 0.6 0 - 1 K/uL   Eosinophils Relative 3 %   Eosinophils Absolute 0.2 0 - 0 K/uL   Basophils Relative 0 %   Basophils Absolute 0.0 0 - 0 K/uL   Immature Granulocytes 1 %   Abs Immature Granulocytes 0.07 0.00 - 0.07 K/uL    Comment: Performed at [facility], [address] [place],  [address]  Vitamin B12     Status: Abnormal   Collection Time: 07/07/19  8:00 AM  Result Value Ref Range   Vitamin B-12 3,183 (H) 180 - 914 pg/mL    Comment: (NOTE) This assay is not validated for testing neonatal or myeloproliferative syndrome specimens for Vitamin B12 levels. Performed at [facility], [place] [address], [place], [place] [address]   Folate     Status: Abnormal   Collection Time:  07/07/19  8:00 AM  Result Value Ref Range   Folate 5.9 (L) >5.9 ng/mL    Comment: Performed at [facility], [address] [place], [place] [address]  Iron and TIBC     Status: None   Collection Time: 07/07/19  8:00 AM  Result Value Ref Range   Iron 39 28 - 170 ug/dL   TIBC 291 250 - 450 ug/dL   Saturation Ratios 13 10.4 - 31.8 %   UIBC 252 ug/dL    Comment: Performed at [facility], [address] [place], [place] [address]  Ferritin     Status: None   Collection Time: 07/07/19  8:00 AM  Result Value Ref Range   Ferritin 110 11 - 307 ng/mL    Comment: Performed at [facility], [address] [place], [place] [address]  Reticulocytes     Status: Abnormal   Collection Time: 07/07/19  8:00 AM  Result Value Ref Range   Retic Ct Pct 2.6 0.4 - 3.1 %   RBC. 3.63 (L) 3.87 - 5.11 MIL/uL   Retic Count, Absolute 93.3 19.0 - 186.0 K/uL   Immature Retic Fract 13.0 2.3 - 15.9 %    Comment: Performed at [facility], [address] [place], [place] [address]  APTT     Status: Abnormal   Collection Time: 07/07/19  8:00 AM  Result Value Ref Range   aPTT 46 (H) 24 - 36 seconds    Comment:        IF BASELINE aPTT IS ELEVATED, SUGGEST PATIENT RISK ASSESSMENT BE USED TO DETERMINE APPROPRIATE ANTICOAGULANT THERAPY. Performed at [facility], [address] [place], [place] [address]   Cytology - Non PAP;     Status: None   Collection Time: 07/07/19  4:00 PM  Result Value Ref Range   CYTOLOGY - NON GYN      CYTOLOGY - NON PAP CASE: [id number] PATIENT: [place] Non-Gynecological Cytology Report     Specimen Submitted: A. Pleural fluid, right  Clinical History: None provided    DIAGNOSIS: A. PLEURAL FLUID, RIGHT; ULTRASOUND-GUIDED THORACENTESIS: - POSITIVE FOR MALIGNANCY. - METASTATIC ADENOCARCINOMA, COMPATIBLE WITH PULMONARY PRIMARY, SIMILAR TO PREVIOUS ([id number]).  Slides reviewed: 1 ThinPrep, 1 cell block,  1 cytospin  GROSS DESCRIPTION: A. Labeled: Right pleural fluid Received: Fresh Volume: 1000 mL Description of fluid and container in which it is received: Received is red, cloudy fluid in a 1000 mL glass evacuated container. Cytospin slide(s) received: Yes, 1  Specimen material submitted for: Cell block and ThinPrep  The cell block material is fixed in formalin for 6 hours prior to processing.   Final Diagnosis performed by Allena Napoleon, MD.   Electronically signed 07/11/2019 2:37:33PM The electronic signature indicates that the named At tending Pathologist has evaluated the specimen Technical component performed at [facility], [address] [place], [place] [address] Lab: [phone] Dir: Rush Farmer, MD, MMM  Professional component performed at [facility], [facility], [place], [place],  [address] Lab: [phone] Dir:  Dellia Nims Rubinas, MD   Lactate dehydrogenase (pleural or peritoneal fluid)     Status: Abnormal   Collection Time: 07/07/19  4:00 PM  Result Value Ref Range   LD, Fluid 216 (H) 3 - 23 U/L    Comment: HEMOLYSIS AT THIS LEVEL MAY AFFECT RESULT (NOTE) Results should be evaluated in conjunction with serum values    Fluid Type-FLDH CYTO PERI     Comment: Performed at [facility], [address] [place], [place] [address]  Triglycerides, Body Fluid     Status: None   Collection Time: 07/07/19  4:00 PM  Result Value Ref Range   Triglycerides, Fluid 27 Not Estab. mg/dL    Comment: (NOTE) The reference intervals and other method performance specifications have not been established for this test. The test result should be integrated into the clinical context for interpretation. The reference interval(s) and other method performance specifications have not been established for this body fluid. The test result must be integrated into the clinical context for interpretation. Performed At: [facility] [address]  [place], [place] [id number] Rush Farmer MD [id number]    Fluid Type-FTRIG CYTO PERI     Comment: Performed at [facility], [address] [place], [place] [address]  Body fluid cell count with differential     Status: Abnormal   Collection Time: 07/07/19  4:00 PM  Result Value Ref Range   Fluid Type-FCT CYTO PERI    Color, Fluid RED (A) YELLOW   Appearance, Fluid TURBID (A) CLEAR   Total Nucleated Cell Count, Fluid 195 cu mm   Neutrophil Count, Fluid 7 %   Lymphs, Fluid 74 %   Monocyte-Macrophage-Serous Fluid 19 %   Eos, Fluid 0 %    Comment: Performed at [facility], [address], [place], [place] [address]  Body fluid culture     Status: None   Collection Time: 07/07/19  4:00 PM   Specimen: PATH Cytology Peritoneal fluid  Result Value Ref Range   Specimen Description      PERITONEAL Performed at [facility], [address], [place], [place] [address]    Special Requests      NONE Performed at [facility], [address] [place], [place] [address]    Gram Stain      RARE WBC PRESENT,BOTH PMN AND MONONUCLEAR NO ORGANISMS SEEN    Culture      NO GROWTH 3 DAYS Performed at [facility], [place] [address], [place], [place] [address]    Report Status 07/10/2019 FINAL   Albumin, pleural or peritoneal fluid     Status: None   Collection Time: 07/07/19  4:00 PM  Result Value Ref Range   Albumin, Fluid 2.6 g/dL    Comment: (NOTE) No normal range established for this test Results should be evaluated in conjunction with serum values    Fluid Type-FALB CYTO PERI     Comment: Performed at [facility], [address] [place], [place] [address]  Protein, pleural or peritoneal fluid     Status: None   Collection Time: 07/07/19  4:00 PM  Result Value Ref Range   Total protein, fluid 3.6 g/dL    Comment: (NOTE) No normal range established for this test Results should be evaluated in conjunction with serum  values    Fluid Type-FTP CYTO PERI     Comment: Performed at [facility], [address], [place], [place] [address]  Glucose, pleural  or peritoneal fluid     Status: None   Collection Time: 07/07/19  4:00 PM  Result Value Ref Range   Glucose, Fluid 111 mg/dL    Comment: (NOTE) No normal range established for this test Results should be evaluated in conjunction with serum values    Fluid Type-FGLU CYTO PERI     Comment: Performed at [facility], [address] [place], [place] [address]  [facility], Body Fluid     Status: None   Collection Time: 07/07/19  4:00 PM  Result Value Ref Range   pH, Body Fluid 7.4 Not Estab.    Comment: (NOTE) This test was developed and its performance characteristics determined by [facility]. It has not been cleared or approved by the Food and Drug Administration. The reference interval(s) and other method performance specifications have not been established for this body fluid. The test result must be integrated into the clinical context for interpretation. Performed At: [facility] [place], [place] [id number] Rush Farmer MD CV:[id number]    Source PERITONEAL     Comment: Performed at [facility], [address] [place], [place] [address]  Comprehensive metabolic panel     Status: Abnormal   Collection Time: 07/08/19  6:15 AM  Result Value Ref Range   Sodium 138 135 - 145 mmol/L   Potassium 3.9 3.5 - 5.1 mmol/L   Chloride 97 (L) 98 - 111 mmol/L   CO2 31 22 - 32 mmol/L   Glucose, Bld 106 (H) 70 - 99 mg/dL    Comment: Glucose reference range applies only to samples taken after fasting for at least 8 hours.   BUN 12 8 - 23 mg/dL   Creatinine, Ser 0.96 0.44 - 1.00 mg/dL   Calcium 9.0 8.9 - 10.3 mg/dL   Total Protein 5.8 (L) 6.5 - 8.1 g/dL   Albumin 3.2 (L) 3.5 - 5.0 g/dL   AST 17 15 - 41 U/L   ALT 5 0 - 44 U/L   Alkaline Phosphatase 49 38 - 126 U/L   Total Bilirubin 0.8 0.3 - 1.2 mg/dL   GFR  calc non Af Amer 51 (L) >60 mL/min   GFR calc Af Amer 59 (L) >60 mL/min   Anion gap 10 5 - 15    Comment: Performed at [facility], [address] [place], [place] [address]  Magnesium     Status: None   Collection Time: 07/08/19  6:15 AM  Result Value Ref Range   Magnesium 1.8 1.7 - 2.4 mg/dL    Comment: Performed at [facility], [address] [place], [place] [address]  Phosphorus     Status: None   Collection Time: 07/08/19  6:15 AM  Result Value Ref Range   Phosphorus 3.5 2.5 - 4.6 mg/dL    Comment: Performed at [facility], [address] [place], [place] [address]  CBC with Differential/Platelet     Status: Abnormal   Collection Time: 07/08/19  6:15 AM  Result Value Ref Range   WBC 5.7 4.0 - 10.5 K/uL   RBC 3.58 (L) 3.87 - 5.11 MIL/uL   Hemoglobin 10.6 (L) 12.0 - 15.0 g/dL   HCT 32.6 (L) 36 - 46 %   MCV 91.1 80.0 - 100.0 fL   MCH 29.6 26.0 - 34.0 pg   MCHC 32.5 30.0 - 36.0 g/dL   RDW 16.1 (H) 11.5 - 15.5 %   Platelets 168 150 - 400 K/uL   nRBC 0.0  0.0 - 0.2 %   Neutrophils Relative % 79 %   Neutro Abs 4.5 1.7 - 7.7 K/uL   Lymphocytes Relative 7 %   Lymphs Abs 0.4 (L) 0.7 - 4.0 K/uL   Monocytes Relative 10 %   Monocytes Absolute 0.6 0 - 1 K/uL   Eosinophils Relative 3 %   Eosinophils Absolute 0.2 0 - 0 K/uL   Basophils Relative 0 %   Basophils Absolute 0.0 0 - 0 K/uL   Immature Granulocytes 1 %   Abs Immature Granulocytes 0.05 0.00 - 0.07 K/uL    Comment: Performed at [facility], [address] [place], [place] [address]  Comprehensive metabolic panel     Status: Abnormal   Collection Time: 07/09/19  5:49 AM  Result Value Ref Range   Sodium 139 135 - 145 mmol/L   Potassium 3.7 3.5 - 5.1 mmol/L   Chloride 97 (L) 98 - 111 mmol/L   CO2 31 22 - 32 mmol/L   Glucose, Bld 100 (H) 70 - 99 mg/dL    Comment: Glucose reference range applies only to samples taken after fasting for at least 8 hours.   BUN 12 8 - 23 mg/dL    Creatinine, Ser 0.75 0.44 - 1.00 mg/dL   Calcium 8.9 8.9 - 10.3 mg/dL   Total Protein 5.6 (L) 6.5 - 8.1 g/dL   Albumin 3.0 (L) 3.5 - 5.0 g/dL   AST 14 (L) 15 - 41 U/L   ALT <5 0 - 44 U/L   Alkaline Phosphatase 45 38 - 126 U/L   Total Bilirubin 0.8 0.3 - 1.2 mg/dL   GFR calc non Af Amer >60 >60 mL/min   GFR calc Af Amer >60 >60 mL/min   Anion gap 11 5 - 15    Comment: Performed at [facility], [address] [place], [place] [address]  Magnesium     Status: None   Collection Time: 07/09/19  5:49 AM  Result Value Ref Range   Magnesium 1.7 1.7 - 2.4 mg/dL    Comment: Performed at [facility], [address] [place], [place] [address]  Phosphorus     Status: None   Collection Time: 07/09/19  5:49 AM  Result Value Ref Range   Phosphorus 3.8 2.5 - 4.6 mg/dL    Comment: Performed at [facility], [address] [place], [place] [address]  CBC with Differential/Platelet     Status: Abnormal   Collection Time: 07/09/19  5:49 AM  Result Value Ref Range   WBC 5.2 4.0 - 10.5 K/uL   RBC 3.47 (L) 3.87 - 5.11 MIL/uL   Hemoglobin 10.0 (L) 12.0 - 15.0 g/dL   HCT 31.3 (L) 36 - 46 %   MCV 90.2 80.0 - 100.0 fL   MCH 28.8 26.0 - 34.0 pg   MCHC 31.9 30.0 - 36.0 g/dL   RDW 15.9 (H) 11.5 - 15.5 %   Platelets 176 150 - 400 K/uL   nRBC 0.0 0.0 - 0.2 %   Neutrophils Relative % 73 %   Neutro Abs 3.8 1.7 - 7.7 K/uL   Lymphocytes Relative 10 %   Lymphs Abs 0.5 (L) 0.7 - 4.0 K/uL   Monocytes Relative 12 %   Monocytes Absolute 0.6 0 - 1 K/uL   Eosinophils Relative 4 %   Eosinophils Absolute 0.2 0 - 0 K/uL   Basophils Relative 0 %   Basophils Absolute 0.0 0 - 0 K/uL   Immature Granulocytes 1 %  Abs Immature Granulocytes 0.04 0.00 - 0.07 K/uL    Comment: Performed at [facility], [address] [place], [place] [address]  SARS Coronavirus 2 by RT PCR (hospital order, performed in [facility] hospital lab) Nasopharyngeal Nasopharyngeal Swab     Status:  None   Collection Time: 07/10/19 11:42 AM   Specimen: Nasopharyngeal Swab  Result Value Ref Range   SARS Coronavirus 2 NEGATIVE NEGATIVE    Comment: (NOTE) SARS-CoV-2 target nucleic acids are NOT DETECTED. The SARS-CoV-2 RNA is generally detectable in upper and lower respiratory specimens during the acute phase of infection. The lowest concentration of SARS-CoV-2 viral copies this assay can detect is 250 copies / mL. A negative result does not preclude SARS-CoV-2 infection and should not be used as the sole basis for treatment or other patient management decisions.  A negative result may occur with improper specimen collection / handling, submission of specimen other than nasopharyngeal swab, presence of viral mutation(s) within the areas targeted by this assay, and inadequate number of viral copies (<250 copies / mL). A negative result must be combined with clinical observations, patient history, and epidemiological information. Fact Sheet for Patients:   StrictlyIdeas.no Fact Sheet for Healthcare Providers: BankingDealers.co.za This test is not yet approved or cleared  by the [place] FDA and has been authorized for detection and/or diagnosis of SARS-CoV-2 by FDA under an Emergency Use Authorization (EUA).  This EUA will remain in effect (meaning this test can be used) for the duration of the COVID-19 declaration under Section 564(b)(1) of the Act, 21 U.S.C. section 360bbb-3(b)(1), unless the authorization is terminated or revoked sooner. Performed at [facility], [address] [place], [place] [address]   Basic metabolic panel     Status: Abnormal   Collection Time: 07/19/19  9:20 AM  Result Value Ref Range   Sodium 136 135 - 145 mmol/L   Potassium 3.9 3.5 - 5.1 mmol/L   Chloride 98 98 - 111 mmol/L   CO2 27 22 - 32 mmol/L   Glucose, Bld 121 (H) 70 - 99 mg/dL    Comment: Glucose reference range applies only to  samples taken after fasting for at least 8 hours.   BUN 14 8 - 23 mg/dL   Creatinine, Ser 0.98 0.44 - 1.00 mg/dL   Calcium 8.9 8.9 - 10.3 mg/dL   GFR calc non Af Amer 50 (L) >60 mL/min   GFR calc Af Amer 58 (L) >60 mL/min   Anion gap 11 5 - 15    Comment: Performed at [facility], [place], [place] [address]  CBC with Differential     Status: Abnormal   Collection Time: 07/19/19  9:20 AM  Result Value Ref Range   WBC 5.8 4.0 - 10.5 K/uL   RBC 3.72 (L) 3.87 - 5.11 MIL/uL   Hemoglobin 10.8 (L) 12.0 - 15.0 g/dL   HCT 33.9 (L) 36 - 46 %   MCV 91.1 80.0 - 100.0 fL   MCH 29.0 26.0 - 34.0 pg   MCHC 31.9 30.0 - 36.0 g/dL   RDW 15.5 11.5 - 15.5 %   Platelets 247 150 - 400 K/uL   nRBC 0.0 0.0 - 0.2 %   Neutrophils Relative % 77 %   Neutro Abs 4.5 1.7 - 7.7 K/uL   Lymphocytes Relative 9 %   Lymphs Abs 0.5 (L) 0.7 - 4.0 K/uL   Monocytes Relative 10 %   Monocytes Absolute 0.6 0 - 1  K/uL   Eosinophils Relative 3 %   Eosinophils Absolute 0.2 0 - 0 K/uL   Basophils Relative 0 %   Basophils Absolute 0.0 0 - 0 K/uL   Immature Granulocytes 1 %   Abs Immature Granulocytes 0.05 0.00 - 0.07 K/uL    Comment: Performed at [facility], [address] [place], [place] [address]  Thyroid Panel With TSH     Status: Abnormal   Collection Time: 07/19/19  9:25 AM  Result Value Ref Range   TSH 6.190 (H) 0.450 - 4.500 uIU/mL   T4, Total 8.4 4.5 - 12.0 ug/dL   T3 Uptake Ratio 26 24 - 39 %   Free Thyroxine Index 2.2 1.2 - 4.9    Comment: (NOTE) Performed At: [facility] [place], [place] [id number] Rush Farmer MD [id number]   SARS CORONAVIRUS 2 (TAT 6-24 HRS) Nasopharyngeal Nasopharyngeal Swab     Status: None   Collection Time: 07/21/19  9:37 AM   Specimen: Nasopharyngeal Swab  Result Value Ref Range   SARS Coronavirus 2 NEGATIVE NEGATIVE    Comment: (NOTE) SARS-CoV-2 target nucleic acids are NOT DETECTED.  The SARS-CoV-2 RNA is  generally detectable in upper and lower respiratory specimens during the acute phase of infection. Negative results do not preclude SARS-CoV-2 infection, do not rule out co-infections with other pathogens, and should not be used as the sole basis for treatment or other patient management decisions. Negative results must be combined with clinical observations, patient history, and epidemiological information. The expected result is Negative.  Fact Sheet for Patients: SugarRoll.be  Fact Sheet for Healthcare Providers: https://www.woods-mathews.com/  This test is not yet approved or cleared by the [place] FDA and  has been authorized for detection and/or diagnosis of SARS-CoV-2 by FDA under an Emergency Use Authorization (EUA). This EUA will remain  in effect (meaning this test can be used) for the duration of the COVID-19 declaration under Se ction 564(b)(1) of the Act, 21 U.S.C. section 360bbb-3(b)(1), unless the authorization is terminated or revoked sooner.  Performed at [facility], [place] [address], [place], [place] [address]   SARS CORONAVIRUS 2 (TAT 6-24 HRS) Nasopharyngeal Nasopharyngeal Swab     Status: None   Collection Time: 08/03/19 11:25 AM   Specimen: Nasopharyngeal Swab  Result Value Ref Range   SARS Coronavirus 2 NEGATIVE NEGATIVE    Comment: (NOTE) SARS-CoV-2 target nucleic acids are NOT DETECTED.  The SARS-CoV-2 RNA is generally detectable in upper and lower respiratory specimens during the acute phase of infection. Negative results do not preclude SARS-CoV-2 infection, do not rule out co-infections with other pathogens, and should not be used as the sole basis for treatment or other patient management decisions. Negative results must be combined with clinical observations, patient history, and epidemiological information. The expected result is Negative.  Fact Sheet for  Patients: SugarRoll.be  Fact Sheet for Healthcare Providers: https://www.woods-mathews.com/  This test is not yet approved or cleared by the [place] FDA and  has been authorized for detection and/or diagnosis of SARS-CoV-2 by FDA under an Emergency Use Authorization (EUA). This EUA will remain  in effect (meaning this test can be used) for the duration of the COVID-19 declaration under Se ction 564(b)(1) of the Act, 21 U.S.C. section 360bbb-3(b)(1), unless the authorization is terminated or revoked sooner.  Performed at [facility], [place] [address] [place], [place] [address]     Radiology: [facility] 1 View  Result Date:  08/04/2019 CLINICAL DATA:  Post thoracentesis. EXAM: PORTABLE CHEST 1 VIEW COMPARISON:  07/22/2019. FINDINGS: Mediastinum hilar structures normal. Cardiomegaly. Stable mild bilateral interstitial prominence. Tiny right pleural effusion. No prominent effusion noted. No pneumothorax post thoracentesis. IMPRESSION: No pneumothorax post thoracentesis. Electronically Signed   By: Marcello Moores  Register   On: 08/04/2019 14:06   US THORACENTESIS ASP PLEURAL SPACE W/IMG GUIDE  Result Date: 08/04/2019 INDICATION: Patient with history of adenocarcinoma of the lung, left breast cancer status post mastectomy with recurrent right malignant pleural effusion. Request to IR for therapeutic thoracentesis. EXAM: ULTRASOUND GUIDED RIGHT THORACENTESIS MEDICATIONS: 10 mL 1% lidocaine COMPLICATIONS: None immediate. PROCEDURE: An ultrasound guided thoracentesis was thoroughly discussed with the patient and questions answered. The benefits, risks, alternatives and complications were also discussed. The patient understands and wishes to proceed with the procedure. Written consent was obtained. Ultrasound was performed to localize and mark an adequate pocket of fluid in the right chest. The area was then prepped and draped in the normal sterile  fashion. 1% Lidocaine was used for local anesthesia. Under ultrasound guidance a 6 Fr Safe-T-Centesis catheter was introduced. Thoracentesis was performed. The catheter was removed and a dressing applied. FINDINGS: A total of approximately 1.0 L of serosanguineous fluid was removed. IMPRESSION: Successful ultrasound guided right thoracentesis yielding 1.0 L of pleural fluid. Read by Candiss Norse, PA-C Electronically Signed   By: Marcello Moores  Register   On: 08/04/2019 13:37    No results found.  No results found.    Assessment and Plan: Patient Active Problem List   Diagnosis Date Noted   Palliative care encounter    DVT (deep venous thrombosis) ([place]) 07/04/2019   Goals of care, counseling/discussion 06/13/2019   Hematoma 06/10/2019   Primary malignant neoplasm of right lower lobe of lung ([place]) 04/01/2019   Neoplasm of uncertain behavior of right lower lobe of lung 03/16/2019   Acute upper respiratory infection 12/08/2018   Vasomotor rhinitis 12/08/2018   Cough 09/16/2018   Dependence on nocturnal oxygen therapy 07/23/2018   SOB (shortness of breath) 07/23/2018   Lower extremity pain, bilateral 02/24/2018   Encounter for general adult medical examination with abnormal findings 02/22/2018   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 Decreased breath sounds at right lung base Pts daughter will call oncology to schedule thoracentesis.  Covid test ordered as per  their requirement.   - Novel Coronavirus, NAA ([facility])  2. Chronic pain due to malignant neoplastic disease Continue to use morphine as needed.  Currently using  3. Malignant neoplasm of right lung, unspecified part of lung ([place]) Continue to manage pain as discussed.  Follow up with oncology for thoracentesis.   4. Sacral decubitus ulcer, stage III ([place]) Continue to use duoderm and other interventions as before.    General Counseling: I have discussed the findings of the evaluation and examination with Belina.  I have also discussed any further diagnostic evaluation thatmay be needed or ordered today. Dalis verbalizes understanding of the findings of todays visit. We also reviewed her medications today and discussed drug interactions and side effects  including but not limited excessive drowsiness and altered mental states. We also discussed that there is always a risk not just to her but also people around her. she has been encouraged to call the office with any questions or concerns that should arise related to todays visit.  No orders of the defined types were placed in this encounter.    Time spent: 30 This patient was seen by Orson Gear AGNP-C in Collaboration with Dr. Devona Konig as a part of collaborative care agreement.   I have personally obtained a history, examined the patient, evaluated laboratory and imaging results, formulated the assessment and plan and placed orders.    Allyne Gee, MD [facility] Pulmonary and Critical Care Sleep medicine

## 2019-09-05 NOTE — Telephone Encounter (Signed)
Daughter came to cancer center to request for stat u/s thora to be completed asap. Stated that pulmonary already ordered the rapid covid testing today in prep for the [place] thora, but the pulmonologist would like Dr. B to order the u/s thora asap. Daughter would like thora to be performed in the am tom.  Spoke with Dr. Rogue Bussing who agreed to order the thora. Scheduling contacted by RN   I will notify her of the apt time.

## 2019-09-05 NOTE — Telephone Encounter (Signed)
Spoke with daughter. Daughter aware that patient's ultrasound thoracentesis was set up for 8:30 am tomorrow. Daughter states that pt already had covid testing this morning - ordered by pulmonology.  Called and left vm for preadmit testing to ensure that patient's results will be available in am prior to procedure.
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
## 2019-09-06 ENCOUNTER — Other Ambulatory Visit: Payer: Self-pay

## 2019-09-06 ENCOUNTER — Ambulatory Visit
Admission: RE | Admit: 2019-09-06 | Discharge: 2019-09-06 | Disposition: A | Discharge status: Home / Self Care | Payer: Medicare Other | Source: Ambulatory Visit | Attending: Radiology | Admitting: Radiology

## 2019-09-06 ENCOUNTER — Ambulatory Visit
Admission: RE | Admit: 2019-09-06 | Discharge: 2019-09-06 | Disposition: A | Discharge status: Home / Self Care | Payer: Medicare Other | Source: Ambulatory Visit | Attending: Internal Medicine | Admitting: Internal Medicine

## 2019-09-06 ENCOUNTER — Telehealth: Payer: Self-pay | Admitting: *Deleted

## 2019-09-06 ENCOUNTER — Inpatient Hospital Stay: Payer: Medicare Other | Admitting: Internal Medicine

## 2019-09-06 ENCOUNTER — Ambulatory Visit: Payer: Medicare Other | Admitting: Adult Health

## 2019-09-06 DIAGNOSIS — J90 Pleural effusion, not elsewhere classified: Secondary | ICD-10-CM | POA: Diagnosis not present

## 2019-09-06 DIAGNOSIS — J9811 Atelectasis: Secondary | ICD-10-CM | POA: Diagnosis not present

## 2019-09-06 DIAGNOSIS — C3431 Malignant neoplasm of lower lobe, right bronchus or lung: Secondary | ICD-10-CM

## 2019-09-06 DIAGNOSIS — C3490 Malignant neoplasm of unspecified part of unspecified bronchus or lung: Secondary | ICD-10-CM | POA: Diagnosis not present

## 2019-09-06 DIAGNOSIS — J910 Malignant pleural effusion: Secondary | ICD-10-CM | POA: Diagnosis not present

## 2019-09-06 DIAGNOSIS — C50919 Malignant neoplasm of unspecified site of unspecified female breast: Secondary | ICD-10-CM | POA: Diagnosis not present

## 2019-09-06 LAB — SARS CORONAVIRUS 2 (TAT 6-24 HRS): SARS Coronavirus 2: NEGATIVE

## 2019-09-06 NOTE — Procedures (Signed)
PROCEDURE SUMMARY:  Successful US guided right thoracentesis. Yielded 1350 mL of serosanguinous fluid. Pt tolerated procedure well. No immediate complications.  Specimen was not sent for labs. CXR ordered.  EBL < 5 mL  Ascencion Dike PA-C 09/06/2019 9:22 AM

## 2019-09-06 NOTE — Telephone Encounter (Signed)
Spoke with daughter. Family would like Dr. B to order hospice services. Would like to cnl today's apt with Dr. Rogue Bussing. Daughter expressed gratitude for the Cancer [facility]'s care for her mother. apts cnl today per daughter's request.

## 2019-09-07 ENCOUNTER — Other Ambulatory Visit: Payer: Self-pay | Admitting: Internal Medicine

## 2019-09-07 ENCOUNTER — Telehealth: Payer: Self-pay | Admitting: *Deleted

## 2019-09-07 DIAGNOSIS — J90 Pleural effusion, not elsewhere classified: Secondary | ICD-10-CM

## 2019-09-07 DIAGNOSIS — C3431 Malignant neoplasm of lower lobe, right bronchus or lung: Secondary | ICD-10-CM

## 2019-09-07 DIAGNOSIS — I10 Essential (primary) hypertension: Secondary | ICD-10-CM

## 2019-09-07 NOTE — Telephone Encounter (Signed)
Can we consider a pleurx?

## 2019-09-07 NOTE — Telephone Encounter (Signed)
Heather- I agree with pleurex; as this will be on going issue. Josh any thoughts? Thanks GB

## 2019-09-07 NOTE — Telephone Encounter (Signed)
Gail Nelson called asking for patient to be scheduled for Thoracentesis on 8/17 and 8/31 early in the morning and call her with appointments

## 2019-09-08 NOTE — Telephone Encounter (Signed)
I think that would be reasonable. I have discussed it with patient/daughter before and daughter was adamant at the time for "no tubes" but that might have changed. The other limiting factor is that hospice does not like to cover the cost once they are enrolled in hospice care. I will call the daughter and see if she is interested.

## 2019-09-08 NOTE — Telephone Encounter (Signed)
Per Josh, hold off on the pleurx cath at this time (pt/family preference). Proceed with standing orders every other week for thoracentesis. Orders placed. Will contact scheduling to arrange thora and preadmit covid testing.

## 2019-09-08 NOTE — Telephone Encounter (Signed)
I spoke with patient's daughter. She says she has talked again with patient about the Pleurx and that they do not want it. They would like to go ahead and schedule the thoracentesis twice in August and then can cancel if patient declines or passes. Daughter seemed to recognize that patient is quickly approaching end of life.

## 2019-09-09 ENCOUNTER — Telehealth: Payer: Self-pay

## 2019-09-09 ENCOUNTER — Telehealth: Payer: Self-pay | Admitting: *Deleted

## 2019-09-09 NOTE — Telephone Encounter (Signed)
Scheduling worksheet faxed to sch. These apts.

## 2019-09-09 NOTE — Telephone Encounter (Signed)
Confirmed and screened for office visit 8/10

## 2019-09-12 ENCOUNTER — Encounter: Payer: Self-pay | Admitting: *Deleted

## 2019-09-12 NOTE — Telephone Encounter (Signed)
Thoracentesis scheduled. Mychart msg sent to pt's daughter

## 2019-09-13 ENCOUNTER — Other Ambulatory Visit: Payer: Self-pay

## 2019-09-13 ENCOUNTER — Encounter: Payer: Self-pay | Admitting: Adult Health

## 2019-09-13 ENCOUNTER — Encounter: Payer: Self-pay | Admitting: *Deleted

## 2019-09-13 ENCOUNTER — Ambulatory Visit (INDEPENDENT_AMBULATORY_CARE_PROVIDER_SITE_OTHER): Payer: Medicare Other | Admitting: Adult Health

## 2019-09-13 VITALS — BP 114/56 | HR 70 | Temp 98.1°F | Resp 16 | Ht 62.0 in | Wt 180.6 lb

## 2019-09-13 DIAGNOSIS — C3491 Malignant neoplasm of unspecified part of right bronchus or lung: Secondary | ICD-10-CM

## 2019-09-13 DIAGNOSIS — Z01812 Encounter for preprocedural laboratory examination: Secondary | ICD-10-CM | POA: Diagnosis not present

## 2019-09-13 DIAGNOSIS — Z20822 Contact with and (suspected) exposure to covid-19: Secondary | ICD-10-CM

## 2019-09-13 DIAGNOSIS — G893 Neoplasm related pain (acute) (chronic): Secondary | ICD-10-CM

## 2019-09-13 NOTE — Progress Notes (Signed)
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 Yes, 1  Specimen material submitted for: Cell block and ThinPrep  The cell block material is fixed in  formalin for 6 hours prior to processing.   Final Diagnosis performed by Allena Napoleon, MD.   Electronically signed 07/11/2019 2:37:33PM The electronic signature indicates that the named At tending Pathologist has evaluated the specimen Technical component performed at [facility], [address] [place], [place] [address] Lab: [phone] Dir: Rush Farmer, MD, MMM  Professional component performed at [facility], [facility], [place], [place], [place] [address] Lab: [phone] Dir: Dellia Nims. Rubinas, MD   Lactate dehydrogenase (pleural or peritoneal fluid)     Status: Abnormal   Collection Time: 07/07/19  4:00 PM  Result Value Ref Range   LD, Fluid 216 (H) 3 - 23 U/L    Comment: HEMOLYSIS AT THIS LEVEL MAY AFFECT RESULT (NOTE) Results should be evaluated in conjunction with serum values    Fluid Type-FLDH CYTO PERI     Comment: Performed at [facility], [address] [place], [place] [address]  Triglycerides, Body Fluid     Status: None   Collection Time: 07/07/19  4:00 PM  Result Value Ref Range   Triglycerides, Fluid 27 Not Estab. mg/dL    Comment: (NOTE) The reference intervals and other method performance specifications have not been established for this test. The test result should be integrated into the clinical context for interpretation. The reference interval(s) and other method performance specifications have not been established for this body fluid. The test result must be integrated into the clinical context for interpretation. Performed At: [facility] [address] [place], [place] [id number] Rush Farmer MD [id number]    Fluid Type-FTRIG CYTO PERI     Comment: Performed at [facility], [address] [place], [place] [address]  Body fluid cell count with differential     Status: Abnormal   Collection Time: 07/07/19  4:00 PM  Result Value Ref Range   Fluid Type-FCT CYTO PERI    Color, Fluid RED (A)  YELLOW   Appearance, Fluid TURBID (A)  CLEAR   Total Nucleated Cell Count, Fluid 195 cu mm   Neutrophil Count, Fluid 7 %   Lymphs, Fluid 74 %   Monocyte-Macrophage-Serous Fluid 19 %   Eos, Fluid 0 %    Comment: Performed at [facility], [address] [place], [place] [address]  Body fluid culture     Status: None   Collection Time: 07/07/19  4:00 PM   Specimen: PATH Cytology Peritoneal fluid  Result Value Ref Range   Specimen Description      PERITONEAL Performed at [facility], [address] [place] [address] [place], [place] [address]    Special Requests      NONE Performed at [facility], [place], [place] [address]    Gram Stain      RARE WBC PRESENT,BOTH PMN AND MONONUCLEAR NO ORGANISMS SEEN    Culture      NO GROWTH 3 DAYS Performed at [facility], [place] [address] [place], [place] [address]    Report Status 07/10/2019 FINAL   Albumin, pleural or peritoneal fluid     Status: None   Collection Time: 07/07/19  4:00 PM  Result Value Ref Range   Albumin, Fluid 2.6 g/dL    Comment: (NOTE) No normal range established for this test Results should be evaluated in conjunction with serum values    Fluid Type-FALB CYTO PERI     Comment: Performed at [facility], [address] [place], [place] [address]  Protein, pleural or peritoneal fluid     Status: None   Collection Time: 07/07/19  4:00 PM  Result Value Ref Range   Total protein, fluid 3.6 g/dL    Comment: (NOTE) No normal range established for this test Results should be evaluated in conjunction with serum values    Fluid Type-FTP CYTO PERI     Comment: Performed at [facility], [address] [place], [place] [address]  Glucose, pleural or peritoneal fluid     Status: None   Collection Time: 07/07/19  4:00 PM  Result Value Ref Range   Glucose, Fluid 111 mg/dL    Comment: (NOTE) No normal range established for this test Results should be evaluated  in conjunction with serum values    Fluid Type-FGLU CYTO PERI     Comment: Performed at [facility], [address] [place], [place] [address]  [facility], Body Fluid     Status: None   Collection Time: 07/07/19  4:00 PM  Result Value Ref Range   pH, Body Fluid 7.4 Not Estab.    Comment: (NOTE) This test was developed and its performance characteristics determined by [facility]. It has not been cleared or approved by the Food and Drug Administration. The reference interval(s) and other method performance specifications have not been established for this body fluid. The test result must be integrated into the clinical context for interpretation. Performed At: [facility] [place], [place] [id number] Rush Farmer MD HC:[id number]    Source PERITONEAL     Comment: Performed at [facility], [address] [place],  [address]  Comprehensive metabolic panel     Status: Abnormal   Collection Time: 07/08/19  6:15 AM  Result Value Ref Range   Sodium 138 135 - 145 mmol/L   Potassium 3.9 3.5 - 5.1 mmol/L   Chloride 97 (L) 98 - 111 mmol/L   CO2 31 22 - 32 mmol/L   Glucose, Bld 106 (H) 70 - 99  mg/dL    Comment: Glucose reference range applies only to samples taken after fasting for at least 8 hours.   BUN 12 8 - 23 mg/dL   Creatinine, Ser 0.96 0.44 - 1.00 mg/dL   Calcium 9.0 8.9 - 10.3 mg/dL   Total Protein 5.8 (L) 6.5 - 8.1 g/dL   Albumin 3.2 (L) 3.5 - 5.0 g/dL   AST 17 15 - 41 U/L   ALT 5 0 - 44 U/L   Alkaline Phosphatase 49 38 - 126 U/L   Total Bilirubin 0.8 0.3 - 1.2 mg/dL   GFR calc non Af Amer 51 (L) >60 mL/min   GFR calc Af Amer 59 (L) >60 mL/min   Anion gap 10 5 - 15    Comment: Performed at [facility], [address] [place], [place] [address]  Magnesium     Status: None   Collection Time: 07/08/19  6:15 AM  Result Value Ref Range   Magnesium 1.8 1.7 - 2.4 mg/dL    Comment: Performed at [facility], [address] [place], [place] [address]  Phosphorus     Status: None   Collection Time: 07/08/19  6:15 AM  Result Value Ref Range   Phosphorus 3.5 2.5 - 4.6 mg/dL    Comment: Performed at [facility], [address] [place], [place] [address]  CBC with Differential/Platelet     Status: Abnormal   Collection Time: 07/08/19  6:15 AM  Result Value Ref Range   WBC 5.7 4.0 - 10.5 K/uL   RBC 3.58 (L) 3.87 - 5.11 MIL/uL   Hemoglobin 10.6 (L) 12.0 - 15.0 g/dL   HCT 32.6 (L) 36 - 46 %   MCV 91.1 80.0 - 100.0 fL   MCH 29.6 26.0 - 34.0 pg   MCHC 32.5 30.0 - 36.0 g/dL   RDW 16.1 (H) 11.5 - 15.5 %   Platelets 168 150 - 400 K/uL   nRBC 0.0 0.0 - 0.2 %   Neutrophils Relative % 79 %   Neutro Abs 4.5 1.7 - 7.7 K/uL   Lymphocytes Relative 7 %   Lymphs Abs 0.4 (L) 0.7 - 4.0 K/uL   Monocytes Relative 10 %   Monocytes Absolute 0.6 0 - 1 K/uL   Eosinophils Relative 3 %   Eosinophils Absolute 0.2 0 - 0 K/uL   Basophils Relative 0 %   Basophils Absolute 0.0 0 - 0 K/uL   Immature Granulocytes 1 %   Abs Immature Granulocytes 0.05 0.00 - 0.07 K/uL    Comment: Performed at [facility], [address] [place],  [address]  Comprehensive metabolic panel     Status: Abnormal   Collection Time: 07/09/19  5:49 AM  Result Value Ref Range   Sodium 139 135 - 145 mmol/L   Potassium 3.7 3.5 - 5.1 mmol/L   Chloride 97 (L) 98 - 111 mmol/L   CO2 31 22 - 32 mmol/L   Glucose, Bld 100 (H) 70 - 99 mg/dL    Comment: Glucose reference range applies only to samples taken after fasting for at least 8 hours.   BUN 12 8 - 23 mg/dL   Creatinine, Ser 0.75 0.44 - 1.00 mg/dL   Calcium 8.9 8.9 - 10.3 mg/dL   Total Protein 5.6 (L) 6.5 - 8.1 g/dL   Albumin 3.0 (L) 3.5 - 5.0 g/dL   AST 14 (L) 15 - 41 U/L   ALT <5 0 - 44 U/L   Alkaline Phosphatase  45 38 - 126 U/L   Total Bilirubin 0.8 0.3 - 1.2 mg/dL   GFR calc non Af Amer >60 >60 mL/min   GFR calc Af Amer >60 >60 mL/min   Anion gap 11 5 - 15     Comment: Performed at [facility], [address] [place], [place] [address]  Magnesium     Status: None   Collection Time: 07/09/19  5:49 AM  Result Value Ref Range   Magnesium 1.7 1.7 - 2.4 mg/dL    Comment: Performed at [facility], [address] [place], [place] [address]  Phosphorus     Status: None   Collection Time: 07/09/19  5:49 AM  Result Value Ref Range   Phosphorus 3.8 2.5 - 4.6 mg/dL    Comment: Performed at [facility], [address] [place], [place] [address]  CBC with Differential/Platelet     Status: Abnormal   Collection Time: 07/09/19  5:49 AM  Result Value Ref Range   WBC 5.2 4.0 - 10.5 K/uL   RBC 3.47 (L) 3.87 - 5.11 MIL/uL   Hemoglobin 10.0 (L) 12.0 - 15.0 g/dL   HCT 31.3 (L) 36 - 46 %   MCV 90.2 80.0 - 100.0 fL   MCH 28.8 26.0 - 34.0 pg   MCHC 31.9 30.0 - 36.0 g/dL   RDW 15.9 (H) 11.5 - 15.5 %   Platelets 176 150 - 400 K/uL   nRBC 0.0 0.0 - 0.2 %   Neutrophils Relative % 73 %   Neutro Abs 3.8 1.7 - 7.7 K/uL   Lymphocytes Relative 10 %   Lymphs Abs 0.5 (L) 0.7 - 4.0 K/uL   Monocytes Relative 12 %   Monocytes Absolute 0.6 0 - 1 K/uL   Eosinophils Relative 4 %   Eosinophils Absolute 0.2 0 - 0 K/uL   Basophils Relative 0 %   Basophils Absolute 0.0 0 - 0 K/uL   Immature Granulocytes 1 %   Abs Immature Granulocytes 0.04 0.00 - 0.07 K/uL    Comment: Performed at [facility], [address] [place], [place] [address]  SARS Coronavirus 2 by RT PCR (hospital order, performed in [facility] hospital lab) Nasopharyngeal Nasopharyngeal Swab     Status: None   Collection Time: 07/10/19 11:42 AM   Specimen: Nasopharyngeal Swab  Result Value Ref Range   SARS Coronavirus 2 NEGATIVE NEGATIVE    Comment: (NOTE) SARS-CoV-2 target nucleic acids are NOT DETECTED. The SARS-CoV-2 RNA is generally detectable in upper and lower respiratory specimens during the acute phase of infection. The lowest concentration of SARS-CoV-2  viral copies this assay can detect is 250 copies / mL. A negative result does not preclude SARS-CoV-2 infection and should not be used as the sole basis for treatment or other patient management decisions.  A negative result may occur with improper specimen collection / handling, submission of specimen other than nasopharyngeal swab, presence of viral mutation(s) within the areas targeted by this assay, and inadequate number of viral copies (<250 copies / mL). A negative result must be combined with clinical observations, patient history, and epidemiological information. Fact Sheet for Patients:   StrictlyIdeas.no Fact Sheet for Healthcare Providers: BankingDealers.co.za This test is not yet approved or cleared  by the [place] FDA and has been authorized for detection and/or diagnosis of SARS-CoV-2 by FDA under an Emergency Use Authorization (EUA).  This EUA will remain in effect (meaning this test can be used) for the duration of the  COVID-19 declaration under Section 564(b)(1) of the Act, 21 U.S.C. section 360bbb-3(b)(1), unless the authorization is terminated or revoked sooner. Performed at [facility], [address] [place], [place] [address]   Basic metabolic panel     Status: Abnormal   Collection Time: 07/19/19  9:20 AM  Result Value Ref Range   Sodium 136 135 - 145 mmol/L   Potassium 3.9 3.5 - 5.1 mmol/L   Chloride 98 98 - 111 mmol/L   CO2 27 22 - 32 mmol/L   Glucose, Bld 121 (H) 70 - 99 mg/dL    Comment: Glucose reference range applies only to samples taken after fasting for at least 8 hours.   BUN 14 8 - 23 mg/dL   Creatinine, Ser 0.98 0.44 - 1.00 mg/dL   Calcium 8.9 8.9 - 10.3 mg/dL   GFR calc non Af Amer 50 (L) >60 mL/min   GFR calc Af Amer 58 (L) >60 mL/min   Anion gap 11 5 - 15    Comment: Performed at [facility], [address] [place], [place] [address]  CBC with Differential     Status:  Abnormal   Collection Time: 07/19/19  9:20 AM  Result Value Ref Range   WBC 5.8 4.0 - 10.5 K/uL   RBC 3.72 (L) 3.87 - 5.11 MIL/uL   Hemoglobin 10.8 (L) 12.0 - 15.0 g/dL   HCT 33.9 (L) 36 - 46 %   MCV 91.1 80.0 - 100.0 fL   MCH 29.0 26.0 - 34.0 pg   MCHC 31.9 30.0 - 36.0 g/dL   RDW 15.5 11.5 - 15.5 %   Platelets 247 150 - 400 K/uL   nRBC 0.0 0.0 - 0.2 %   Neutrophils Relative % 77 %   Neutro Abs 4.5 1.7 - 7.7 K/uL   Lymphocytes Relative 9 %   Lymphs Abs 0.5 (L) 0.7 - 4.0 K/uL   Monocytes Relative 10 %   Monocytes Absolute 0.6 0 - 1 K/uL   Eosinophils Relative 3 %   Eosinophils Absolute 0.2 0 - 0 K/uL   Basophils Relative 0 %   Basophils Absolute 0.0 0 - 0 K/uL   Immature Granulocytes 1 %   Abs Immature Granulocytes 0.05 0.00 - 0.07 K/uL    Comment: Performed at [facility], [address] [place], [place] [address]  Thyroid Panel With TSH     Status: Abnormal   Collection Time: 07/19/19  9:25 AM  Result Value Ref Range   TSH 6.190 (H) 0.450 - 4.500 uIU/mL   T4, Total 8.4 4.5 - 12.0 ug/dL   T3 Uptake Ratio 26 24 - 39 %   Free Thyroxine Index 2.2 1.2 - 4.9    Comment: (NOTE) Performed At: [facility] [place], [place] [id number] Rush Farmer MD [id number]   SARS CORONAVIRUS 2 (TAT 6-24 HRS) Nasopharyngeal Nasopharyngeal Swab     Status: None   Collection Time: 07/21/19  9:37 AM   Specimen: Nasopharyngeal Swab  Result Value Ref Range   SARS Coronavirus 2 NEGATIVE NEGATIVE    Comment: (NOTE) SARS-CoV-2 target nucleic acids are NOT DETECTED.  The SARS-CoV-2 RNA is generally detectable in upper and lower respiratory specimens during the acute phase of infection. Negative results do not preclude SARS-CoV-2 infection, do not rule out co-infections with other pathogens, and should not be used as the sole basis for treatment or other patient management decisions. Negative results must be combined with clinical observations, patient  history, and  epidemiological information. The expected result is Negative.  Fact Sheet for Patients: SugarRoll.be  Fact Sheet for Healthcare Providers: https://www.woods-mathews.com/  This test is not yet approved or cleared by the [place] FDA and  has been authorized for detection and/or diagnosis of SARS-CoV-2 by FDA under an Emergency Use Authorization (EUA). This EUA will remain  in effect (meaning this test can be used) for the duration of the COVID-19 declaration under Se ction 564(b)(1) of the Act, 21 U.S.C. section 360bbb-3(b)(1), unless the authorization is terminated or revoked sooner.  Performed at [facility], [place] [address] [place], [place] [address]   SARS CORONAVIRUS 2 (TAT 6-24 HRS) Nasopharyngeal Nasopharyngeal Swab     Status: None   Collection Time: 08/03/19 11:25 AM   Specimen: Nasopharyngeal Swab  Result Value Ref Range   SARS Coronavirus 2 NEGATIVE NEGATIVE    Comment: (NOTE) SARS-CoV-2 target nucleic acids are NOT DETECTED.  The SARS-CoV-2 RNA is generally detectable in upper and lower respiratory specimens during the acute phase of infection. Negative results do not preclude SARS-CoV-2 infection, do not rule out co-infections with other pathogens, and should not be used as the sole basis for treatment or other patient management decisions. Negative results must be combined with clinical observations, patient history, and epidemiological information. The expected result is Negative.  Fact Sheet for Patients: SugarRoll.be  Fact Sheet for Healthcare Providers: https://www.woods-mathews.com/  This test is not yet approved or cleared by the [place] FDA and  has been authorized for detection and/or diagnosis of SARS-CoV-2 by FDA under an Emergency Use Authorization (EUA). This EUA will remain  in effect (meaning this test can be used) for the duration of  the COVID-19 declaration under Se ction 564(b)(1) of the Act, 21 U.S.C. section 360bbb-3(b)(1), unless the authorization is terminated or revoked sooner.  Performed at [facility], [place] [address] [place], [place] [address]   SARS CORONAVIRUS 2 (TAT 6-24 HRS) Nasopharyngeal Nasopharyngeal Swab     Status: None   Collection Time: 09/05/19  2:43 PM   Specimen: Nasopharyngeal Swab  Result Value Ref Range   SARS Coronavirus 2 NEGATIVE NEGATIVE    Comment: (NOTE) SARS-CoV-2 target nucleic acids are NOT DETECTED.  The SARS-CoV-2 RNA is generally detectable in upper and lower respiratory specimens during the acute phase of infection. Negative results do not preclude SARS-CoV-2 infection, do not rule out co-infections with other pathogens, and should not be used as the sole basis for treatment or other patient management decisions. Negative results must be combined with clinical observations, patient history, and epidemiological information. The expected result is Negative.  Fact Sheet for Patients: SugarRoll.be  Fact Sheet for Healthcare Providers: https://www.woods-mathews.com/  This test is not yet approved or cleared by the [place] FDA and  has been authorized for detection and/or diagnosis of SARS-CoV-2 by FDA under an Emergency Use Authorization (EUA). This EUA will remain  in effect (meaning this test can be used) for the duration of the COVID-19 declaration under Se ction 564(b)(1) of the Act, 21 U.S.C. section 360bbb-3(b)(1), unless the authorization is terminated or revoked sooner.  Performed at [facility], [place] [address] [place], [place] [address]     Radiology: DG Chest 1 View  Result Date: 09/06/2019 CLINICAL DATA:  Post RIGHT thoracentesis EXAM: CHEST  1 VIEW COMPARISON:  Portable exam 0930 hours compared to 08/04/2019 FINDINGS: Enlargement of cardiac silhouette with mitral annular calcification.  Mediastinal contours and pulmonary vascularity normal. Atherosclerotic calcification aorta.  Moderate RIGHT pleural effusion and basilar atelectasis. No pneumothorax. LEFT lung clear. IMPRESSION: No pneumothorax following RIGHT thoracentesis. Residual RIGHT pleural effusion and basilar atelectasis. Enlargement of cardiac silhouette. Electronically Signed   By: Lavonia Dana M.D.   On: 09/06/2019 09:41   US THORACENTESIS ASP PLEURAL SPACE W/IMG GUIDE  Result Date: 09/06/2019 INDICATION: History of lung and breast cancer. Recurrent right malignant pleural effusion. Request for therapeutic thoracentesis. EXAM: ULTRASOUND GUIDED RIGHT THORACENTESIS MEDICATIONS: 1% plain lidocaine, 6 mL COMPLICATIONS: None immediate. Postprocedural chest x-ray negative for pneumothorax. PROCEDURE: An ultrasound guided thoracentesis was thoroughly discussed with the patient and questions answered. The benefits, risks, alternatives and complications were also discussed. The patient understands and wishes to proceed with the procedure. Written consent was obtained. Ultrasound was performed to localize and mark an adequate pocket of fluid in the right chest. The area was then prepped and draped in the normal sterile fashion. 1% Lidocaine was used for local anesthesia. Under ultrasound guidance a 6 Fr Safe-T-Centesis catheter was introduced. Thoracentesis was performed. The catheter was removed and a dressing applied. FINDINGS: A total of approximately 1350 mL of serosanguineous fluid was removed. IMPRESSION: Successful ultrasound guided right thoracentesis yielding 1350 mL of pleural fluid. Read by: Ascencion Dike PA-C Electronically Signed   By: Jerilynn Mages.  Shick M.D.   On: 09/06/2019 11:17    No results found.  DG Chest 1 View  Result Date: 09/06/2019 CLINICAL DATA:  Post RIGHT thoracentesis EXAM: CHEST  1 VIEW COMPARISON:  Portable exam 0930 hours compared to 08/04/2019 FINDINGS: Enlargement of cardiac silhouette with mitral annular  calcification. Mediastinal contours and pulmonary vascularity normal. Atherosclerotic calcification aorta. Moderate RIGHT pleural effusion and basilar atelectasis. No pneumothorax. LEFT lung clear. IMPRESSION: No pneumothorax following RIGHT thoracentesis. Residual RIGHT pleural effusion and basilar atelectasis. Enlargement of cardiac silhouette. Electronically Signed   By: Lavonia Dana M.D.   On: 09/06/2019 09:41   US THORACENTESIS ASP PLEURAL SPACE W/IMG GUIDE  Result Date: 09/06/2019 INDICATION: History of lung and breast cancer. Recurrent right malignant pleural effusion. Request for therapeutic thoracentesis. EXAM: ULTRASOUND GUIDED RIGHT THORACENTESIS MEDICATIONS: 1% plain lidocaine, 6 mL COMPLICATIONS: None immediate. Postprocedural chest x-ray negative for pneumothorax. PROCEDURE: An ultrasound guided thoracentesis was thoroughly discussed with the patient and questions answered. The benefits, risks, alternatives and complications were also discussed. The patient understands and wishes to proceed with the procedure. Written consent was obtained. Ultrasound was performed to localize and mark an adequate pocket of fluid in the right chest. The area was then prepped and draped in the normal sterile fashion. 1% Lidocaine was used for local anesthesia. Under ultrasound guidance a 6 Fr Safe-T-Centesis catheter was introduced. Thoracentesis was performed. The catheter was removed and a dressing applied. FINDINGS: A total of approximately 1350 mL of serosanguineous fluid was removed. IMPRESSION: Successful ultrasound guided right thoracentesis yielding 1350 mL of pleural fluid. Read by: Ascencion Dike PA-C Electronically Signed   By: Jerilynn Mages.  Shick M.D.   On: 09/06/2019 11:17      Assessment and Plan: Patient Active Problem List   Diagnosis Date Noted  . Palliative care encounter   . DVT (deep venous thrombosis) ([place]) 07/04/2019  . Goals of care, counseling/discussion 06/13/2019  . Hematoma 06/10/2019  .  Primary malignant neoplasm of right lower lobe of lung ([place]) 04/01/2019  . Neoplasm of uncertain behavior of right lower lobe of lung 03/16/2019  . Acute upper respiratory infection 12/08/2018  . Vasomotor rhinitis 12/08/2018  . Cough 09/16/2018  . Dependence on nocturnal  oxygen therapy 07/23/2018  . SOB (shortness of breath) 07/23/2018  . Lower extremity pain, bilateral 02/24/2018  . Encounter for general adult medical examination with abnormal findings 02/22/2018  . 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 Bilateral lower extremity edema 02/22/2018  . Dysuria 02/22/2018  . AAA (abdominal aortic aneurysm) without rupture ([place]) 08/23/2017  . Lymphedema 08/23/2017  . Chest pain 07/26/2017  . Tinea corporis 07/26/2017  . Obstructive chronic bronchitis without exacerbation ([place]) 02/19/2017  . Emphysema, unspecified ([place]) 02/19/2017  . Chronic obstructive pulmonary disease ([place]) 03/28/2016  . Benign hypertension 03/28/2016    1. Malignant neoplasm of right lung, unspecified part of lung (HCC) Pain well controlled, continue to monitor.   2. Chronic pain due to malignant neoplastic disease Continue with hospice management at this time   3. Encounter for preprocedure screening laboratory testing for COVID-19 Pt needs new covid test to have thoracentesis performed.  - Novel Coronavirus, NAA ([facility])  General Counseling: I have discussed the findings of the evaluation and examination with [place].  I have also discussed any further diagnostic evaluation thatmay be needed or ordered today. Charise verbalizes understanding of the findings of todays visit. We also reviewed her medications today and discussed drug interactions and side effects including but not limited excessive drowsiness and altered mental states. We also discussed that there is always a risk not just to her but also people around her.  she has been encouraged to call the office with any questions or concerns that should arise related to todays visit.  Orders Placed This Encounter  Procedures  . Novel Coronavirus, NAA ([facility])    Order Specific Question:   Is this test for diagnosis or screening    Answer:   Screening    Order Specific Question:   Symptomatic for COVID-19 as defined by CDC    Answer:   No    Order Specific Question:   Hospitalized for COVID-19    Answer:   No    Order Specific Question:   Admitted to ICU for COVID-19    Answer:   No    Order Specific Question:   Previously tested for COVID-19    Answer:   Yes    Order Specific Question:   Resident in a congregate (group) care setting    Answer:   No    Order Specific Question:   Is the patient student?    Answer:   No    Order Specific Question:   Employed in healthcare setting    Answer:   No    Order Specific Question:   Pregnant    Answer:   No    Order Specific Question:   Has patient completed COVID vaccination(s) (2 doses of Pfizer/Moderna 1 dose of [employer])    Answer:   No     Time spent: 30 This patient was seen by Orson Gear AGNP-C in Collaboration with Dr. Devona Konig as a part of collaborative care agreement.   I have personally obtained a history, examined the patient, evaluated laboratory and imaging results, formulated the assessment and plan and placed orders.    Allyne Gee, MD [facility] Pulmonary and Critical Care Sleep medicine

## 2019-09-14 NOTE — Telephone Encounter (Signed)
Spoke with daughter- she requested that the apts times for the thoracentesis be changed to 7:30 am. I sent a msg to scheduling to see if this apt time could be changed per her request

## 2019-09-14 NOTE — Telephone Encounter (Signed)
Apts changed to 8 am arrival next Tuesday. Per scheduling, patient's daughter should speak to the u/s department at the next thora apt to determine if the apt time could be changed for the next Thora. Daughter gave verbal understanding of the apt times.

## 2019-09-15 ENCOUNTER — Telehealth: Payer: Self-pay

## 2019-09-15 NOTE — Telephone Encounter (Signed)
Duplicate phone encounter opened

## 2019-09-15 NOTE — Telephone Encounter (Signed)
Confirmed and screened for 09-19-19 ov. 

## 2019-09-16 ENCOUNTER — Other Ambulatory Visit: Payer: Self-pay

## 2019-09-16 ENCOUNTER — Other Ambulatory Visit
Admission: RE | Admit: 2019-09-16 | Discharge: 2019-09-16 | Disposition: A | Discharge status: Home / Self Care | Source: Ambulatory Visit | Attending: Adult Health | Admitting: Adult Health

## 2019-09-16 DIAGNOSIS — Z01812 Encounter for preprocedural laboratory examination: Secondary | ICD-10-CM | POA: Insufficient documentation

## 2019-09-16 DIAGNOSIS — Z20822 Contact with and (suspected) exposure to covid-19: Secondary | ICD-10-CM | POA: Diagnosis not present

## 2019-09-17 LAB — SARS CORONAVIRUS 2 (TAT 6-24 HRS): SARS Coronavirus 2: NEGATIVE

## 2019-09-19 ENCOUNTER — Ambulatory Visit: Payer: Medicare Other | Admitting: Internal Medicine

## 2019-09-19 ENCOUNTER — Telehealth: Payer: Self-pay | Admitting: *Deleted

## 2019-09-19 NOTE — Telephone Encounter (Signed)
Heather/ Josh-I would recommend patient continues to stay on Eliquis given the recent history of acute significant DVT-unless there is an absolute contraindication like active bleeding.  Patient is extremely high risk of DVT/PE off anticoagulation.  GB

## 2019-09-19 NOTE — Telephone Encounter (Signed)
Patient has fairly recent history of DVT. I will defer to Dr. Rogue Bussing if okay to rotate to ASA.

## 2019-09-19 NOTE — Telephone Encounter (Signed)
Josh - please advise.

## 2019-09-19 NOTE — Telephone Encounter (Signed)
Tiffany with hospice called stating that daughter is asking if patient has to stay on  Eliquis or if she can be changed to ASA. Please advise

## 2019-09-20 ENCOUNTER — Ambulatory Visit
Admission: RE | Admit: 2019-09-20 | Discharge: 2019-09-20 | Disposition: A | Discharge status: Home / Self Care | Source: Ambulatory Visit | Attending: Interventional Radiology | Admitting: Interventional Radiology

## 2019-09-20 ENCOUNTER — Ambulatory Visit
Admission: RE | Admit: 2019-09-20 | Discharge: 2019-09-20 | Disposition: A | Discharge status: Home / Self Care | Source: Ambulatory Visit | Attending: Hospice and Palliative Medicine | Admitting: Hospice and Palliative Medicine

## 2019-09-20 ENCOUNTER — Ambulatory Visit: Payer: Medicare Other

## 2019-09-20 ENCOUNTER — Other Ambulatory Visit: Payer: Self-pay

## 2019-09-20 ENCOUNTER — Telehealth: Payer: Self-pay | Admitting: *Deleted

## 2019-09-20 ENCOUNTER — Ambulatory Visit: Admission: RE | Admit: 2019-09-20 | Source: Ambulatory Visit

## 2019-09-20 DIAGNOSIS — C7981 Secondary malignant neoplasm of breast: Secondary | ICD-10-CM | POA: Diagnosis not present

## 2019-09-20 DIAGNOSIS — C3431 Malignant neoplasm of lower lobe, right bronchus or lung: Secondary | ICD-10-CM | POA: Insufficient documentation

## 2019-09-20 DIAGNOSIS — J90 Pleural effusion, not elsewhere classified: Secondary | ICD-10-CM

## 2019-09-20 DIAGNOSIS — Z9889 Other specified postprocedural states: Secondary | ICD-10-CM | POA: Insufficient documentation

## 2019-09-20 DIAGNOSIS — J910 Malignant pleural effusion: Secondary | ICD-10-CM | POA: Diagnosis not present

## 2019-09-20 DIAGNOSIS — C50919 Malignant neoplasm of unspecified site of unspecified female breast: Secondary | ICD-10-CM | POA: Diagnosis not present

## 2019-09-20 NOTE — Telephone Encounter (Signed)
Received incoming call from specialty scheduling. Daughter spoke to ultrasound tech. She would like her mom's thoracentesis apts to be in 10 days on 8/27. She feels that her mom would benefit with a thoracentesis every 10 days rather than every other week. apts moved per daughter's request.

## 2019-09-20 NOTE — Telephone Encounter (Signed)
Agreed. I called and relayed that information to Jonelle Sidle, RN with hospice.

## 2019-09-22 ENCOUNTER — Other Ambulatory Visit: Payer: Self-pay | Admitting: Hospice and Palliative Medicine

## 2019-09-22 DIAGNOSIS — J449 Chronic obstructive pulmonary disease, unspecified: Secondary | ICD-10-CM | POA: Diagnosis not present

## 2019-09-22 DIAGNOSIS — G893 Neoplasm related pain (acute) (chronic): Secondary | ICD-10-CM

## 2019-09-22 MED ORDER — MORPHINE SULFATE 15 MG PO TABS: 15.0000 mg | ORAL_TABLET | ORAL | 0 refills | Status: DC | PRN

## 2019-09-22 NOTE — Progress Notes (Signed)
I spoke with patient's hospice nurse. Will refill MSIR 15mg  Q4H PRN #[id number]

## 2019-09-28 ENCOUNTER — Other Ambulatory Visit
Admission: RE | Admit: 2019-09-28 | Discharge: 2019-09-28 | Disposition: A | Discharge status: Home / Self Care | Source: Ambulatory Visit | Attending: Hospice and Palliative Medicine | Admitting: Hospice and Palliative Medicine

## 2019-09-28 ENCOUNTER — Other Ambulatory Visit: Payer: Self-pay

## 2019-09-28 DIAGNOSIS — Z20822 Contact with and (suspected) exposure to covid-19: Secondary | ICD-10-CM | POA: Diagnosis not present

## 2019-09-28 DIAGNOSIS — Z01812 Encounter for preprocedural laboratory examination: Secondary | ICD-10-CM | POA: Insufficient documentation

## 2019-09-28 LAB — SARS CORONAVIRUS 2 (TAT 6-24 HRS): SARS Coronavirus 2: NEGATIVE

## 2019-09-30 ENCOUNTER — Other Ambulatory Visit: Payer: Self-pay

## 2019-09-30 ENCOUNTER — Ambulatory Visit
Admission: RE | Admit: 2019-09-30 | Discharge: 2019-09-30 | Disposition: A | Discharge status: Home / Self Care | Source: Ambulatory Visit | Attending: Physician Assistant | Admitting: Physician Assistant

## 2019-09-30 ENCOUNTER — Other Ambulatory Visit: Payer: Self-pay | Admitting: Hospice and Palliative Medicine

## 2019-09-30 ENCOUNTER — Ambulatory Visit
Admission: RE | Admit: 2019-09-30 | Discharge: 2019-09-30 | Disposition: A | Discharge status: Home / Self Care | Source: Ambulatory Visit | Attending: Hospice and Palliative Medicine | Admitting: Hospice and Palliative Medicine

## 2019-09-30 DIAGNOSIS — C3431 Malignant neoplasm of lower lobe, right bronchus or lung: Secondary | ICD-10-CM

## 2019-09-30 DIAGNOSIS — J90 Pleural effusion, not elsewhere classified: Secondary | ICD-10-CM

## 2019-09-30 DIAGNOSIS — J910 Malignant pleural effusion: Secondary | ICD-10-CM | POA: Diagnosis not present

## 2019-09-30 DIAGNOSIS — C3490 Malignant neoplasm of unspecified part of unspecified bronchus or lung: Secondary | ICD-10-CM | POA: Diagnosis not present

## 2019-09-30 DIAGNOSIS — C50912 Malignant neoplasm of unspecified site of left female breast: Secondary | ICD-10-CM | POA: Diagnosis not present

## 2019-09-30 NOTE — Progress Notes (Signed)
Patient continues to refuse Pleurx. Will plan weekly thoracentesis as needed. Spoke with [place].

## 2019-09-30 NOTE — Procedures (Signed)
PROCEDURE SUMMARY:  Successful image-guided right thoracentesis. Yielded 1.0 L liters of serosanguineous fluid - procedure was aborted at this amount per patient request due to pain. Patient tolerated procedure well. EBL: Zero No immediate complications.  Specimen was not sent for labs. Post procedure CXR shows no pneumothorax.  Please see imaging section of Epic for full dictation.  Joaquim Nam PA-C 09/30/2019 9:09 AM

## 2019-10-04 ENCOUNTER — Ambulatory Visit: Payer: Medicare Other

## 2019-10-05 ENCOUNTER — Other Ambulatory Visit: Payer: Self-pay

## 2019-10-05 ENCOUNTER — Other Ambulatory Visit
Admission: RE | Admit: 2019-10-05 | Discharge: 2019-10-05 | Disposition: A | Discharge status: Home / Self Care | Source: Ambulatory Visit | Attending: Adult Health | Admitting: Adult Health

## 2019-10-05 DIAGNOSIS — Z20822 Contact with and (suspected) exposure to covid-19: Secondary | ICD-10-CM | POA: Insufficient documentation

## 2019-10-05 DIAGNOSIS — Z01812 Encounter for preprocedural laboratory examination: Secondary | ICD-10-CM | POA: Insufficient documentation

## 2019-10-05 LAB — SARS CORONAVIRUS 2 (TAT 6-24 HRS): SARS Coronavirus 2: NEGATIVE

## 2019-10-07 ENCOUNTER — Other Ambulatory Visit: Payer: Self-pay | Admitting: Student

## 2019-10-07 ENCOUNTER — Ambulatory Visit
Admission: RE | Admit: 2019-10-07 | Discharge: 2019-10-07 | Disposition: A | Discharge status: Home / Self Care | Source: Ambulatory Visit | Attending: Hospice and Palliative Medicine | Admitting: Hospice and Palliative Medicine

## 2019-10-07 ENCOUNTER — Other Ambulatory Visit: Payer: Self-pay

## 2019-10-07 ENCOUNTER — Ambulatory Visit
Admission: RE | Admit: 2019-10-07 | Discharge: 2019-10-07 | Disposition: A | Discharge status: Home / Self Care | Source: Ambulatory Visit | Attending: Student | Admitting: Student

## 2019-10-07 DIAGNOSIS — C50919 Malignant neoplasm of unspecified site of unspecified female breast: Secondary | ICD-10-CM | POA: Diagnosis not present

## 2019-10-07 DIAGNOSIS — J90 Pleural effusion, not elsewhere classified: Secondary | ICD-10-CM | POA: Diagnosis not present

## 2019-10-07 DIAGNOSIS — Z9889 Other specified postprocedural states: Secondary | ICD-10-CM

## 2019-10-07 DIAGNOSIS — C3431 Malignant neoplasm of lower lobe, right bronchus or lung: Secondary | ICD-10-CM | POA: Insufficient documentation

## 2019-10-07 DIAGNOSIS — J910 Malignant pleural effusion: Secondary | ICD-10-CM | POA: Diagnosis not present

## 2019-10-07 NOTE — Procedures (Signed)
PROCEDURE SUMMARY:  Successful US guided diagnostic and therapeutic right thoracentesis. Yielded 1.3 liters of bloody fluid. Pt tolerated procedure well. No immediate complications.  Specimen was not sent for labs. CXR ordered.  EBL < 5 mL  Docia Barrier PA-C 10/07/2019 9:56 AM

## 2019-10-12 ENCOUNTER — Other Ambulatory Visit
Admission: RE | Admit: 2019-10-12 | Discharge: 2019-10-12 | Disposition: A | Discharge status: Home / Self Care | Source: Ambulatory Visit | Attending: Hospice and Palliative Medicine | Admitting: Hospice and Palliative Medicine

## 2019-10-12 ENCOUNTER — Other Ambulatory Visit: Payer: Self-pay

## 2019-10-12 DIAGNOSIS — Z01812 Encounter for preprocedural laboratory examination: Secondary | ICD-10-CM | POA: Diagnosis not present

## 2019-10-12 DIAGNOSIS — Z20822 Contact with and (suspected) exposure to covid-19: Secondary | ICD-10-CM | POA: Diagnosis not present

## 2019-10-12 LAB — SARS CORONAVIRUS 2 (TAT 6-24 HRS): SARS Coronavirus 2: NEGATIVE

## 2019-10-14 ENCOUNTER — Ambulatory Visit
Admission: RE | Admit: 2019-10-14 | Discharge: 2019-10-14 | Disposition: A | Discharge status: Home / Self Care | Source: Ambulatory Visit | Attending: Hospice and Palliative Medicine | Admitting: Hospice and Palliative Medicine

## 2019-10-14 ENCOUNTER — Other Ambulatory Visit: Payer: Self-pay

## 2019-10-14 ENCOUNTER — Ambulatory Visit
Admission: RE | Admit: 2019-10-14 | Discharge: 2019-10-14 | Disposition: A | Discharge status: Home / Self Care | Source: Ambulatory Visit | Attending: Radiology | Admitting: Radiology

## 2019-10-14 DIAGNOSIS — J984 Other disorders of lung: Secondary | ICD-10-CM | POA: Diagnosis not present

## 2019-10-14 DIAGNOSIS — Z853 Personal history of malignant neoplasm of breast: Secondary | ICD-10-CM | POA: Diagnosis not present

## 2019-10-14 DIAGNOSIS — Z9889 Other specified postprocedural states: Secondary | ICD-10-CM | POA: Insufficient documentation

## 2019-10-14 DIAGNOSIS — J90 Pleural effusion, not elsewhere classified: Secondary | ICD-10-CM | POA: Insufficient documentation

## 2019-10-14 DIAGNOSIS — C3431 Malignant neoplasm of lower lobe, right bronchus or lung: Secondary | ICD-10-CM | POA: Insufficient documentation

## 2019-10-14 DIAGNOSIS — J910 Malignant pleural effusion: Secondary | ICD-10-CM | POA: Diagnosis not present

## 2019-10-14 DIAGNOSIS — C50919 Malignant neoplasm of unspecified site of unspecified female breast: Secondary | ICD-10-CM | POA: Diagnosis not present

## 2019-10-14 NOTE — Procedures (Signed)
Ultrasound-guided therapeutic right sided thoracentesis performed yielding 1.0 liters of sanguinous colored fluid.  Patient requested procedure stop due to pain. No immediate complications.  Follow-up chest x-ray pending. EBL is  , 2 ml.

## 2019-10-19 ENCOUNTER — Other Ambulatory Visit: Payer: Self-pay

## 2019-10-19 ENCOUNTER — Other Ambulatory Visit
Admission: RE | Admit: 2019-10-19 | Discharge: 2019-10-19 | Disposition: A | Discharge status: Home / Self Care | Source: Ambulatory Visit | Attending: Critical Care Medicine | Admitting: Critical Care Medicine

## 2019-10-19 DIAGNOSIS — Z20822 Contact with and (suspected) exposure to covid-19: Secondary | ICD-10-CM | POA: Insufficient documentation

## 2019-10-19 DIAGNOSIS — Z01812 Encounter for preprocedural laboratory examination: Secondary | ICD-10-CM | POA: Insufficient documentation

## 2019-10-20 LAB — SARS CORONAVIRUS 2 (TAT 6-24 HRS): SARS Coronavirus 2: NEGATIVE

## 2019-10-21 ENCOUNTER — Other Ambulatory Visit: Payer: Self-pay

## 2019-10-21 ENCOUNTER — Ambulatory Visit: Admission: RE | Admit: 2019-10-21 | Payer: Medicare Other | Source: Ambulatory Visit | Admitting: Radiation Oncology

## 2019-10-21 ENCOUNTER — Ambulatory Visit
Admission: RE | Admit: 2019-10-21 | Discharge: 2019-10-21 | Disposition: A | Discharge status: Home / Self Care | Source: Ambulatory Visit | Attending: Radiology | Admitting: Radiology

## 2019-10-21 ENCOUNTER — Ambulatory Visit
Admission: RE | Admit: 2019-10-21 | Discharge: 2019-10-21 | Disposition: A | Discharge status: Home / Self Care | Source: Ambulatory Visit | Attending: Hospice and Palliative Medicine | Admitting: Hospice and Palliative Medicine

## 2019-10-21 ENCOUNTER — Ambulatory Visit: Payer: Medicare Other

## 2019-10-21 DIAGNOSIS — J910 Malignant pleural effusion: Secondary | ICD-10-CM | POA: Diagnosis not present

## 2019-10-21 DIAGNOSIS — C50919 Malignant neoplasm of unspecified site of unspecified female breast: Secondary | ICD-10-CM | POA: Diagnosis not present

## 2019-10-21 DIAGNOSIS — J90 Pleural effusion, not elsewhere classified: Secondary | ICD-10-CM | POA: Insufficient documentation

## 2019-10-21 DIAGNOSIS — Z9889 Other specified postprocedural states: Secondary | ICD-10-CM | POA: Diagnosis not present

## 2019-10-21 DIAGNOSIS — C3431 Malignant neoplasm of lower lobe, right bronchus or lung: Secondary | ICD-10-CM | POA: Diagnosis not present

## 2019-10-21 NOTE — Procedures (Signed)
Ultrasound-guided therapeutic right sided thoracentesis performed yielding 1.2 liters of serosanguinous  colored fluid. Procedure terminated due to patient request. No immediate complications.  Follow-up chest x-ray pending. EBL is < 2 ml.  Thoracic pluerex cathter discussed with patient who declines at this time.

## 2019-10-23 DIAGNOSIS — J449 Chronic obstructive pulmonary disease, unspecified: Secondary | ICD-10-CM | POA: Diagnosis not present

## 2019-10-26 ENCOUNTER — Other Ambulatory Visit
Admission: RE | Admit: 2019-10-26 | Discharge: 2019-10-26 | Disposition: A | Discharge status: Home / Self Care | Source: Ambulatory Visit | Attending: Hospice and Palliative Medicine | Admitting: Hospice and Palliative Medicine

## 2019-10-26 ENCOUNTER — Other Ambulatory Visit: Payer: Self-pay

## 2019-10-26 DIAGNOSIS — Z01812 Encounter for preprocedural laboratory examination: Secondary | ICD-10-CM | POA: Insufficient documentation

## 2019-10-26 DIAGNOSIS — Z20822 Contact with and (suspected) exposure to covid-19: Secondary | ICD-10-CM | POA: Insufficient documentation

## 2019-10-26 LAB — SARS CORONAVIRUS 2 (TAT 6-24 HRS): SARS Coronavirus 2: NEGATIVE

## 2019-10-28 ENCOUNTER — Ambulatory Visit
Admission: RE | Admit: 2019-10-28 | Discharge: 2019-10-28 | Disposition: A | Discharge status: Home / Self Care | Source: Ambulatory Visit | Attending: Radiology | Admitting: Radiology

## 2019-10-28 ENCOUNTER — Other Ambulatory Visit: Payer: Self-pay | Admitting: Hospice and Palliative Medicine

## 2019-10-28 ENCOUNTER — Other Ambulatory Visit: Payer: Self-pay | Admitting: Radiology

## 2019-10-28 ENCOUNTER — Ambulatory Visit
Admission: RE | Admit: 2019-10-28 | Discharge: 2019-10-28 | Disposition: A | Discharge status: Home / Self Care | Source: Ambulatory Visit | Attending: Hospice and Palliative Medicine | Admitting: Hospice and Palliative Medicine

## 2019-10-28 ENCOUNTER — Other Ambulatory Visit: Payer: Self-pay

## 2019-10-28 DIAGNOSIS — Z9889 Other specified postprocedural states: Secondary | ICD-10-CM

## 2019-10-28 DIAGNOSIS — C3431 Malignant neoplasm of lower lobe, right bronchus or lung: Secondary | ICD-10-CM

## 2019-10-28 DIAGNOSIS — J90 Pleural effusion, not elsewhere classified: Secondary | ICD-10-CM | POA: Diagnosis not present

## 2019-10-28 NOTE — Procedures (Signed)
PROCEDURE SUMMARY:  Successful US guided right thoracentesis. Yielded 1.3 L of bloody pleural fluid. Pt tolerated procedure well. No immediate complications.  Specimen was not sent for labs. CXR ordered.  EBL < 5 mL  Ascencion Dike PA-C 10/28/2019 9:18 AM

## 2019-11-02 ENCOUNTER — Other Ambulatory Visit: Payer: Self-pay

## 2019-11-02 ENCOUNTER — Other Ambulatory Visit
Admission: RE | Admit: 2019-11-02 | Discharge: 2019-11-02 | Disposition: A | Discharge status: Home / Self Care | Source: Ambulatory Visit | Attending: Hospice and Palliative Medicine | Admitting: Hospice and Palliative Medicine

## 2019-11-02 DIAGNOSIS — Z20822 Contact with and (suspected) exposure to covid-19: Secondary | ICD-10-CM | POA: Diagnosis not present

## 2019-11-02 LAB — SARS CORONAVIRUS 2 (TAT 6-24 HRS): SARS Coronavirus 2: NEGATIVE

## 2019-11-04 ENCOUNTER — Ambulatory Visit
Admission: RE | Admit: 2019-11-04 | Discharge: 2019-11-04 | Disposition: A | Discharge status: Home / Self Care | Source: Ambulatory Visit | Attending: Hospice and Palliative Medicine | Admitting: Hospice and Palliative Medicine

## 2019-11-04 ENCOUNTER — Ambulatory Visit
Admission: RE | Admit: 2019-11-04 | Discharge: 2019-11-04 | Disposition: A | Discharge status: Home / Self Care | Source: Ambulatory Visit | Attending: Student | Admitting: Student

## 2019-11-04 ENCOUNTER — Other Ambulatory Visit: Payer: Self-pay | Admitting: Student

## 2019-11-04 ENCOUNTER — Other Ambulatory Visit: Payer: Self-pay

## 2019-11-04 ENCOUNTER — Other Ambulatory Visit: Payer: Self-pay | Admitting: Interventional Radiology

## 2019-11-04 DIAGNOSIS — J90 Pleural effusion, not elsewhere classified: Secondary | ICD-10-CM

## 2019-11-04 DIAGNOSIS — C3431 Malignant neoplasm of lower lobe, right bronchus or lung: Secondary | ICD-10-CM | POA: Diagnosis not present

## 2019-11-04 NOTE — Procedures (Signed)
PROCEDURE SUMMARY:  Successful US guided right therapeutic thoracentesis. Yielded 1.3 liters of bloody fluid. Pt tolerated procedure well. No immediate complications.  Specimen was not sent for labs. CXR ordered.  EBL < 5 mL  Docia Barrier PA-C 11/04/2019 11:09 AM

## 2019-11-07 ENCOUNTER — Ambulatory Visit: Attending: Radiation Oncology | Admitting: Radiation Oncology

## 2019-11-08 ENCOUNTER — Telehealth: Payer: Self-pay | Admitting: Hospice and Palliative Medicine

## 2019-11-08 NOTE — Telephone Encounter (Signed)
I spoke with patient's hospice nurse, Tiffany.  Reportedly patient has had a worsening pressure ulcer that is purulent, foul smelling, warm to touch, with surrounding erythema.  Daughter is requested an antibiotic and says that patient has tolerated doxycycline in the past for skin infection.  Okay to start doxycycline 100 mg twice daily x7 days and can extend an additional 7 days if needed.

## 2019-11-09 ENCOUNTER — Other Ambulatory Visit: Payer: Self-pay

## 2019-11-09 ENCOUNTER — Other Ambulatory Visit
Admission: RE | Admit: 2019-11-09 | Discharge: 2019-11-09 | Disposition: A | Discharge status: Home / Self Care | Source: Ambulatory Visit | Attending: Hospice and Palliative Medicine | Admitting: Hospice and Palliative Medicine

## 2019-11-09 DIAGNOSIS — Z01812 Encounter for preprocedural laboratory examination: Secondary | ICD-10-CM | POA: Diagnosis present

## 2019-11-09 DIAGNOSIS — Z20822 Contact with and (suspected) exposure to covid-19: Secondary | ICD-10-CM | POA: Insufficient documentation

## 2019-11-09 LAB — SARS CORONAVIRUS 2 (TAT 6-24 HRS): SARS Coronavirus 2: NEGATIVE

## 2019-11-11 ENCOUNTER — Other Ambulatory Visit: Payer: Self-pay

## 2019-11-11 ENCOUNTER — Ambulatory Visit
Admission: RE | Admit: 2019-11-11 | Discharge: 2019-11-11 | Disposition: A | Discharge status: Home / Self Care | Source: Ambulatory Visit | Attending: Hospice and Palliative Medicine | Admitting: Hospice and Palliative Medicine

## 2019-11-11 ENCOUNTER — Other Ambulatory Visit: Payer: Self-pay | Admitting: Hospice and Palliative Medicine

## 2019-11-11 DIAGNOSIS — J449 Chronic obstructive pulmonary disease, unspecified: Secondary | ICD-10-CM | POA: Insufficient documentation

## 2019-11-11 DIAGNOSIS — J90 Pleural effusion, not elsewhere classified: Secondary | ICD-10-CM | POA: Diagnosis not present

## 2019-11-11 DIAGNOSIS — Z7901 Long term (current) use of anticoagulants: Secondary | ICD-10-CM | POA: Insufficient documentation

## 2019-11-11 DIAGNOSIS — Z79899 Other long term (current) drug therapy: Secondary | ICD-10-CM | POA: Insufficient documentation

## 2019-11-11 DIAGNOSIS — C3431 Malignant neoplasm of lower lobe, right bronchus or lung: Secondary | ICD-10-CM | POA: Diagnosis not present

## 2019-11-11 DIAGNOSIS — Z86718 Personal history of other venous thrombosis and embolism: Secondary | ICD-10-CM | POA: Insufficient documentation

## 2019-11-11 NOTE — Progress Notes (Signed)
Referring Physician(s): Irean Hong  Supervising Physician: Sandi Mariscal  Patient Status:  [place] - out-pt  Chief Complaint:  [place]. Recurrent right sided pleural effusion.   Subjective:  Patient is reporting an increase in [facility].   Allergies: Augmentin [amoxicillin-pot clavulanate], Clarithromycin, Other, Oxycodone, Penicillins, and Tramadol  Medications: Prior to Admission medications   Medication Sig Start Date End Date Taking? Authorizing Provider  albuterol (PROVENTIL) (2.5 MG/3ML) 0.083% nebulizer solution Take 3 mLs (2.5 mg total) by nebulization every 6 (six) hours as needed for wheezing or shortness of breath. 03/14/19   Ronnell Freshwater, NP  apixaban (ELIQUIS) 5 MG TABS tablet Take 1 tablet (5 mg total) by mouth 2 (two) times daily. 08/10/19   Kris Hartmann, NP  budesonide (PULMICORT) 0.25 MG/2ML nebulizer solution Take 2 mLs (0.25 mg total) by nebulization 2 (two) times daily at 10 AM and 5 PM. 08/16/19   Allyne Gee, MD  carvedilol (COREG) 3.125 MG tablet Take 1 tablet (3.125 mg total) by mouth 2 (two) times daily. Patient taking differently: Take 3.125 mg by mouth daily.  09/21/18   Ronnell Freshwater, NP  cholecalciferol (VITAMIN D) 1000 units tablet Take 1,000 Units by mouth daily.    [provider]  feeding supplement, ENSURE ENLIVE, (ENSURE ENLIVE) LIQD Take 237 mLs by mouth 2 (two) times daily between meals. 07/08/19   Sheikh, Omair Latif, DO  fluticasone (FLONASE) 50 MCG/ACT nasal spray Place 2 sprays into both nostrils daily. 02/25/19   Kendell Bane, NP  Fluticasone-Salmeterol (WIXELA INHUB IN) Inhale 1 Inhaler into the lungs in the morning and at bedtime.    [provider]  formoterol (PERFOROMIST) 20 MCG/2ML nebulizer solution Take 2 mLs (20 mcg total) by nebulization 2 (two) times daily. 08/16/19   Allyne Gee, MD  gabapentin (NEURONTIN) 100 MG capsule Take 1 capsule (100 mg total) by mouth at bedtime. 08/02/19   Borders, Kirt Boys, NP    KRILL OIL PO Take by mouth.    [provider]  losartan (COZAAR) 50 MG tablet TAKE 1 TABLET(50 MG) BY MOUTH DAILY 09/08/19   Lavera Guise, MD  Manganese 10 MG TABS Take 1 tablet by mouth daily.    [provider]  morphine (MSIR) 15 MG tablet Take 1 tablet (15 mg total) by mouth every 4 (four) hours as needed for severe pain. 09/22/19   Borders, Kirt Boys, NP  ondansetron (ZOFRAN) 4 MG tablet Take 1 tablet (4 mg total) by mouth every 6 (six) hours as needed for nausea. 07/08/19   Raiford Noble Latif, DO  OXYGEN Inhale into the lungs. 2 liters at night    [provider]  potassium gluconate 595 (99 K) MG TABS tablet Take 595 mg by mouth daily.    [provider]  vitamin B-12 (CYANOCOBALAMIN) 1000 MCG tablet Take 1,000 mcg by mouth daily.    [provider]  vitamin E 400 UNIT capsule Take 400 Units by mouth daily.    [provider]     Vital Signs: There were no vitals taken for this visit.  Physical Exam Vitals and nursing note reviewed.  Constitutional:      Appearance: She is well-developed.  HENT:     Head: Normocephalic and atraumatic.  Eyes:     Conjunctiva/sclera: Conjunctivae normal.  Pulmonary:     Effort: Pulmonary effort is normal.     Comments: Baseline with nasal cannula in place Musculoskeletal:     Cervical back:  Normal range of motion.  Neurological:     Mental Status: She is alert and oriented to person, place, and time.     Imaging: No results found.  Labs:  CBC: Recent Labs    07/07/19 0800 07/08/19 0615 07/09/19 0549 07/19/19 0920  WBC 5.2 5.7 5.2 5.8  HGB 10.6* 10.6* 10.0* 10.8*  HCT 33.9* 32.6* 31.3* 33.9*  PLT 179 168 176 247    COAGS: Recent Labs    03/15/19 0959 07/04/19 1243 07/04/19 1554 07/05/19 1547 07/06/19 0159 07/06/19 2153 07/07/19 0800  INR 1.0 1.8*  --   --   --   --   --   APTT  --   --    < > >160* 119* 63* 46*   < > = values in this interval not displayed.     BMP: Recent Labs    07/07/19 0800 07/08/19 0615 07/09/19 0549 07/19/19 0920  NA 140 138 139 136  K 4.2 3.9 3.7 3.9  CL 99 97* 97* 98  CO2 31 31 31 27   GLUCOSE 106* 106* 100* 121*  BUN 13 12 12 14   CALCIUM 9.1 9.0 8.9 8.9  CREATININE 0.83 0.96 0.75 0.98  GFRNONAA >60 51* >60 50*  GFRAA >60 59* >60 58*    LIVER FUNCTION TESTS: Recent Labs    07/06/19 0159 07/07/19 0800 07/08/19 0615 07/09/19 0549  BILITOT 0.9 0.8 0.8 0.8  AST 17 16 17  14*  ALT 6 6 5  <5  ALKPHOS 53 50 49 45  PROT 6.3* 6.2* 5.8* 5.6*  ALBUMIN 3.4* 3.3* 3.2* 3.0*    Assessment and Plan:  86 y.o. female outpatient. Well known to IR. History of COPD breast cancer (left) s/p mastectomy, DVT (on eliquis), AAA s/p repair and adenocarcinoma of the RLL with recurrent right sided malignant pleural effusions. Patient is now on hospice. A thoracic pleurex has been discussed with patient who has elected not to purse procedure at this time.   Last thoracentesis was performed on 10.1.21 with 1.3 L of fluid removed. COVID - on 10.6.21. No additional recent labs. Patient is on eliquis for DVT. Allergies include codeine, Augementin, Clarithromycin and Tramadol.  Risks and benefits of thoracentesis were discussed with the patient including, but not limited to bleeding, infection, pneumothorax, and that fact that all the fluid may not be removed during today's procedure.  All of the patient's questions were answered, patient is agreeable to proceed. Consent signed and in chart.    Electronically Signed: Jacqualine Mau, NP 11/11/2019, 8:30 AM   I spent a total of 15 Minutes at the patient's bedside AND on the patient's hospital floor or unit, greater than 50% of which was counseling/coordinating care for right sided thoracentesis.

## 2019-11-11 NOTE — Procedures (Addendum)
Ultrasound-guided therapeutic right sided thoracentesis performed yielding 1.1 liters of serosanginous colored fluid. No immediate complications.  Follow-up chest x-ray pending. EBL is < 2 ml  At Patient request the procedure was terminated and no additional fluid was taken off.

## 2019-11-16 ENCOUNTER — Other Ambulatory Visit: Payer: Self-pay

## 2019-11-16 ENCOUNTER — Other Ambulatory Visit
Admission: RE | Admit: 2019-11-16 | Discharge: 2019-11-16 | Disposition: A | Discharge status: Home / Self Care | Source: Ambulatory Visit | Attending: Hospice and Palliative Medicine | Admitting: Hospice and Palliative Medicine

## 2019-11-16 DIAGNOSIS — Z20822 Contact with and (suspected) exposure to covid-19: Secondary | ICD-10-CM | POA: Insufficient documentation

## 2019-11-16 DIAGNOSIS — Z01812 Encounter for preprocedural laboratory examination: Secondary | ICD-10-CM | POA: Insufficient documentation

## 2019-11-17 LAB — SARS CORONAVIRUS 2 (TAT 6-24 HRS): SARS Coronavirus 2: NEGATIVE

## 2019-11-18 ENCOUNTER — Ambulatory Visit
Admission: RE | Admit: 2019-11-18 | Discharge: 2019-11-18 | Disposition: A | Discharge status: Home / Self Care | Source: Ambulatory Visit | Attending: Hospice and Palliative Medicine | Admitting: Hospice and Palliative Medicine

## 2019-11-18 ENCOUNTER — Other Ambulatory Visit: Payer: Self-pay | Admitting: Radiology

## 2019-11-18 ENCOUNTER — Other Ambulatory Visit: Payer: Self-pay

## 2019-11-18 ENCOUNTER — Ambulatory Visit
Admission: RE | Admit: 2019-11-18 | Discharge: 2019-11-18 | Disposition: A | Discharge status: Home / Self Care | Source: Ambulatory Visit | Attending: Radiology | Admitting: Radiology

## 2019-11-18 DIAGNOSIS — C3431 Malignant neoplasm of lower lobe, right bronchus or lung: Secondary | ICD-10-CM | POA: Diagnosis not present

## 2019-11-18 DIAGNOSIS — Z9889 Other specified postprocedural states: Secondary | ICD-10-CM

## 2019-11-18 DIAGNOSIS — J90 Pleural effusion, not elsewhere classified: Secondary | ICD-10-CM | POA: Insufficient documentation

## 2019-11-18 NOTE — Procedures (Addendum)
Ultrasound-guided  therapeutic right sided thoracentesis performed yielding 1.1 liters of serosanginous colored fluid. No immediate complications.  Follow-up chest x-ray pending. EBL is < 62ml. Procedure termination per patient request.    Addendum 10.15.21 . Post thoracentesis chest xray reviewed by Dr.Henn with possible small locuated pleural effusion noted. Patient vital signs are WNL and patient is requesting to go home. Okay to discharge per Dr. Anselm Pancoast.

## 2019-11-22 DIAGNOSIS — J449 Chronic obstructive pulmonary disease, unspecified: Secondary | ICD-10-CM | POA: Diagnosis not present

## 2019-11-23 ENCOUNTER — Other Ambulatory Visit: Payer: Self-pay | Admitting: Internal Medicine

## 2019-11-23 ENCOUNTER — Other Ambulatory Visit
Admission: RE | Admit: 2019-11-23 | Discharge: 2019-11-23 | Disposition: A | Discharge status: Home / Self Care | Source: Ambulatory Visit | Attending: Hospice and Palliative Medicine | Admitting: Hospice and Palliative Medicine

## 2019-11-23 ENCOUNTER — Other Ambulatory Visit: Payer: Self-pay

## 2019-11-23 DIAGNOSIS — Z20822 Contact with and (suspected) exposure to covid-19: Secondary | ICD-10-CM | POA: Diagnosis not present

## 2019-11-23 DIAGNOSIS — Z01812 Encounter for preprocedural laboratory examination: Secondary | ICD-10-CM | POA: Insufficient documentation

## 2019-11-24 LAB — SARS CORONAVIRUS 2 (TAT 6-24 HRS): SARS Coronavirus 2: NEGATIVE

## 2019-11-25 ENCOUNTER — Ambulatory Visit
Admission: RE | Admit: 2019-11-25 | Discharge: 2019-11-25 | Disposition: A | Discharge status: Home / Self Care | Source: Ambulatory Visit | Attending: Hospice and Palliative Medicine | Admitting: Hospice and Palliative Medicine

## 2019-11-25 ENCOUNTER — Other Ambulatory Visit: Payer: Self-pay | Admitting: Student

## 2019-11-25 ENCOUNTER — Other Ambulatory Visit: Payer: Self-pay

## 2019-11-25 ENCOUNTER — Ambulatory Visit
Admission: RE | Admit: 2019-11-25 | Discharge: 2019-11-25 | Disposition: A | Discharge status: Home / Self Care | Source: Ambulatory Visit | Attending: Student | Admitting: Student

## 2019-11-25 DIAGNOSIS — Z9889 Other specified postprocedural states: Secondary | ICD-10-CM

## 2019-11-25 DIAGNOSIS — J910 Malignant pleural effusion: Secondary | ICD-10-CM | POA: Diagnosis not present

## 2019-11-25 DIAGNOSIS — C3431 Malignant neoplasm of lower lobe, right bronchus or lung: Secondary | ICD-10-CM | POA: Diagnosis not present

## 2019-11-25 DIAGNOSIS — J90 Pleural effusion, not elsewhere classified: Secondary | ICD-10-CM

## 2019-11-25 NOTE — Procedures (Signed)
PROCEDURE SUMMARY:  Successful US guided right thoracentesis. Yielded 1.1 L of dark red fluid. Pt tolerated procedure well. No immediate complications.  CXR ordered; no post-procedure pneumothorax identified.   EBL <  2 mL  Theresa Duty, NP 11/25/2019 11:44 AM

## 2019-11-29 ENCOUNTER — Other Ambulatory Visit: Payer: Self-pay | Admitting: Hospice and Palliative Medicine

## 2019-11-29 MED ORDER — HYDROCODONE-ACETAMINOPHEN 5-325 MG PO TABS: 1.0000 | ORAL_TABLET | Freq: Four times a day (QID) | ORAL | 0 refills | Status: AC | PRN

## 2019-11-29 NOTE — Progress Notes (Signed)
I spoke with patient's hospice nurse.  Patient no longer wants to take MSIR as she does not like the way it makes her feel.  Patient requested that we restart Norco for pain.  Patient is having pain from pressure ulcers on her sacrum.  Will send Rx for Norco.

## 2019-11-30 ENCOUNTER — Other Ambulatory Visit

## 2019-12-02 ENCOUNTER — Ambulatory Visit: Admission: RE | Admit: 2019-12-02 | Payer: Medicare Other | Source: Ambulatory Visit

## 2019-12-05 DEATH — deceased

## 2020-05-02 ENCOUNTER — Telehealth: Payer: Self-pay | Admitting: Internal Medicine

## 2020-05-02 NOTE — Progress Notes (Signed)
  Chronic Care Management   Outreach Note  05/02/2020 Name: ALYISSA WHIDBEE MRN: [id number] DOB: 1923/02/05  Referred by: Lavera Guise, MD Reason for referral : No chief complaint on file.   An unsuccessful telephone outreach was attempted today. The patient was referred to the pharmacist for assistance with care management and care coordination.   Follow Up Plan:   Carley Perdue [employer] Scheduler

## 2020-07-04 ENCOUNTER — Telehealth: Payer: Self-pay

## 2020-07-04 NOTE — Telephone Encounter (Signed)
Per daughter Melton Alar, pt is deceased as of 12/05/2019

## 2021-03-31 IMAGING — US US THORACENTESIS ASP PLEURAL SPACE W/IMG GUIDE
1 series · 3 of 3 positions shown · non-contrast
Comparison: CTA of the chest on 07/04/2019

CLINICAL DATA: Malignant right pleural effusion.

EXAM:
ULTRASOUND GUIDED RIGHT THORACENTESIS

[Series 1: us thoracentesis asp pleural space w/img guide · 0.33mm/px · 3 of 3 slices shown]
[im 1/3]
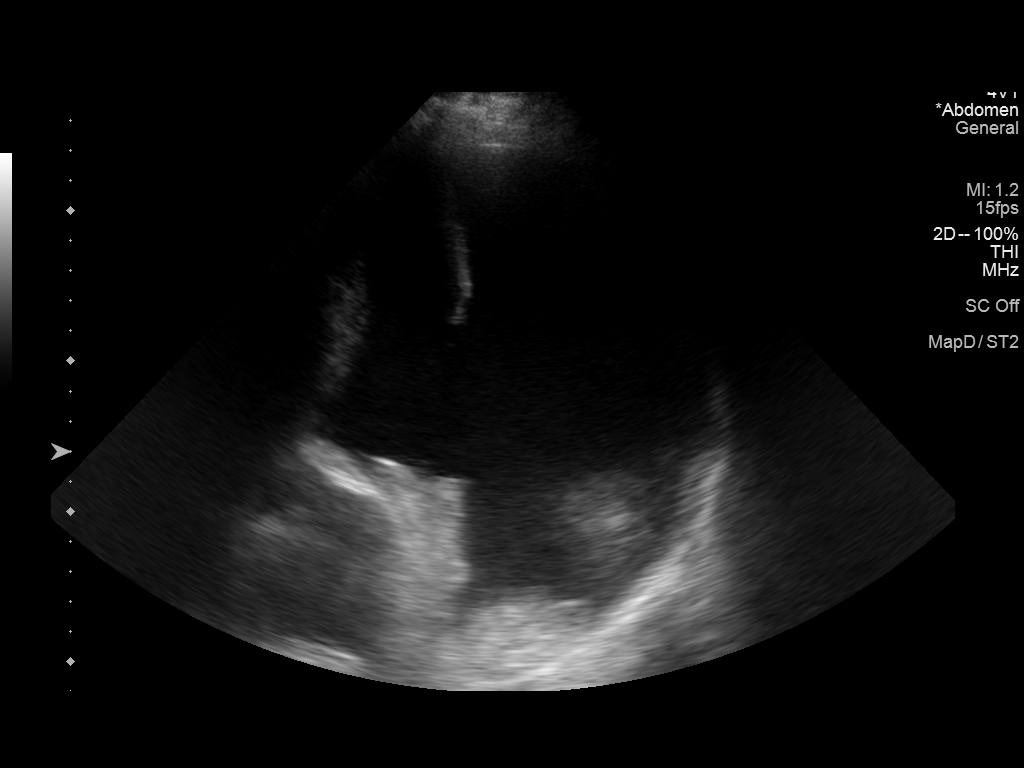
[im 2/3]
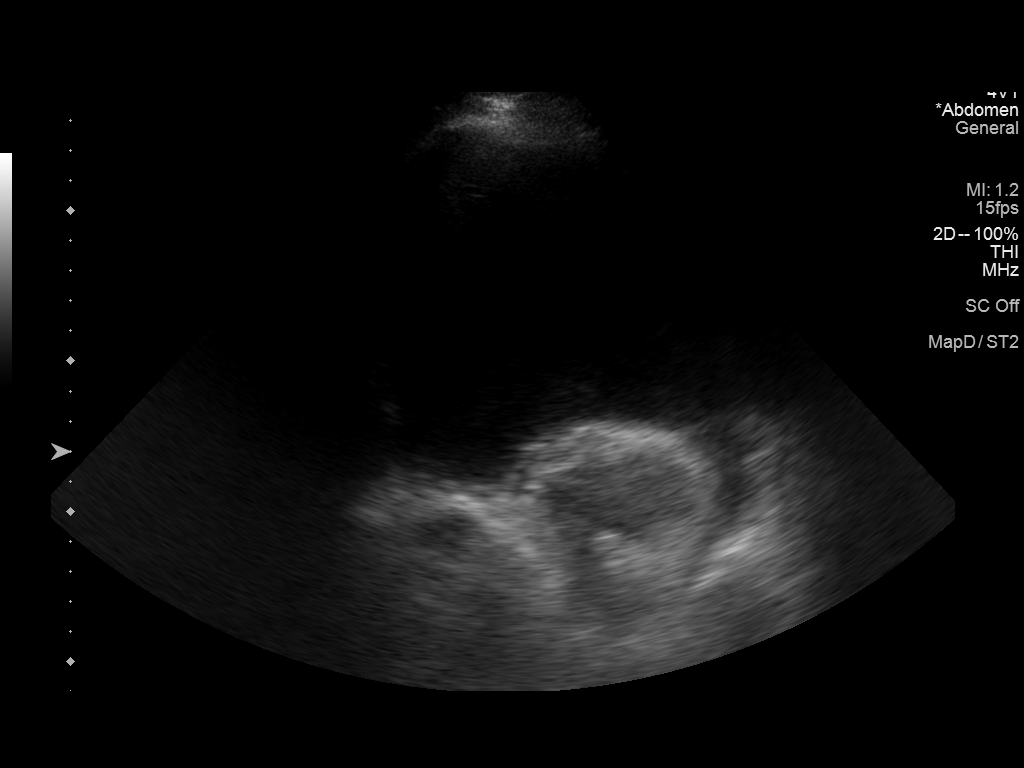
[im 3/3]
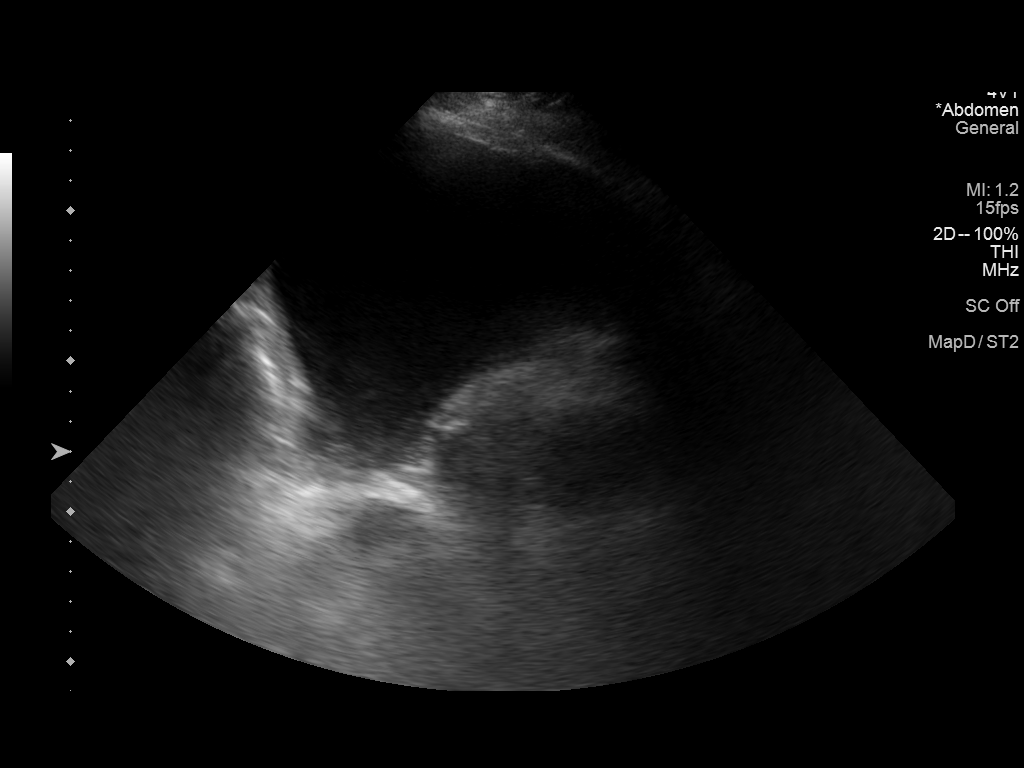

[3 of 3 positions shown; findings below may reference images not displayed]

PROCEDURE:
An ultrasound guided thoracentesis was thoroughly discussed with the
patient and questions answered. The benefits, risks, alternatives
and complications were also discussed. The patient understands and
wishes to proceed with the procedure. Written consent was obtained.

Ultrasound was performed to localize and mark an adequate pocket of
fluid in the right chest. The area was then prepped and draped in
the normal sterile fashion. 1% Lidocaine was used for local
anesthesia. Under ultrasound guidance a 6 French Safe-T-Centesis
catheter was introduced. Thoracentesis was performed. The catheter
was removed and a dressing applied.

COMPLICATIONS:
None
FINDINGS: A total of approximately 1.2 L of bloody fluid was removed.
IMPRESSION: Successful ultrasound guided right thoracentesis yielding 1.2 L of
pleural fluid.

## 2021-04-15 IMAGING — US US THORACENTESIS ASP PLEURAL SPACE W/IMG GUIDE
1 series · 2 of 2 positions shown · non-contrast
Comparison: none

INDICATION: Recurrent right malignant pleural effusion, shortness of breath

[Series 1: us thoracentesis asp pleural space w/img guide · 2 of 2 slices shown]
[im 1/2]
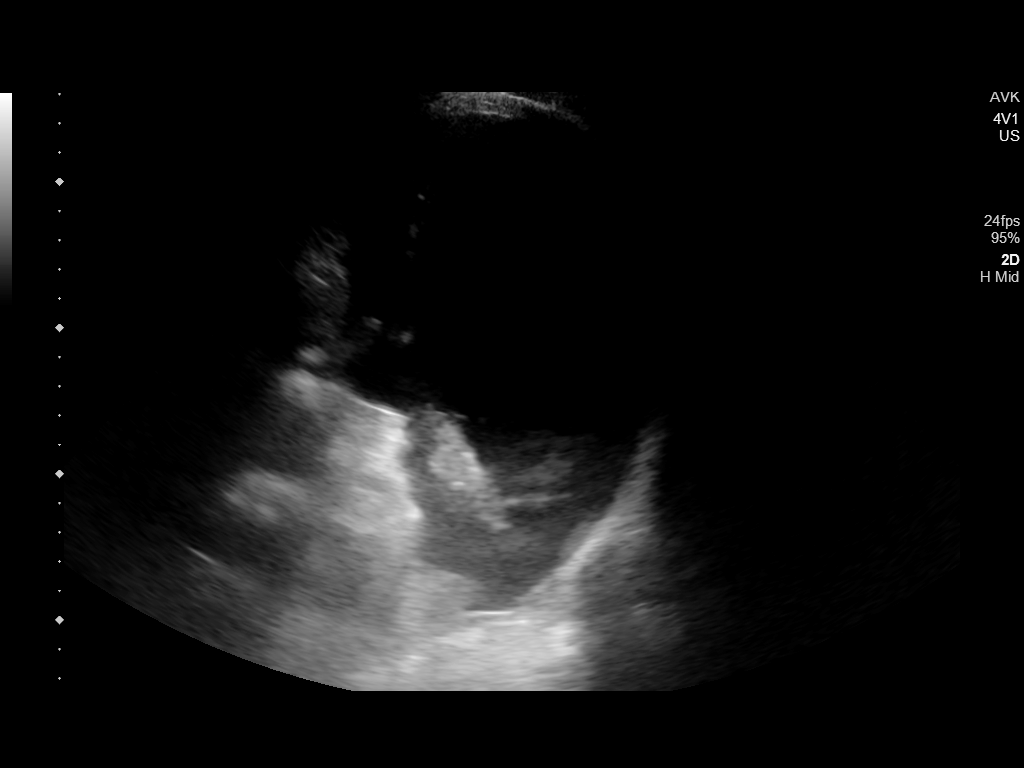
[im 2/2]
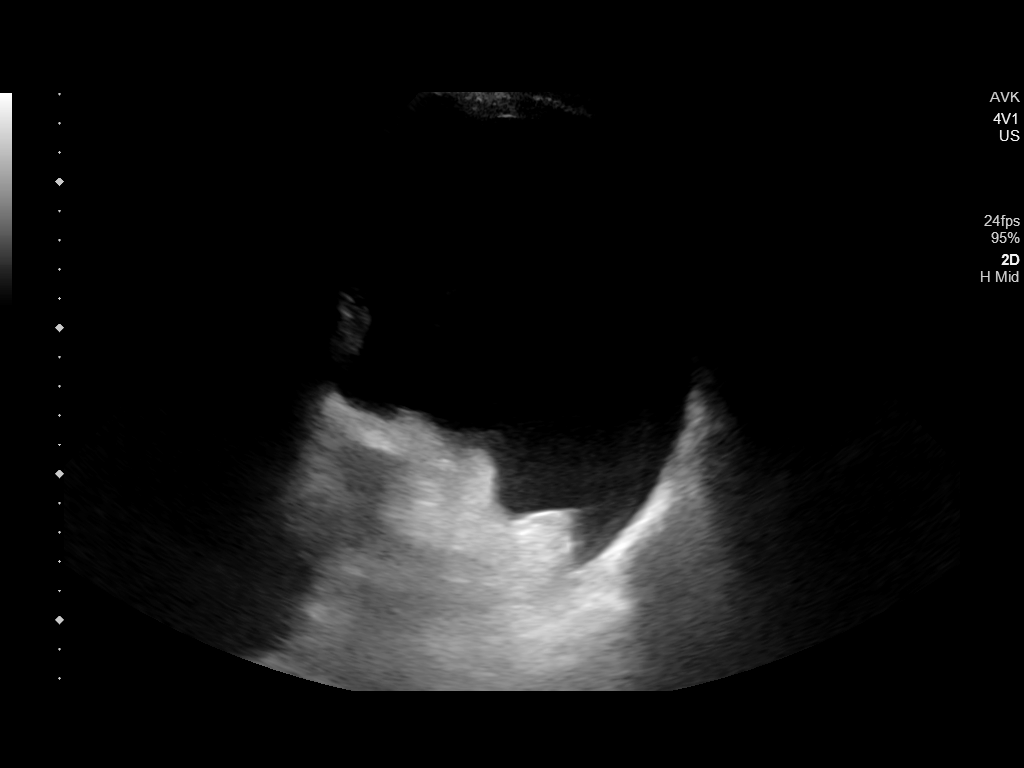

[2 of 2 positions shown; findings below may reference images not displayed]

EXAM:
ULTRASOUND GUIDED RIGHT THORACENTESIS

MEDICATIONS:
1% lidocaine local

COMPLICATIONS:
None immediate.

PROCEDURE:
An ultrasound guided thoracentesis was thoroughly discussed with the
patient and questions answered. The benefits, risks, alternatives
and complications were also discussed. The patient understands and
wishes to proceed with the procedure. Written consent was obtained.

Ultrasound was performed to localize and mark an adequate pocket of
fluid in the right chest. The area was then prepped and draped in
the normal sterile fashion. 1% Lidocaine was used for local
anesthesia. Under ultrasound guidance a 6 Fr Safe-T-Centesis
catheter was introduced. Thoracentesis was performed. The catheter
was removed and a dressing applied.
FINDINGS: A total of approximately 1 L of bloody pleural fluid was removed.
IMPRESSION: Successful ultrasound guided right thoracentesis yielding 1 L of
pleural fluid.

## 2021-04-26 IMAGING — CR DG ABDOMEN 1V
1 series · 3 of 3 positions shown · non-contrast
Comparison: Chest radiograph July 22, 2019; PET-CT June 09, 2019

CLINICAL DATA: Acute abdominal pain

EXAM:
ABDOMEN - 1 VIEW

[Series 1: dg abd 2 views · 0.14mm/px · 3 of 3 slices shown]
[im 1/3]
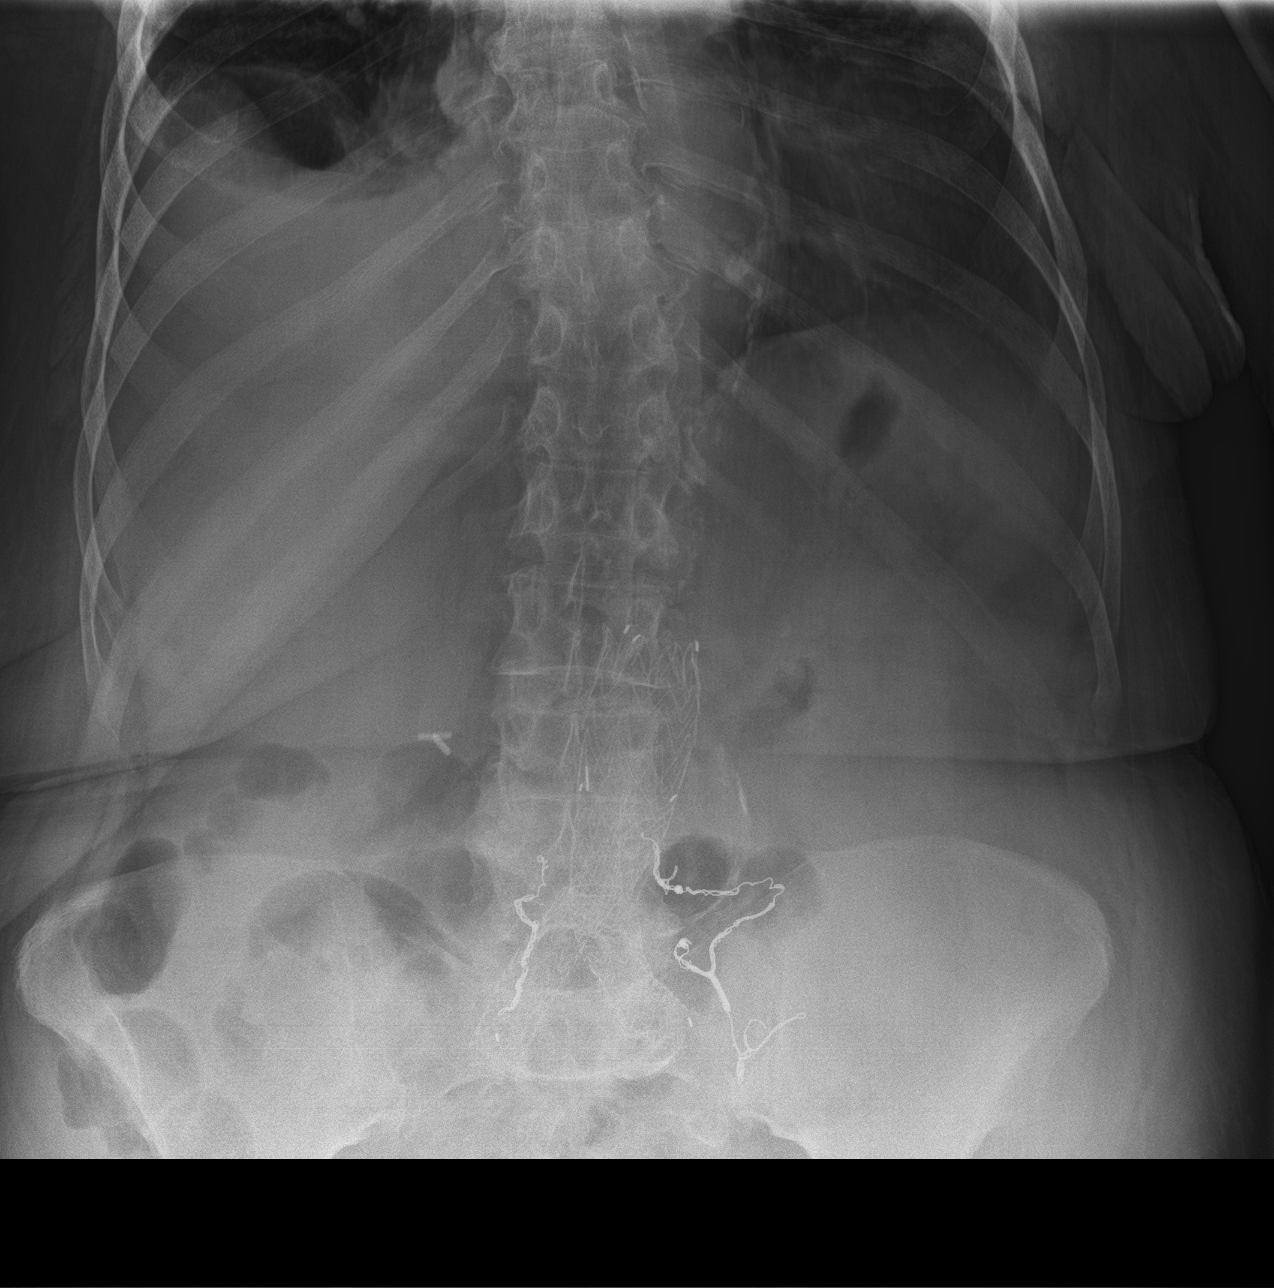
[im 2/3]
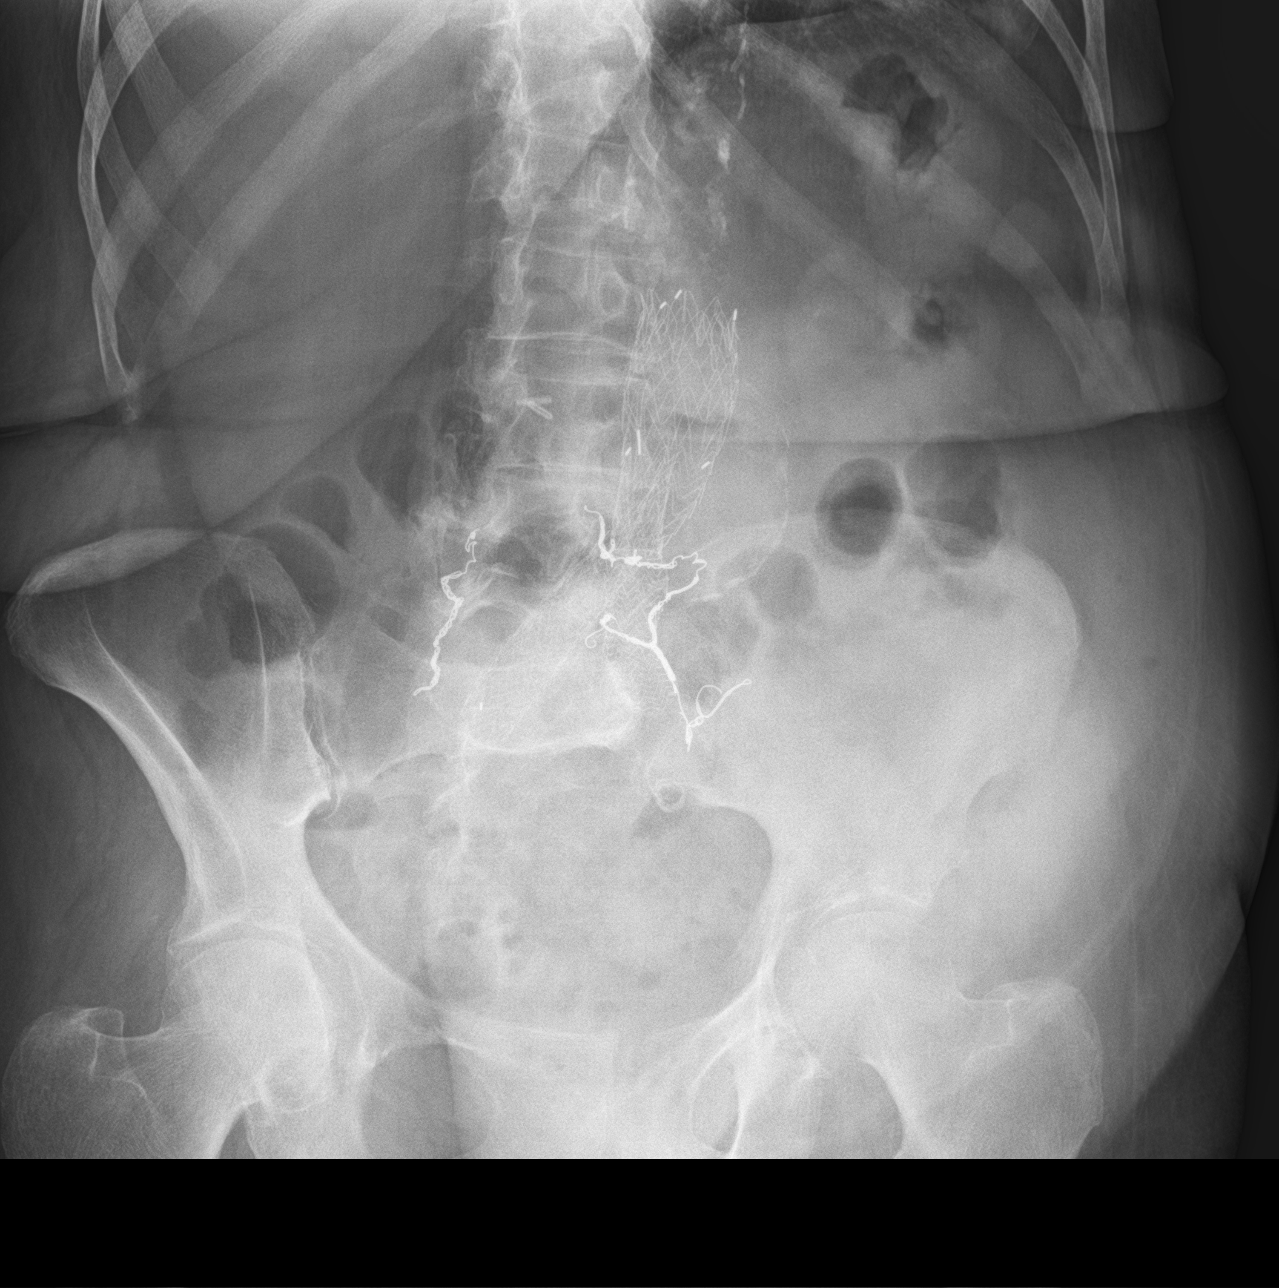
[im 3/3]
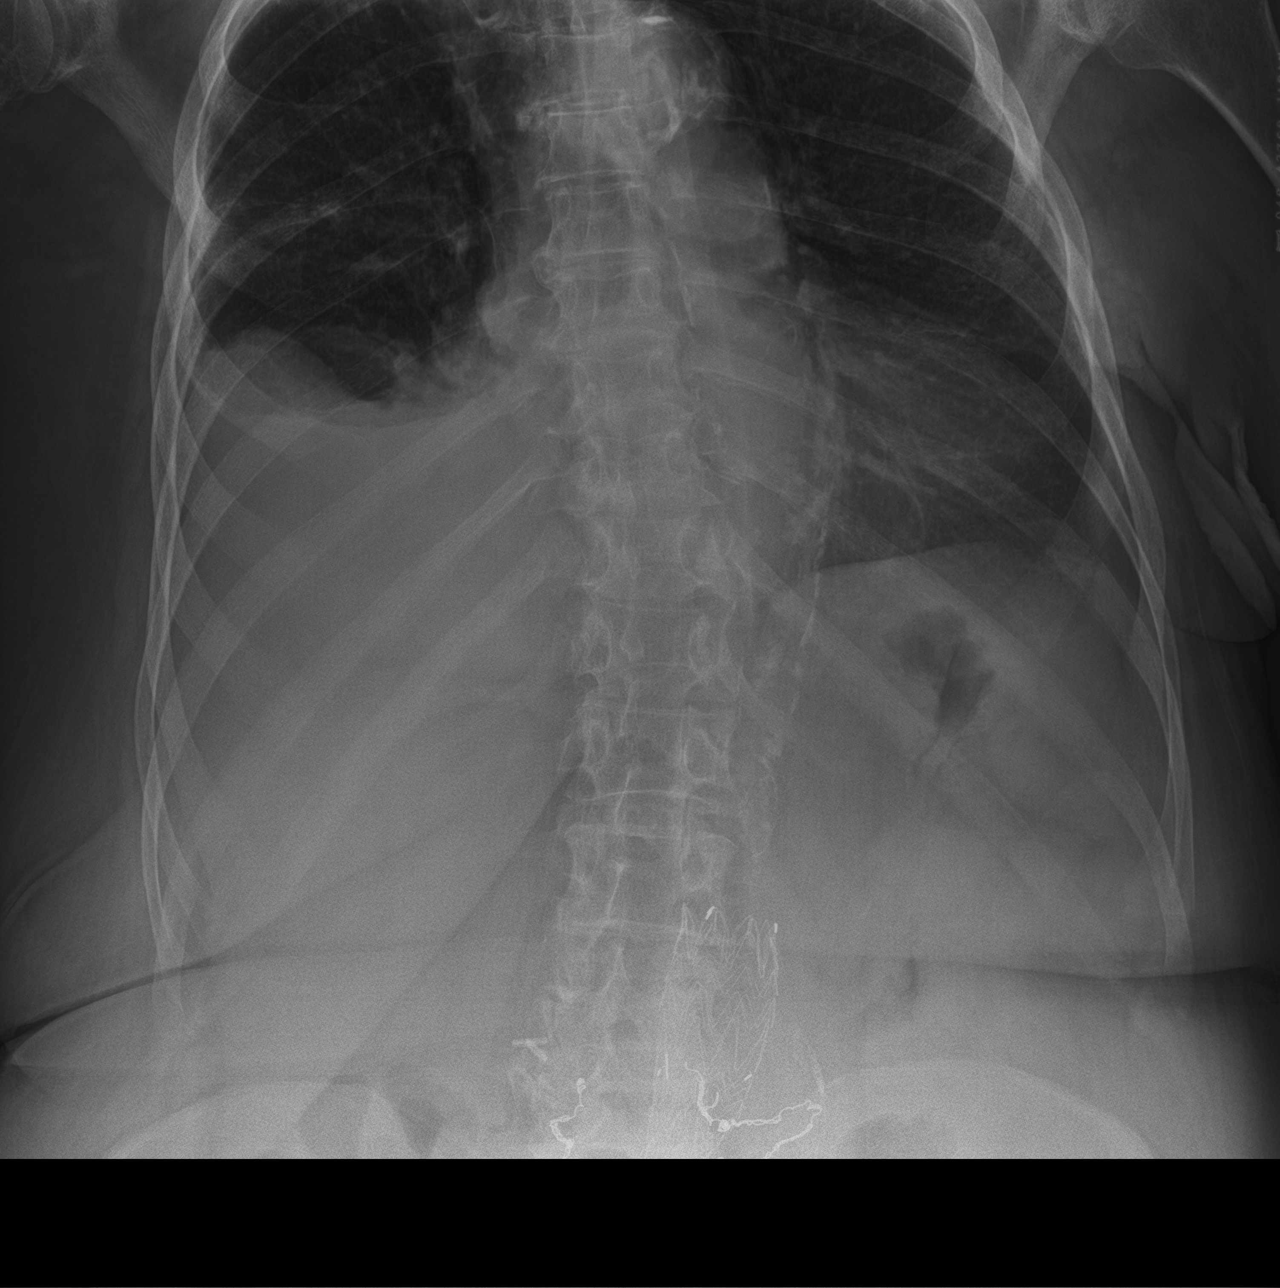

[3 of 3 positions shown; findings below may reference images not displayed]

FINDINGS: Upright images obtained. There is moderate stool in the colon. There
is no bowel dilatation or air-fluid level to suggest bowel
obstruction. No free air.

There is asymmetric soft tissue opacity lateral to the left iliac
crest of uncertain etiology.

There is postoperative change in the area of known abdominal aortic
aneurysm with stent in this area. This area appears grossly stable.

Right pleural effusion with apparent right base mass lesion again
noted.
IMPRESSION: 1.  No bowel obstruction or free air.

2. Asymmetric soft tissue opacity lateral to the left iliac crest of
uncertain etiology. CT potentially could be helpful to further
evaluate this area if there is concern for pathology in this region.

3. Stent placement through known abdominal aortic aneurysm again
noted.

4. Right pleural effusion with apparent mass right base, documented
on previous PET study.

## 2021-04-28 IMAGING — DX DG CHEST 1V PORT
1 series · 1 of 1 positions shown · non-contrast
Comparison: 07/22/2019.

CLINICAL DATA: Post thoracentesis.

EXAM:
PORTABLE CHEST 1 VIEW

[chest ap]
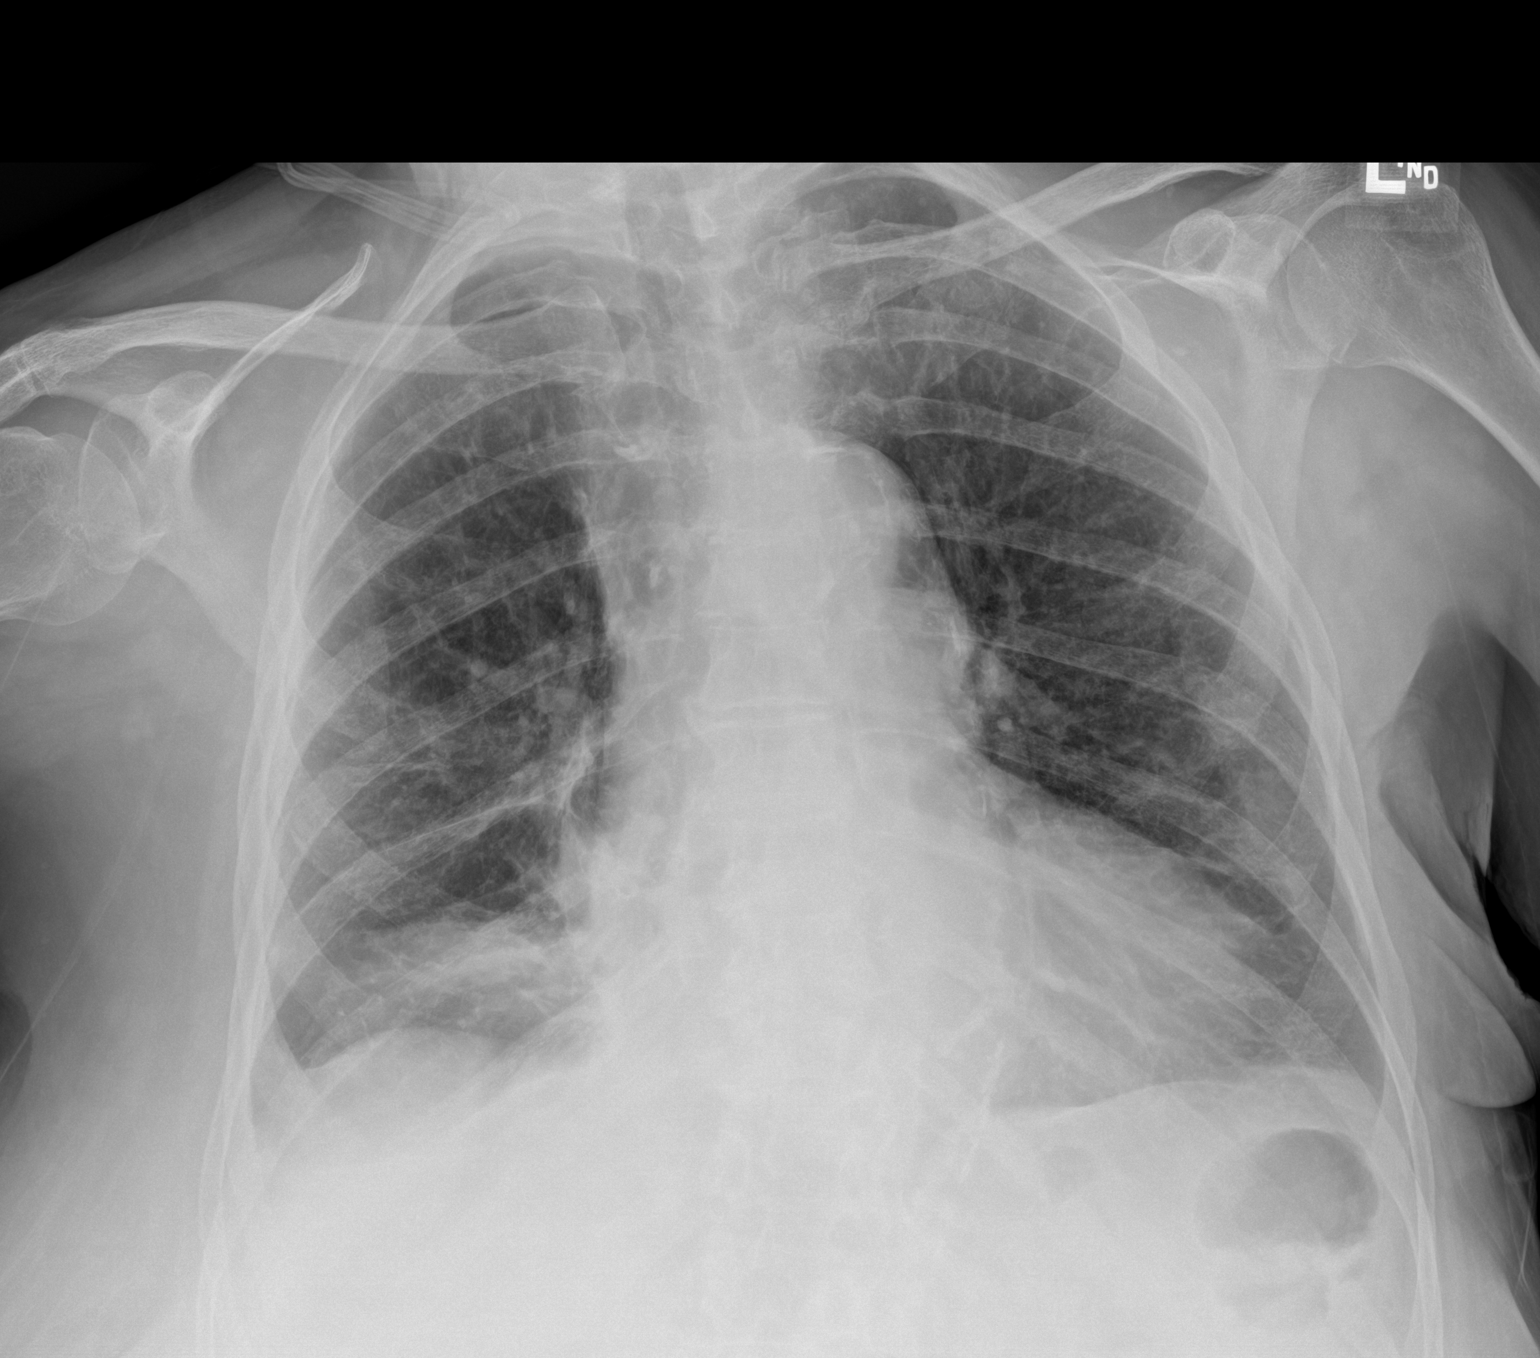

[1 of 1 positions shown; findings below may reference images not displayed]

FINDINGS: Mediastinum hilar structures normal. Cardiomegaly. Stable mild
bilateral interstitial prominence. Tiny right pleural effusion. No
prominent effusion noted. No pneumothorax post thoracentesis.
IMPRESSION: No pneumothorax post thoracentesis.

## 2021-07-01 IMAGING — US US THORACENTESIS ASP PLEURAL SPACE W/IMG GUIDE
1 series · 3 of 3 positions shown · non-contrast
Comparison: none

INDICATION: Patient with history of breast cancer, recurrent malignant pleural
effusion. Request made for diagnostic and therapeutic right
thoracentesis.

[Series 1: us thoracentesis asp pleural space w/img guide · 3 of 3 slices shown]
[im 1/3]
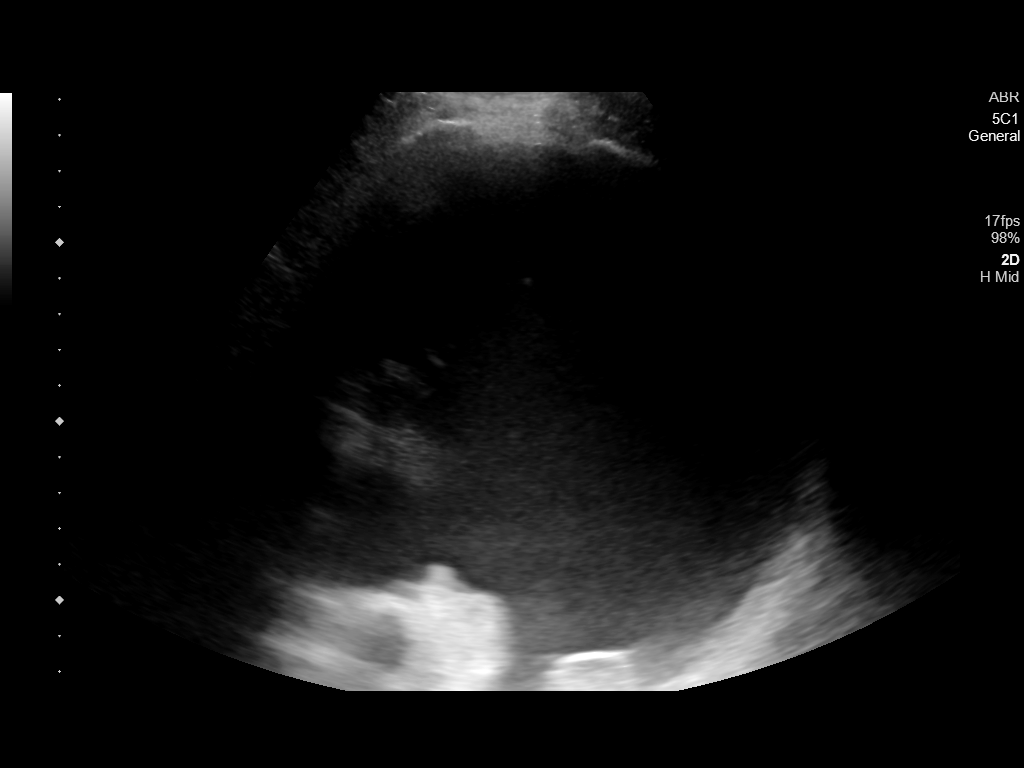
[im 2/3]
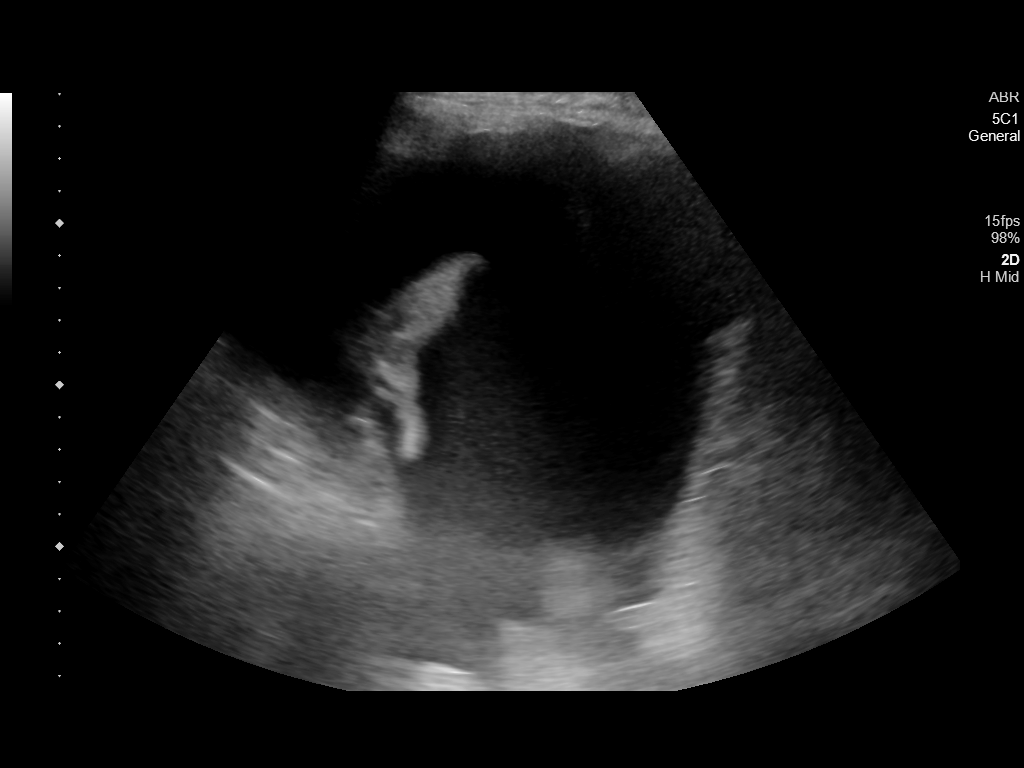
[im 3/3]
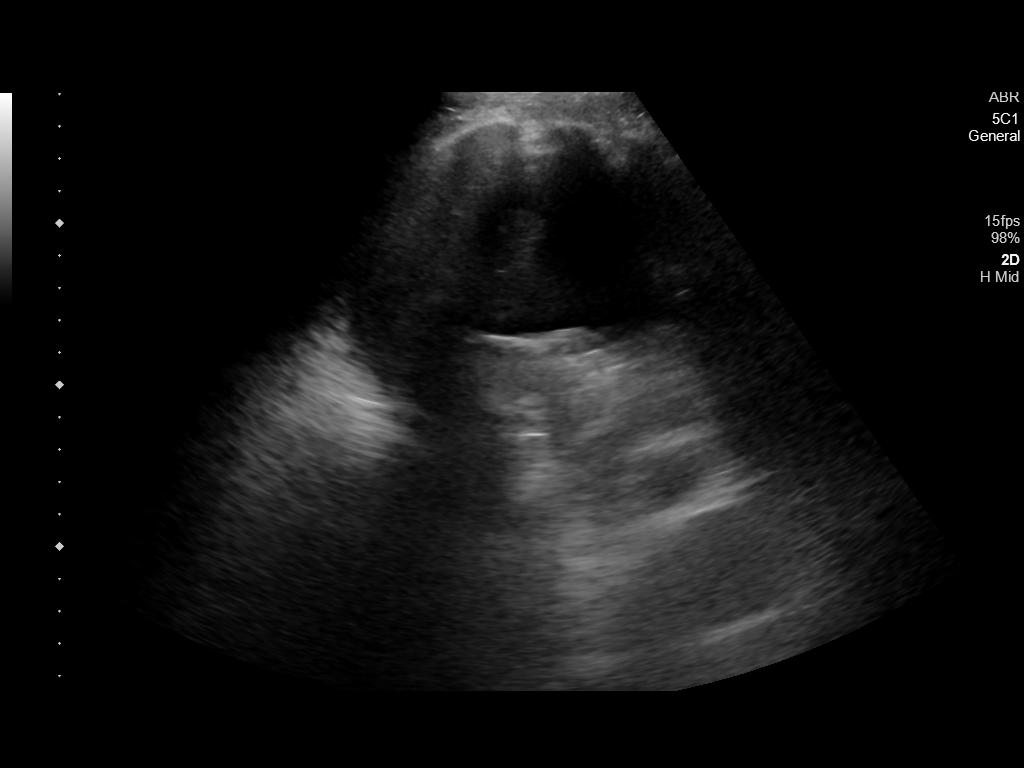

[3 of 3 positions shown; findings below may reference images not displayed]

EXAM:
ULTRASOUND GUIDED THERAPEUTIC RIGHT THORACENTESIS

MEDICATIONS:
10 mL 1% lidocaine

COMPLICATIONS:
None immediate.

PROCEDURE:
An ultrasound guided thoracentesis was thoroughly discussed with the
patient and questions answered. The benefits, risks, alternatives
and complications were also discussed. The patient understands and
wishes to proceed with the procedure. Written consent was obtained.

Ultrasound was performed to localize and mark an adequate pocket of
fluid in the right chest. The area was then prepped and draped in
the normal sterile fashion. 1% Lidocaine was used for local
anesthesia. Under ultrasound guidance a 6 Fr Safe-T-Centesis
catheter was introduced. Thoracentesis was performed. The catheter
was removed and a dressing applied.
FINDINGS: A total of approximately 1.3 liters of bloody fluid was removed.
IMPRESSION: Successful ultrasound guided therapeutic right thoracentesis
yielding 1.3 liters of pleural fluid.

## 2023-11-04 NOTE — Progress Notes (Signed)
 This encounter was created in error - please disregard.
